# Patient Record
Sex: Male | Born: 1960 | Hispanic: Yes | Smoking: Never smoker
Health system: Southern US, Community
[De-identification: ages and names within clinical notes are randomized; demographics above are authoritative.]

## PROBLEM LIST (undated history)

## (undated) DIAGNOSIS — T8859XA Other complications of anesthesia, initial encounter: Secondary | ICD-10-CM

## (undated) DIAGNOSIS — R112 Nausea with vomiting, unspecified: Secondary | ICD-10-CM

## (undated) DIAGNOSIS — F32A Depression, unspecified: Secondary | ICD-10-CM

## (undated) DIAGNOSIS — E7800 Pure hypercholesterolemia, unspecified: Secondary | ICD-10-CM

## (undated) DIAGNOSIS — F419 Anxiety disorder, unspecified: Secondary | ICD-10-CM

## (undated) DIAGNOSIS — Z9889 Other specified postprocedural states: Secondary | ICD-10-CM

## (undated) DIAGNOSIS — F329 Major depressive disorder, single episode, unspecified: Secondary | ICD-10-CM

## (undated) DIAGNOSIS — I10 Essential (primary) hypertension: Secondary | ICD-10-CM

## (undated) DIAGNOSIS — M1990 Unspecified osteoarthritis, unspecified site: Secondary | ICD-10-CM

## (undated) DIAGNOSIS — K2970 Gastritis, unspecified, without bleeding: Secondary | ICD-10-CM

## (undated) DIAGNOSIS — R7303 Prediabetes: Secondary | ICD-10-CM

## (undated) DIAGNOSIS — K219 Gastro-esophageal reflux disease without esophagitis: Secondary | ICD-10-CM

## (undated) DIAGNOSIS — J40 Bronchitis, not specified as acute or chronic: Secondary | ICD-10-CM

## (undated) DIAGNOSIS — T4145XA Adverse effect of unspecified anesthetic, initial encounter: Secondary | ICD-10-CM

---

## 1999-03-01 ENCOUNTER — Encounter: Admission: RE | Admit: 1999-03-01 | Discharge: 1999-03-05 | Payer: Self-pay | Admitting: Family Medicine

## 2004-10-01 ENCOUNTER — Ambulatory Visit: Payer: Self-pay | Admitting: Family Medicine

## 2004-10-06 ENCOUNTER — Ambulatory Visit (HOSPITAL_COMMUNITY): Admission: RE | Admit: 2004-10-06 | Discharge: 2004-10-06 | Payer: Self-pay | Admitting: Family Medicine

## 2004-10-29 ENCOUNTER — Ambulatory Visit: Payer: Self-pay | Admitting: Family Medicine

## 2005-01-17 ENCOUNTER — Ambulatory Visit: Payer: Self-pay | Admitting: Family Medicine

## 2005-01-26 ENCOUNTER — Ambulatory Visit: Payer: Self-pay | Admitting: Family Medicine

## 2005-02-21 HISTORY — PX: ABDOMINAL SURGERY: SHX537

## 2005-03-11 ENCOUNTER — Ambulatory Visit: Payer: Self-pay | Admitting: Internal Medicine

## 2005-03-14 ENCOUNTER — Ambulatory Visit: Payer: Self-pay | Admitting: Internal Medicine

## 2005-03-14 ENCOUNTER — Ambulatory Visit (HOSPITAL_COMMUNITY): Admission: RE | Admit: 2005-03-14 | Discharge: 2005-03-14 | Payer: Self-pay | Admitting: Internal Medicine

## 2005-04-05 ENCOUNTER — Ambulatory Visit: Payer: Self-pay | Admitting: Family Medicine

## 2005-04-25 ENCOUNTER — Ambulatory Visit: Payer: Self-pay | Admitting: Internal Medicine

## 2005-05-05 ENCOUNTER — Ambulatory Visit: Payer: Self-pay | Admitting: Family Medicine

## 2005-09-02 ENCOUNTER — Ambulatory Visit: Payer: Self-pay | Admitting: Internal Medicine

## 2005-09-27 ENCOUNTER — Ambulatory Visit: Payer: Self-pay | Admitting: Internal Medicine

## 2005-09-27 ENCOUNTER — Ambulatory Visit (HOSPITAL_COMMUNITY): Admission: RE | Admit: 2005-09-27 | Discharge: 2005-09-27 | Payer: Self-pay | Admitting: Internal Medicine

## 2005-09-30 ENCOUNTER — Ambulatory Visit (HOSPITAL_COMMUNITY): Admission: RE | Admit: 2005-09-30 | Discharge: 2005-09-30 | Payer: Self-pay | Admitting: *Deleted

## 2005-10-06 ENCOUNTER — Ambulatory Visit (HOSPITAL_COMMUNITY): Admission: RE | Admit: 2005-10-06 | Discharge: 2005-10-06 | Payer: Self-pay | Admitting: Internal Medicine

## 2006-03-04 ENCOUNTER — Emergency Department (HOSPITAL_COMMUNITY): Admission: EM | Admit: 2006-03-04 | Discharge: 2006-03-04 | Payer: Self-pay | Admitting: Emergency Medicine

## 2006-03-05 ENCOUNTER — Ambulatory Visit (HOSPITAL_COMMUNITY): Admission: RE | Admit: 2006-03-05 | Discharge: 2006-03-05 | Payer: Self-pay | Admitting: Emergency Medicine

## 2006-04-20 ENCOUNTER — Ambulatory Visit: Payer: Self-pay | Admitting: Internal Medicine

## 2006-05-10 ENCOUNTER — Encounter (INDEPENDENT_AMBULATORY_CARE_PROVIDER_SITE_OTHER): Payer: Self-pay | Admitting: Specialist

## 2006-05-10 ENCOUNTER — Ambulatory Visit: Payer: Self-pay | Admitting: Internal Medicine

## 2006-05-10 ENCOUNTER — Ambulatory Visit (HOSPITAL_COMMUNITY): Admission: RE | Admit: 2006-05-10 | Discharge: 2006-05-10 | Payer: Self-pay | Admitting: Internal Medicine

## 2006-05-24 ENCOUNTER — Ambulatory Visit: Payer: Self-pay | Admitting: Internal Medicine

## 2006-06-16 ENCOUNTER — Ambulatory Visit: Payer: Self-pay | Admitting: Internal Medicine

## 2006-07-13 ENCOUNTER — Ambulatory Visit: Payer: Self-pay | Admitting: Internal Medicine

## 2006-07-18 ENCOUNTER — Ambulatory Visit (HOSPITAL_COMMUNITY): Admission: RE | Admit: 2006-07-18 | Discharge: 2006-07-18 | Payer: Self-pay | Admitting: Internal Medicine

## 2007-07-11 ENCOUNTER — Emergency Department (HOSPITAL_COMMUNITY): Admission: EM | Admit: 2007-07-11 | Discharge: 2007-07-12 | Payer: Self-pay | Admitting: Emergency Medicine

## 2007-07-11 ENCOUNTER — Ambulatory Visit: Payer: Self-pay | Admitting: Psychiatry

## 2007-07-12 ENCOUNTER — Inpatient Hospital Stay (HOSPITAL_COMMUNITY): Admission: RE | Admit: 2007-07-12 | Discharge: 2007-07-18 | Payer: Self-pay | Admitting: Psychiatry

## 2007-12-28 IMAGING — CT CT ABDOMEN W/ CM
3 of 7 series · 15 of 46 positions shown, 17 images · IV contrast (Omnipaque 300)
Comparison: None.

CLINICAL DATA: 45-year-old with right upper abdominal wall mass Pain for two weeks.
 CHEST CT WITH CONTRAST:
TECHNIQUE: Multidetector CT imaging of the chest was performed following the standard protocol during bolus administration of intravenous contrast.
 Contrast:  100 cc Omnipaque 300.
TECHNIQUE: Multidetector CT imaging of the abdomen was performed following the standard protocol during bolus administration of intravenous contrast.

[Series 2: chestroutine 5.0 b40f · axial · 0.74mm/px · z∈[-86,-52]mm · 2 of 48 slices shown]
[im 7/48  soft-tissue]
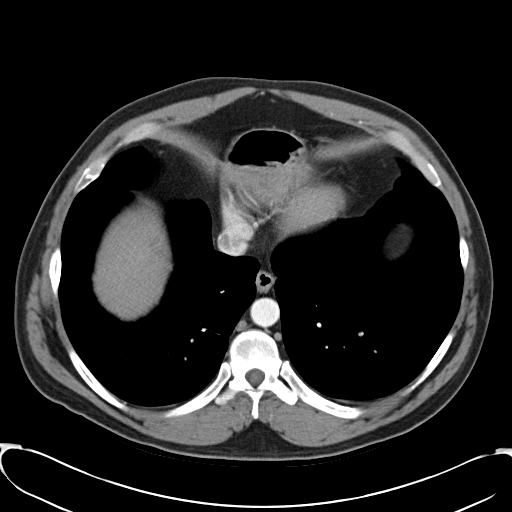
[im 14/48  soft-tissue]
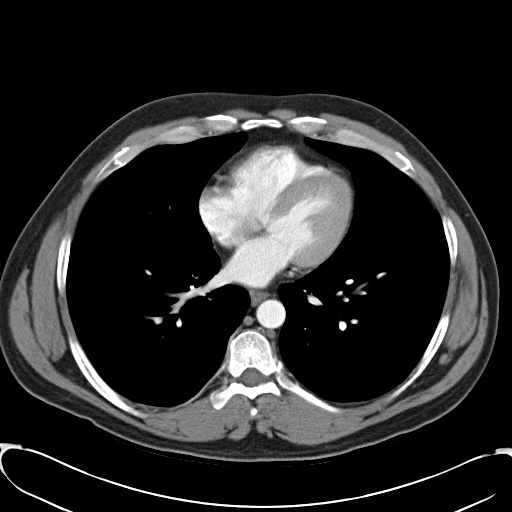

[Series 7: abd_pel 5.0 b40f · axial · 0.74mm/px · z∈[-282,-32]mm · 10 of 62 slices shown, 12 images]
[im 6/62  soft-tissue]
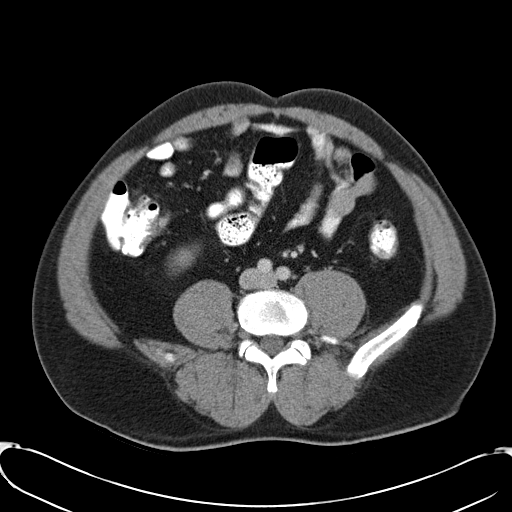
[im 6/62  bone]
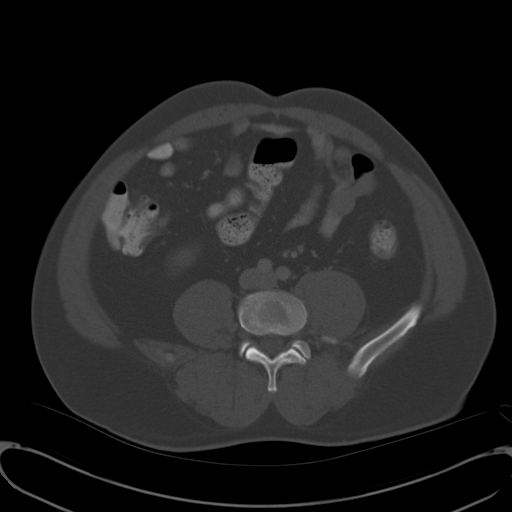
[im 12/62  soft-tissue]
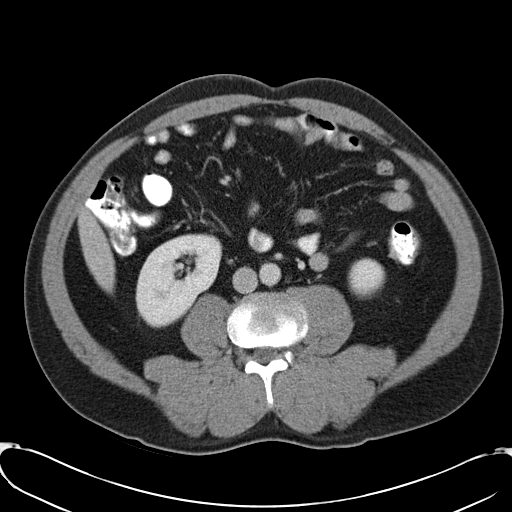
[im 17/62  soft-tissue]
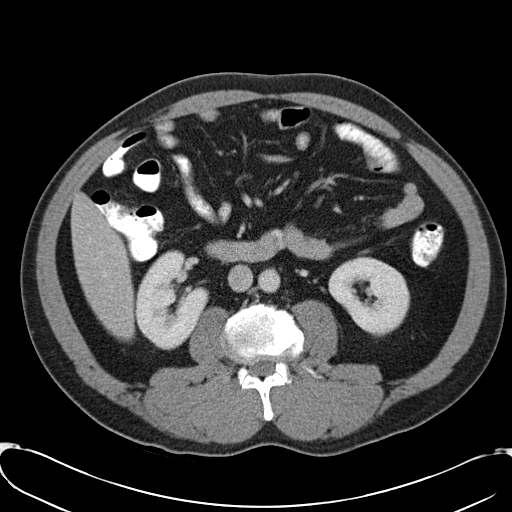
[im 23/62  soft-tissue]
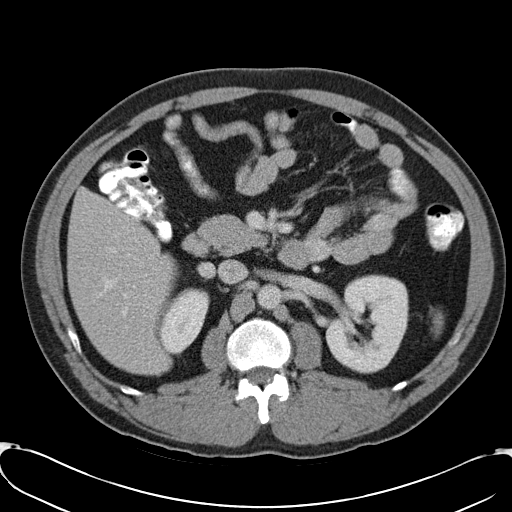
[im 28/62  soft-tissue]
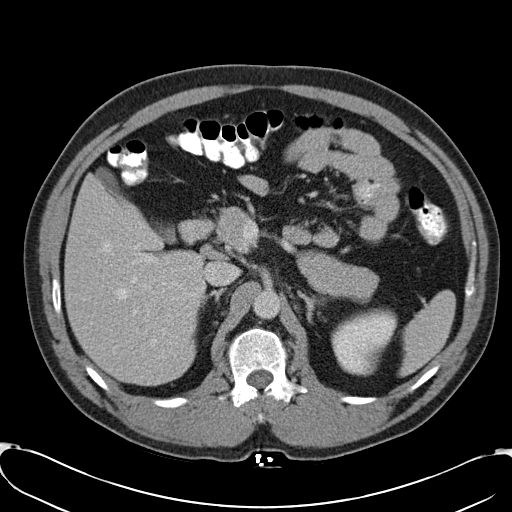
[im 34/62  soft-tissue]
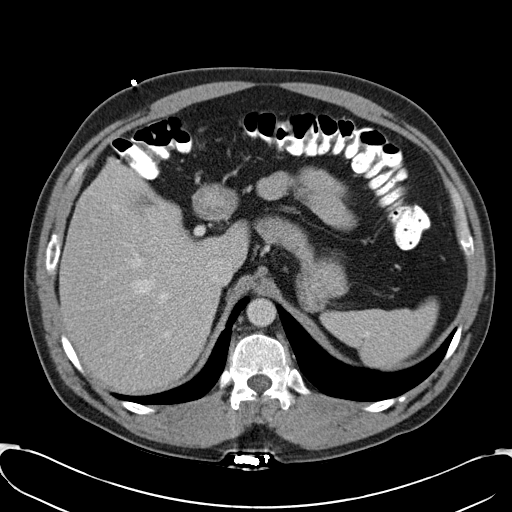
[im 39/62  soft-tissue]
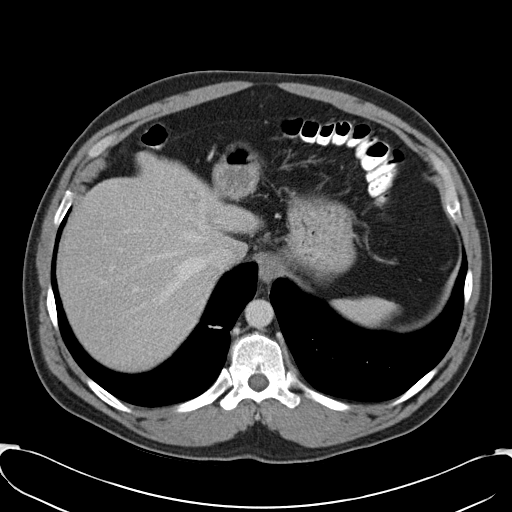
[im 45/62  soft-tissue]
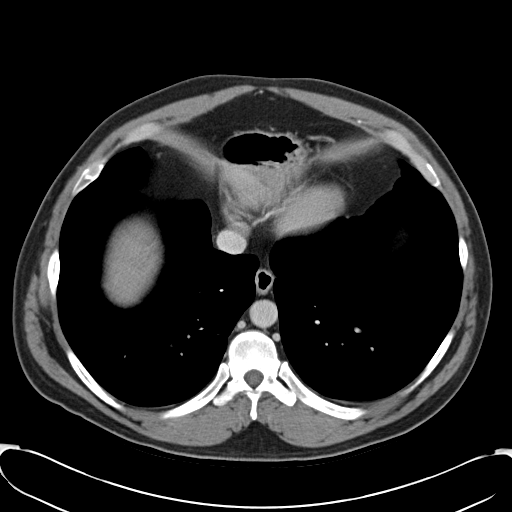
[im 50/62  soft-tissue]
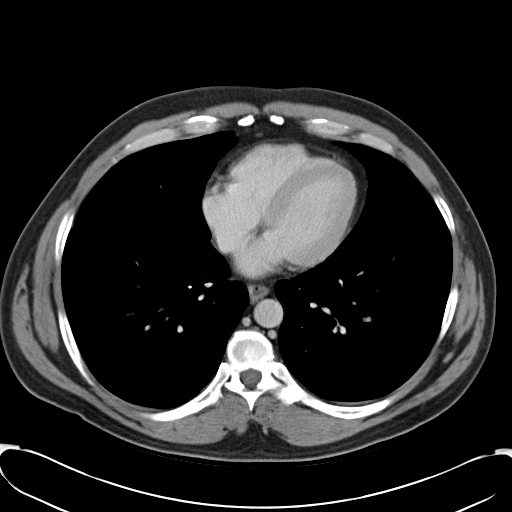
[im 50/62  bone]
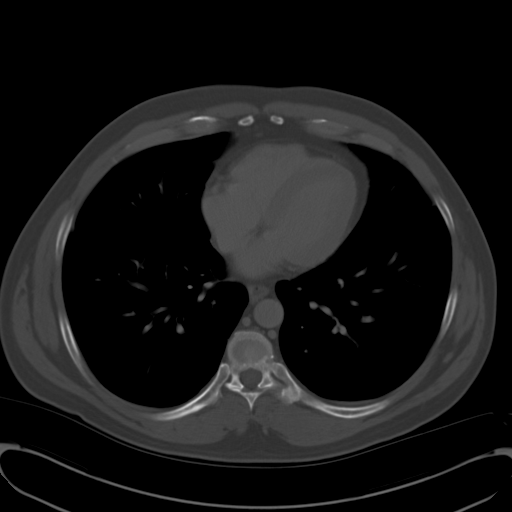
[im 56/62  soft-tissue]
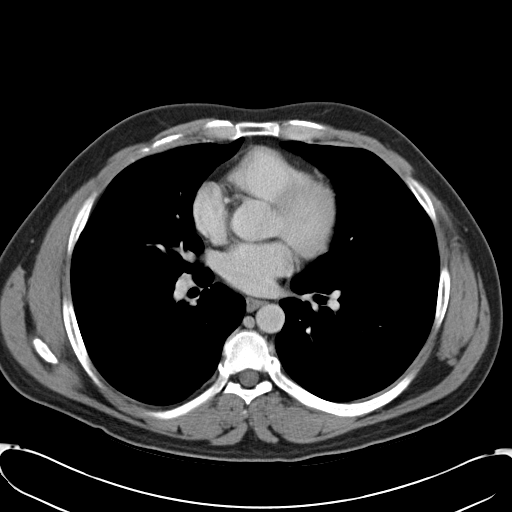

[Series 8: mpr coronal a/p · coronal · 0.60mm/px · 3 of 90 slices shown]
[im 23/90  soft-tissue]
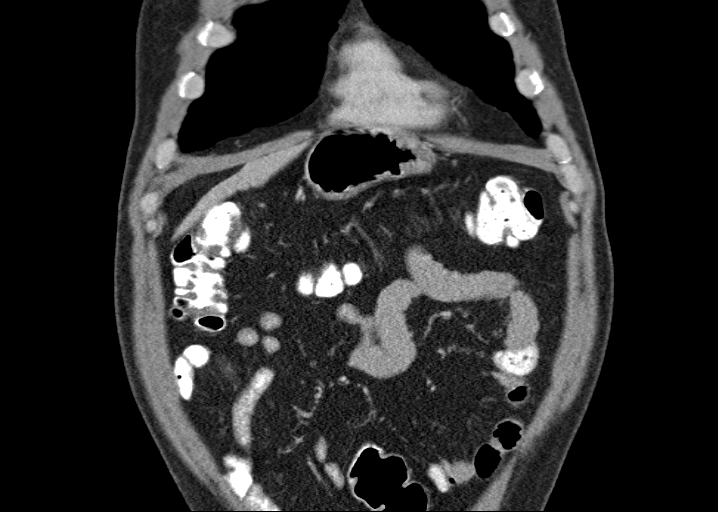
[im 45/90  soft-tissue]
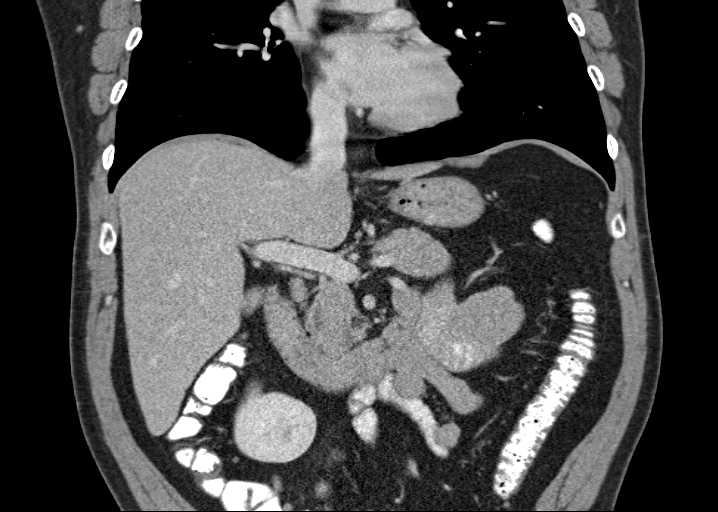
[im 67/90  soft-tissue]
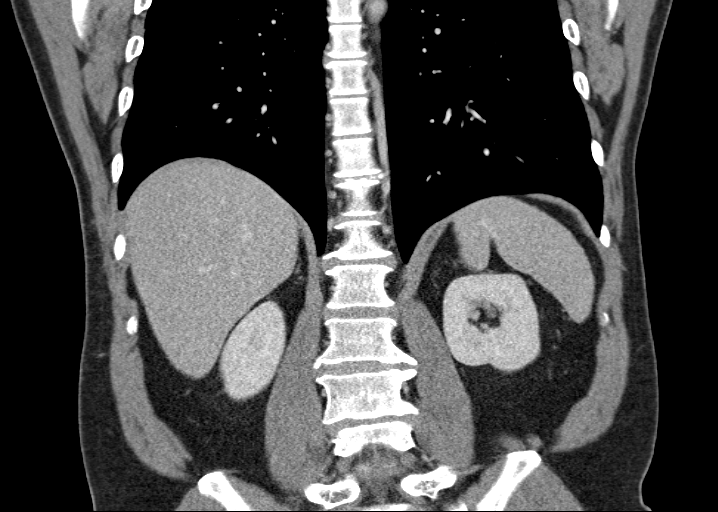

[15 of 46 positions shown; findings below may reference images not displayed]

FINDINGS: The chest wall, soft tissues, and bony structures are unremarkable. No supraclavicular or axillary adenopathy.
 Heart size is normal. No pericardial effusion. No mediastinal or hilar adenopathy. The esophagus is grossly normal. Examination of the lung parenchyma demonstrates no pulmonary masses or nodules. No acute pulmonary findings. No pleural effusions. Linear band of scarring noted at the right lung base. Tracheobronchial tree is unremarkable.
IMPRESSION: Unremarkable CT examination of the chest.
 ABDOMEN CT WITH CONTRAST:
FINDINGS: There is a BB marking the area of the patient?s palpable abnormality in the right upper quadrant. I do not see any underlying mass.  There is asymmetric subcutaneous fat without a discrete lipoma. The abdominal wall muscles appear normal. No hernia is seen. 
 The liver, spleen, pancreas, adrenal glands, and kidneys are unremarkable. The aorta is normal in caliber. No dissection. The major branch vessels are normal. 
 There are a few scattered mesenteric and retroperitoneal nodes, but no adenopathy. Small nodes in the periportal and celiac axis region are within normal limits. The gallbladder appears normal. The stomach is not well distended, but no gross abnormalities are seen. The duodenum, small bowel, and colon demonstrate no significant findings. The appendix is visualized and is normal. No significant bony findings.
IMPRESSION: 1.  No abdominal wall mass is seen. There is asymmetric subcutaneous fat in this area but no discrete lipoma. 
 2.  Unremarkable CT examination of the abdomen.

## 2010-07-06 NOTE — Discharge Summary (Signed)
NAMENICKOLAUS, BORDELON NO.:  [id number]   MEDICAL RECORD NO.:  [id number]          PATIENT TYPE:  IPS   LOCATION:  0406                          FACILITY:  [facility]   PHYSICIAN:  Antonietta Breach, M.D.  DATE OF BIRTH:  [birth year]   DATE OF ADMISSION:  07/12/2007  DATE OF DISCHARGE:  07/18/2007                               DISCHARGE SUMMARY   REFERRING PHYSICIAN:  Antonietta Breach, M.D.   REASON FOR ADMISSION:  Identifying data.   [name] Harper is a 50 year old male admitted to the [facility] on the 21st of May 2009 for psychosis.  The  patient had several days of being unable to eat or sleep.  He was having  catastrophic anxiety.  He was having voices telling him that they were  going to kill him.  He had been walking and wondering sometimes into the  woods.  He was also having somatic delusions.   The patient's wife had also reported that his hygiene had deteriorated  and that he was not showering regularly.   MEDICAL LABORATORY:  Unremarkable.   HOSPITAL COURSE:  [name] Harper was admitted to the inpatient psychiatric  unit and underwent milieu and group psychotherapy.  He was placed on  Celexa which was titrated to 20 mg every a.m.  He was placed on Seroquel  which was titrated 50 mg three times a day and 400 mg at bedtime.   The patient's mood progressively improved, his anxiety resolved, his  hallucinations and delusions resolved.  He became interactive and  socially appropriate within the milieu.  His sleep became normal and his  appetite returned to normal.  His grooming also returned to normal.   CONDITION ON DISCHARGE:  By the 27th of May, [name] Harper is socially  appropriate and cooperative.  He is not having any hallucinations or  delusions.  His sleep and appetite as well as mood are within normal  limits.  He has constructive future goals and interest.   MENTAL STATUS EXAMINATION AT DISCHARGE:  [name] Harper is alert, he is  oriented to all spheres.  His speech is within normal limits.  His  affect is slightly anxious at baseline but broad and appropriate  throughout the discussion.  His mood is within normal limits.  Thought  process, logical, coherent, goal directed.  No recent disassociations,  thought content.  No thoughts of harming himself.  No thoughts of  harming others, no delusions, no hallucinations.  He has intact hope.  Insight is good.  Judgment is intact.   AFTER CARE:  Please see the discharge instruction sheet.   DISCHARGE MEDICATIONS:  1. Seroquel 50 mg three times a day, 400 mg at bedtime.  2. Celexa 20 mg a day.   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  293.84 anxiety disorder not otherwise specified,  stable.  Axis II:  None.  Axis III:  Gastroesophageal reflux disease.  Axis IV:  Primary support group.  Axis V:  55.      Antonietta Breach, M.D.  Electronically  Signed     JW/MEDQ  D:  07/18/2007  T:  07/18/2007  Job:  [id number]   cc:   Antonietta Breach, M.D.

## 2010-07-06 NOTE — H&P (Signed)
NAME:  [name] Harper, [name] Harper NO.:  [id number]   MEDICAL RECORD NO.:  [id number]          PATIENT TYPE:  IPS   LOCATION:  0405                          FACILITY:  [facility]   PHYSICIAN:  Antonietta Breach, M.D.  DATE OF BIRTH:  Jul 25, 1960   DATE OF ADMISSION:  07/12/2007  DATE OF DISCHARGE:                       PSYCHIATRIC ADMISSION ASSESSMENT   DATE/TIME OF ASSESSMENT:  Jul 12, 2007, at 1330.   IDENTIFYING INFORMATION:  A 50 year old Hispanic male.  This is a  voluntary admission.   HISTORY OF PRESENT ILLNESS:  The first inpatient psychiatric admission  for this pleasant 50 year old who reports that he started hearing voices  in his head about 3 weeks ago.  For awhile they have been going on every  day, sometimes twice a day, and he is unable to eat or sleep, feels very  nervous and anxious and is asking for help.  He reports that the voices  tell him things like  they are going to kill him, that he needs to get  up and run, that he needs to walk in the streets and sometimes walk out  into the woods in the trees.  He feels that he is going crazy with these  voices.  Denies any past history of depression or hallucinations in the  past.  He reports that his problems began last October, when he was laid  off from his job of 15 years working at [employer] in [place],  [place].  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  This occurred 1 day prior  to his surgery for reflux disease at [facility] (possibly a Nissen  fundoplication).  Since that time, he has been more and more depressed,  not interested in activities, not interested in eating.  Sleeping  poorly.  Wife reports that his personal hygiene has deteriorated, that  he will not shower regularly when previously he would spend up to an  hour a day in the bathroom grooming and bathing.  He says he gets  anxious and feels that he cannot breathe at  times, sometimes gets  numbness and tingling in his fingers and toes.  He was recently  hospitalized 6 weeks ago with additional complaints of stomach ache and  nausea at [facility], who told him that the complaints they felt  were all in his head and could find no clinical basis for his  complaints.  He reports that he fears he is going to die although he  does not want to die.   PAST PSYCHIATRIC HISTORY:  No previous psychiatric care.  He has no  history of counseling.  He was hospitalized at [facility] for  recurrence of his GI complaints about 6 weeks ago and at that time was  started on Xanax 0.5 mg b.i.d., which he says has not helped him.  He  reports a history of alcohol abuse in the past, and his typical habit  was to drink three 40-ounce beers every single day.  He got into some  problems with the law 4 years ago.  Had some legal citations and decided  to quit completely.  He completed alcohol abuse classes at [facility] and has not had any alcohol since that time.  Denies any history of suicide attempts.   SOCIAL HISTORY:  Currently unemployed.  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  Currently lives in [place]  with his wife, Renea Ee.  He has  no children of his own but his wife does  have 2 adult children.  He has no current legal charges.   FAMILY HISTORY:  He reports family history negative for mental illness  or substance abuse.  The patient reports that his mother died in [place]  approximately 5 years ago and he does not return there anymore.  He has  been in the [place] for the past 27 years.  Also had a brother in  the [place] who was killed 12 years ago in a fall in a  construction accident when he fell 50 feet from a building.   MEDICAL HISTORY:  The patient's current care [name] Harper is the  gastroenterology clinic at [facility].  Current medical problems  are nausea and GERD.   Past medical history remarkable for GI surgery for  acid reflux in October 2008, possibly a Nissen fundoplication.   CURRENT MEDICATIONS:  1. Xanax 0.5 mg b.i.d.  2. Promethazine 25 mg q.6 h p.r.n. for nausea.   DRUG ALLERGIES:  None.   POSITIVE PHYSICAL FINDINGS:  Physical exam done in the [facility]  emergency room.   Urine drug screen was positive for benzodiazepines.  Alcohol level was  negative.  CT scan of his brain revealed no acute findings.  Chemistry  was unremarkable except for a slight decrease in potassium of 3.2.  CBC:  WBC is 8.0, hemoglobin 16.6, hematocrit 46.7, platelets 127,000, MCV  88.4.   MENTAL STATUS EXAM:  Fully alert male.  Pleasant, cooperative with  normal vital signs.  Speech is normal.  Affect is anxious.  He is  cooperative, pleasant, polite, composed, gives a coherent history,  oriented x3.  Concentration is decreased.  Thought processes logical and  coherent, goal directed.  Mood is anxious and depressed.  Thought  content reveals that he has been having some auditory hallucinations,  fears dying, does not really want to die, wants to feel better.  Is  asking for help.  Feels that the medication has not helped him.  Feels  that he is just not himself since he lost his job.  No active suicidal  thoughts today.  No homicidal thoughts.  Insight is decreased.  Judgment  impaired.  Impulse control within normal limits.   AXIS I:  Psychosis NOS.  Rule out major depression, anxiety disorder  NOS.  AXIS II:  Deferred.  AXIS III:  GERD with previous surgery.  AXIS IV:  Severe problems with unemployment.  Having a supportive wife  and a stable home situation is an asset to him.  AXIS V:  Current 20, past year not known.   The plan is to voluntarily admit the patient to alleviate his  hallucinations and lift his depression, alleviate his feelings of doom.  We are going to start him on Celexa 20 mg q.a.m. and Seroquel 100 mg  p.o. q.h.s. and are going to  try to get a family session with his wife.  He has been cooperative here with staff.  Estimated length  of stay is 5  days.      Margaret A. Lorin Picket, N.P.      Antonietta Breach, M.D.  Electronically Signed    MAS/MEDQ  D:  07/12/2007  T:  07/12/2007  Job:  [id number]

## 2010-07-06 NOTE — Assessment & Plan Note (Signed)
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  Last seen Jul 16, 2006.   Patient continues to have a chronic cough with globus sensation, throat-  clearing, and intermittent vomiting after incessant coughing spells.  Abnormal pH probe study corroborates reflux.  He is currently on a  regimen that includes Prevacid 30 mg orally b.i.d. as well as  hydrocodone cough syrup, which he is about out of.  He also takes  Clarinex 5 mg daily.  He threw up a small amount of blood nine days ago  but has not had any melena or similar symptoms since that time.  He is  slated to see the folks over at [facility] in the GI department  in early July of this year.  That is the earliest the appointment could  be obtained.  He has previously seen Dr. Annalee Harper of ENT because he has  reflux.   CURRENT MEDICATIONS:  See updated list.   ALLERGIES:  No known drug allergies.   PHYSICAL EXAMINATION:  GENERAL:  He appears at baseline, accompanied by  his wife.  VITAL SIGNS:  Weight 171.  Height 5 foot 4.  Temp 99, BP 120/78, pulse  88.  SKIN:  Warm and dry.  CHEST:  Lungs are clear to auscultation.  HEART:  Regular rate and rhythm.  No murmur, rub or gallop.  ABDOMEN:  Nondistended.  Positive bowel sounds.  He does have a bulge,  just under his right costal margin on the right side, he states it  popped up when he was heaving incessantly recently.  It is tender to  palpation.   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  Abdominal wall bulge with incessant coughing and  vomiting.  I doubt this is a hernia.  He may have a rectus hematoma or  other process related to extensive straining.   RECOMMENDATIONS:  1. Continue Prevacid 30 mg orally  b.i.d.  2. Keep appointment at [facility].  3. Add Tussionex 1 teaspoon b.i.d. to his regimen to help suppress      cough.  4. Will go ahead and get an abdominal chest CT to evaluate the above-      mentioned      abnormalities.  5. Telephone followup.       [name] Harper, M.D.  Electronically Signed     RMR/MEDQ  D:  07/13/2006  T:  07/13/2006  Job:  [id number]   cc:   [name] Harper

## 2010-07-09 NOTE — Procedures (Signed)
NAME:  [name], Harper NO.:  [id number]   MEDICAL RECORD NO.:  [id number]          PATIENT TYPE:  OUT   LOCATION:  RESPP                         FACILITY:  [facility]   PHYSICIAN:  Edward L. Juanetta Gosling, M.D.DATE OF BIRTH:  09/11/1960   DATE OF PROCEDURE:  09/30/2005  DATE OF DISCHARGE:  09/30/2005                              PULMONARY FUNCTION TEST   1. Spirometry is normal.  2. Lung volumes are normal.  3. DLCO is normal.  4. Arterial blood gases are in essence normal.  The pCO2 is slightly      elevated but pH is normal.  Oxygenation is normal.      Edward L. Juanetta Gosling, M.D.  Electronically Signed     ELH/MEDQ  D:  10/03/2005  T:  10/04/2005  Job:  [id number]   cc:   R. Roetta Sessions, M.D.  [address]  [place]  [place] [address]

## 2010-07-09 NOTE — Op Note (Signed)
NAME:  [name] Harper, [name] Harper                ACCOUNT NO.:  [id number]   MEDICAL RECORD NO.:  [id number]          PATIENT TYPE:  AMB   LOCATION:  DAY                           [facility]ACILITY:  [facility]   PHYSICIAN:  R. Roetta Sessions, M.D. DATE OF BIRTH:  01/15/1961   DATE OF PROCEDURE:  05/10/2006  DATE OF DISCHARGE:                               OPERATIVE REPORT   PROCEDURE:  Esophagogastroduodenoscopy with Bravo pH probe.   ENDOSCOPIST:  Jonathon Bellows, M.D.   INDICATIONS FOR PROCEDURE:  This 50 year old English-speaking Hispanic  male with chronic cough and phlegm build up in the back of his throat  and intermittent heaving.  His symptoms have not improved on a b.i.d.  Nexium for several months and AcipHex and Nexium at other times.  He is  not having any dysphagia.  A prior EGD in August 2007, demonstrated some  distal esophageal erosions and some mucosal petechial erosions.  The H.  pylori serology was positive and was treated.  The question at point in  time is whether or not his cough is reflux and associated or not.  Consequently he is going to be undergoing an EGD with a Bravo pH probe  placement.  He has not been on any acid suppression therapy in 10 days.  This  approach has been reviewed with the patient and with his wife at  length.  The potential risks, benefits and alternatives have been  reviewed.   DESCRIPTION OF PROCEDURE:  O2 saturation, blood pressure, pulse and  respirations were monitored throughout the entire procedure.  Conscious  sedation with Versed 4 mg IV and Demerol 75 mg IV in divided doses.  The  instrument was the Pentax video chip system.  Cetacaine spray for  topical pharyngeal anesthesia.   FINDINGS:  Examination of the tubular esophagus revealed a 2 cm distal  esophageal erosion.  The esophageal  mucosa otherwise appeared normal.  The EG junction was easily traversed.   STOMACH:  The gastric cavity was emptied and insufflated well with air.  A thorough  examination of the gastric mucosa,  On retroflexion the  proximal stomach and esophagogastric junction demonstrated a 3 mm polyp  in the cardia mucosa, otherwise the stomach appeared normal.  The  pylorus was patent and easily traversed.  Examination of the bulb and  second portion revealed no abnormalities.  The distance between the  incisors and the EG junction, both with the stomach insufflated and  deflated, was 37 cm.  The scope was withdrawn.  The Bravo deployment  device was advanced into the esophagus without difficulty.  The Bravo  probe was deployed at 31 cm from the incisors.  A look back with the  scope confirmed attachments to the mucosa without apparent complication.   The patient tolerated procedure well and was reactive in endoscopy.   IMPRESSION:  1. A solitary 2 cm distal esophageal erosion consistent with erosive      reflux esophagitis, otherwise normal esophagus.  2. A diminutive polyp in the cardia, essentially removed with cold      biopsy forceps technique.  The remainder  of the gastric mucosa      appeared normal.  Normal D-I and D-II.  3. Status post placement of Bravo pH probe as described above.   RECOMMENDATIONS:  1,  The patient undoubtedly has an element of  gastroesophageal reflux.  The question is whether or not reflux episodes  would correlate with a cough.  If this is found to be the case, I would  recommend he be referred for antireflux surgery. Further recommendations  to follow.  1. He has been admonished not to take any acid suppression medications      or antacids during the next 48 hours, during the study.      Jonathon Bellows, M.D.  Electronically Signed     RMR/MEDQ  D:  05/10/2006  T:  05/10/2006  Job:  [id number]   cc:   Onalee Hua L. Annalee Genta, M.D.  Fax: [phone]

## 2010-07-09 NOTE — Consult Note (Signed)
NAME:  [name] Harper, [name] Harper NO.:  [id number]   MEDICAL RECORD NO.:  [id number]           PATIENT TYPE:   LOCATION:                                 FACILITY:   PHYSICIAN:  R. Roetta Sessions, M.D. DATE OF BIRTH:  [birth year]   DATE OF CONSULTATION:  03/11/2005  DATE OF DISCHARGE:                                   CONSULTATION   REQUESTING PHYSICIAN:  Delaney Meigs, M.D.   REASON FOR CONSULTATION:  Chronic cough, gastroesophageal reflux disease.   HISTORY OF PRESENT ILLNESS:  [name] Harper is a 50 year old English-speaking  Hispanic male who presents today for further evaluation of a chronic cough  which has been treated as gastroesophageal reflux disease. He says he has  had it for about nine months. He has been on hydrocodone/guaifenesin  preparation for nine months, and it is the only thing that has been able to  control his cough thus far. He has also been on Zegerid for a while at 40 mg  daily. He wakes up every morning and coughs up phlegm. Lately, he has been  vomiting as well. Complains of some epigastric pain. He has a good appetite,  however. He has been able to eat just fine. No weight loss. He has had a lot  of indigestion. No heartburn, however. Bowel movements are regular. No  melena or rectal bleeding. No dysphagia or odynophagia. He has seasonal  allergies and states he cannot live without Allegra. He takes it daily.  Denies any post nasal drip. He had a chest x-ray in August 2006 which  revealed low lung volumes but otherwise unremarkable.   CURRENT MEDICATIONS:  1.  Hydrocodone/guaifenesin 1 teaspoon nightly p.r.n.  2.  Zegerid 40 mg daily.  3.  Allegra 180 mg daily.  4.  Aloe vera juice daily.   ALLERGIES:  No known drug allergies.   PAST MEDICAL HISTORY:  1.  Seasonal allergies.  2.  Chronic cough felt to be due to gastroesophageal reflux disease.   PAST SURGICAL HISTORY:  None.   FAMILY HISTORY:  Mother died at age 42 with fluid in her  lungs.   SOCIAL HISTORY:  He is married with no children. He is employed in [employer]. He denies any occupational exposures. He has never been a smoker. He  used to drink heavily but quit about a year ago.   REVIEW OF SYSTEMS:  GASTROINTESTINAL:  See HPI for GI. CONSTITUTIONAL:  No  weight loss. CARDIOPULMONARY:  No chest pain or shortness of breath.   PHYSICAL EXAMINATION:  VITAL SIGNS:  Weight 170, height 5 foot 4-1/2 inches,  temperature 98.2, blood pressure 140/90, pulse 80.  GENERAL:  Pleasant, well-developed, well-nourished, Hispanic male in no  acute distress.  SKIN:  Warm and dry. No jaundice.  HEENT:  Conjunctivae are pink. Sclerae are nonicteric. Oropharyngeal mucosa  are moist and pink. No lesions, erythema or exudate. No lymphadenopathy or  thyromegaly.  CHEST:  Lungs are clear to auscultation.  CARDIAC:  Reveals regular rate and rhythm. Normal S1 and S2. No murmurs,  rubs, or gallops.  ABDOMEN:  Positive bowel sounds. Soft, nondistended. He has mild epigastric  tenderness to deep palpation. No organomegaly or masses. No rebound  tenderness or guarding. No abdominal bruits or hernias.  EXTREMITIES:  No edema.   IMPRESSION:  [name] Harper is a 50 year old Hispanic male with a ninth month history  of chronic cough. He has indigestion type symptoms but no heartburn. He has  been on Zegerid chronically. He has also been on hydrocodone with  guaifenesin syrup for about nine months and is the only thing he has noted  that stops his cough. Now, he is having early morning vomiting mostly all  phlegm. He is having mild epigastric pain. It is not clear at this time  whether he is having laryngopharyngeal reflux although this is a  possibility. He has never had an upper endoscopy and recommend one at this  time.   PLAN:  EGD in the near future. If this unremarkable, he may need to have a  Bravo pH probe testing off of PPI therapy at some point in the future. Once  GI workup is done, if  there is still no improvement, he may need to have ENT  evaluation.   I would like to thank Dr. Lysbeth Galas for allowing [place] to take part in the care of  this patient.      Tana Coast, P.AJonathon Bellows, M.D.  Electronically Signed    LL/MEDQ  D:  03/11/2005  T:  03/11/2005  Job:  [id number]   cc:   Delaney Meigs, M.D.  Fax: [phone]

## 2010-07-09 NOTE — H&P (Signed)
[name], Harper                ACCOUNT NO.:  [id number]   MEDICAL RECORD NO.:  [id number]          PATIENT TYPE:  AMB   LOCATION:                                FACILITY:  [facility]   PHYSICIAN:  R. Roetta Sessions, M.D. DATE OF BIRTH:  1960-03-25   DATE OF ADMISSION:  04/20/2006  DATE OF DISCHARGE:  LH                              HISTORY & PHYSICAL   CHIEF COMPLAINT:  Cough, nausea.   [name] Harper is a 50 year old English-speaking Hispanic male who  comes today back to see me, accompanied with his wife, complaining of  recurrent episodes of early morning nausea and phlegm buildup at the  back of his throat.  He heaves but never really vomits anything.  He has  coughing associated with this.  He is not definitive as to whether or  not cough occurs primarily before or after the nausea and heaving  episodes, but they are associated.  The only thing that has ever  relieved his cough is narcotic cough suppressants.   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  He cancelled  his October 25, 2005 follow-up appointment with me.  I last saw him on  September 27, 2005.  He had just undergone an EEG back in January, 2007  which demonstrated a couple of distal esophageal erosions and some  diffuse gastric submucosal petechial erosions, hemorrhage.  H. pylori  serologies were positive.  He was treated with a pred pack.  He has been  on Zegerid, AcipHex, and Nexium.  He was on Zegerid 40 mg twice daily  for a few months and came off the agent.  More recently is taking Nexium  40 mg orally daily.  None of the above acid-suppressing agents seem to  affect his cough in any way.  He has been seen by Dr. Juanetta Gosling, who did  spirometry.  Everything turned out normal on that study.   Chest x-ray was clear.  He had some nipple shadows, which were confirmed  on a follow-up study.  He has seen Dr. Annalee Genta and Dr. Gerilyn Pilgrim  with  [facility].  He was felt to have a chronic globus sensation,  gastroesophageal reflux, deviated septum, and allergic rhinitis.  He was  also prescribed Flonase nasal spray and Allegra p.r.n.   He has not had any melena or rectal bleeding.  No hematemesis.  No  odynophagia.  No dysphagia.  He has GAINED 4 POUNDS since his last visit  here on September 27, 2005.  He is not taking any ACE inhibitors.   PAST MEDICAL HISTORY:  Significant for seasonal allergies, chronic  cough, history of erosive reflux esophagitis.   CURRENT MEDICATIONS:  1. Nexium 40 mg daily.  2. Isoniazid 300 mg at bedtime.  3. Carafate 1 gm b.i.d.  4. Herbal cough remedy.  5. Homeopathic tea remedy daily.   ALLERGIES:  No known drug allergies.   PAST SURGICAL HISTORY:  None.   FAMILY HISTORY:  Mother died with fluid on  her lungs, otherwise no  history of chronic GI or liver disease.   SOCIAL HISTORY:  Patient is married with no children.  He is employed  with [employer].  He denies any occupational exposures.  He used to  be a former heavy consumer of alcohol but has not consumed any alcohol  in two years.   REVIEW OF SYSTEMS:  Weight gain.  No fevers or chills.  No chest pain or  dyspnea.   PHYSICAL EXAMINATION:  GENERAL:  A pleasant 50 year old gentleman  resting comfortably.  Accompanied by his wife.  VITAL SIGNS:  Weight 171.  Height 5 feet 4.  Temp 99.7.  BP 158/90.  Pulse 88.  SKIN:  Warm and dry.  There is no jaundice.  HEENT:  No scleral icterus.  CHEST:  Lungs are clear to auscultation.  CARDIAC:  Regular rate and rhythm without murmur, rub or gallop.  ABDOMEN:  Nondistended.  Positive bowel sounds.  Soft, nontender,  without appreciable mass or organomegaly.  EXTREMITIES:  No edema.   IMPRESSION:  [name] Harper is a pleasant 50 year old English-speaking  Hispanic gentleman with recurring episodes of cough, phlegm buildup in  the back of his throat, which often leads to heaving  episodes.  He has  been fairly extensively evaluated by ENT and pulmonary medicine.  He did  have some additional esophageal erosions on an EGD one year ago.  He has  been treated aggressively with acid-suppression therapy and has not  really enjoyed significant improvement in his symptoms.  The etiology of  his cough is not clear.  I feel it is only remotely likely that cough is  related to reflux; however, that being said, I think we should go ahead  and study him further to determine whether or not there is a  relationship between reflux episodes and cough.   RECOMMENDATIONS:  Stop Nexium and all acid-suppressive agents.  No  sooner than 10 days from now, we will schedule an EGD with Bravo pH  probe placement.  Potential risks, benefits and alternatives to this  approach have been reviewed with Mr. Ginsberg and his wife.  We will make  further recommendations once data is available for review.  If this  study fails to show gastroesophageal reflux, then I feel the next best  step would be to refer this nice gentleman to a medical center such as  [facility] for further evaluation.      Jonathon Bellows, M.D.  Electronically Signed     RMR/MEDQ  D:  04/20/2006  T:  04/20/2006  Job:  [id number]   cc:   [place] Parma  Fax: [phone]

## 2010-07-09 NOTE — Op Note (Signed)
NAME:  [name] Harper, [name] Harper                ACCOUNT NO.:  [id number]   MEDICAL RECORD NO.:  [id number]          PATIENT TYPE:  AMB   LOCATION:  DAY                           FACILITY:  [facility]   PHYSICIAN:  R. Roetta Sessions, M.D. DATE OF BIRTH:  [birth year]   DATE OF PROCEDURE:  05/10/2006  DATE OF DISCHARGE:  05/10/2006                               OPERATIVE REPORT   PROCEDURE:  Bravo pH study.   INDICATIONS:  A 50 year old gentleman with phlegm buildup and incessant  cough.  No response to Nexium and Aciphex over the past several months.  Had been taken at different times.  Bravo pH study was done off acid  suppression therapy (see 05/10/2006, EGD report).   FINDINGS:  Day #1, number of reflux episodes:  60.  Duration of longest reflux episode in minutes:  7.  Time pH less than 4.0 (min):  54.  Fraction of total time pH below 4.0:  4.3%.  DeMeester score (DeMeester normals <14.7 95th percentile):  16.5, with  DeMeester normals less than 14.72.   On day #2, number of reflux episodes:  73.  Duration of longest reflux episode in minutes:  94.  Time pH less than 4.0 (min):  121.  Fraction of total time pH below 4.0:  8.8%.  Total score:  37.5, with DeMeester less than 14.72.   In reviewing the patient's diary during the study, he had certainly more  episodes of reflux on day #2.  More were associated with the supine  position and the 3 episodes of coughing, for which he took Hycodan  during day #2, were fairly well correlated with the episodes of  gastroesophageal reflux.   ASSESSMENT:  Abnormal pH probe study, with excessive gastroesophageal  reflux, fairly good correlation with episodes of cough and reflux.   No doubt he has gastroesophageal reflux.  In fact, it is complicated  given the fact he was found to have erosive reflux esophagitis on  esophagogastroduodenoscopy.  I am somewhat surprise he has not derived  more improvement with the acid suppression therapy.   This is a  gentleman who may ultimately benefit more from the creation of  a mechanical barrier to reflux via laparoscopic anti-reflux surgery in  the near future.  I would like to see his symptoms controlled on acid  suppression prior to further contemplation of a surgical referral as a  long-term treatment maneuver.   RECOMMENDATIONS:  Will start him on an aggressive acid suppression  regimen in the way of Prevacid 30 mg orally t.i.d.  Will explain to the  patient it may take a good 3 to 6 months to appreciate significant  improvement on this regimen.  Will plan to see this nice gentleman back  in 3 months.      [name] Harper, M.D.  Electronically Signed     RMR/MEDQ  D:  05/24/2006  T:  05/24/2006  Job:  [id number]   cc:   Onalee Hua L. Annalee Genta, M.D.  Fax: [phone]   [place] Parma  Fax: [phone]

## 2010-07-09 NOTE — Op Note (Signed)
NAME:  [name] Harper, [name] Harper                ACCOUNT NO.:  [id number]   MEDICAL RECORD NO.:  [id number]          PATIENT TYPE:  AMB   LOCATION:  DAY                           FACILITY:  [facility]   PHYSICIAN:  R. Roetta Sessions, M.D. DATE OF BIRTH:  1960-05-11   DATE OF PROCEDURE:  03/14/2005  DATE OF DISCHARGE:                                 OPERATIVE REPORT   INDICATIONS FOR PROCEDURE:  The patient is a 50 year old, Hispanic gentleman  with 70-month history of chronic cough.  He had some reflux type symptoms and  some vague upper abdominal pain consistent with dyspepsia.  Zegerid has not  made much impact to the above-mentioned symptoms.  EEG is now being done.  Discussed with patient at length potential risks, benefits and alternatives  having been reviewed on consultation on March 11, 2005.   O2 saturations, blood pressures and pulses were monitored at the time of  procedure.   ANESTHESIA:  Conscious sedation with Versed 3 mg IV and Demerol 75 mg IV in  divided doses.   INSTRUMENT USED:  Olympus video system.   FINDINGS:  Esophagus revealed tiny distal esophageal erosions at the EG  junction.  There was no evidence of esophageal mucosal abnormality.  EG  junction was easily traversed into stomach and gastric cavity was empty.  Insufflated well with air.  Gastric mucosa was retroflexed through the  proximal stomach and esophagogastric junction was undertaken.  The patient  had a small hiatal hernia.  Diffuse submucosal petechiae and multiple antral  erosions.  There was no infiltrating process or frank ulcer.  The pylorus  was patent and easily traversed.  Examination of bulb on second portion  revealed again bulbar erosions, but no frank ulcer, otherwise D1 and D2  appeared normal.   THERAPEUTIC/DIAGNOSTIC MANEUVERS PERFORMED:  None.   The patient tolerated the procedure well.   IMPRESSION:  Distal esophageal erosions consistent with mild erosive reflux  esophagitis, otherwise normal  esophagus.  Small hiatal hernia.  Diffuse  gastric submucosa petechiae.  Antral and bulbar erosions, otherwise stomach  looked okay as did D1 and D2.   RECOMMENDATIONS:  1.  Increase Zegerid to 40 mg orally twice daily.  2.  H. pylori serologies to be drawn today.  3.  We are going to take the liberty of referring him to Dr. Alita Chyle,      [facility], to further evaluate his history of incessant coughing      and throat clearing.  Symptoms may or may not be related to      gastroesophageal      reflux disease, although a component of his symptoms certainly could be      at this point.  4.  Will see if he gets a clinical response to bumping his PPI to b.i.d.   I will make further recommendations in the near future.      Jonathon Bellows, M.D.  Electronically Signed     RMR/MEDQ  D:  03/14/2005  T:  03/15/2005  Job:  [id number]   cc:   Delaney Meigs, M.D.  Fax: [phone]

## 2010-11-17 LAB — BASIC METABOLIC PANEL
CO2: 26
Calcium: 10
Chloride: 103
Creatinine, Ser: 0.8
GFR calc Af Amer: >60
GFR calc non Af Amer: >60
Potassium: 3.2 — ABNORMAL LOW

## 2010-11-17 LAB — DIFFERENTIAL
Basophils Relative: 0
Lymphocytes Relative: 32
Lymphs Abs: 2.6
Monocytes Absolute: 0.7
Monocytes Relative: 9
Neutrophils Relative %: 58

## 2010-11-17 LAB — T4, FREE: Free T4: 1.26

## 2010-11-17 LAB — CBC
HCT: 46.7
RBC: 5.28
RDW: 13.2

## 2010-11-17 LAB — ETHANOL: Alcohol, Ethyl (B): <5

## 2010-11-17 LAB — TSH: TSH: 1.594

## 2010-11-17 LAB — RAPID URINE DRUG SCREEN, HOSP PERFORMED: Amphetamines: NOT DETECTED

## 2014-02-13 ENCOUNTER — Emergency Department (HOSPITAL_COMMUNITY)
Admission: EM | Admit: 2014-02-13 | Discharge: 2014-02-13 | Disposition: A | Discharge status: Home / Self Care | Payer: Medicare Other | Attending: Emergency Medicine | Admitting: Emergency Medicine

## 2014-02-13 ENCOUNTER — Emergency Department (HOSPITAL_COMMUNITY): Payer: Medicare Other

## 2014-02-13 ENCOUNTER — Encounter (HOSPITAL_COMMUNITY): Payer: Self-pay | Admitting: *Deleted

## 2014-02-13 DIAGNOSIS — R05 Cough: Secondary | ICD-10-CM

## 2014-02-13 DIAGNOSIS — I10 Essential (primary) hypertension: Secondary | ICD-10-CM | POA: Diagnosis not present

## 2014-02-13 DIAGNOSIS — J40 Bronchitis, not specified as acute or chronic: Secondary | ICD-10-CM

## 2014-02-13 DIAGNOSIS — E780 Pure hypercholesterolemia: Secondary | ICD-10-CM | POA: Insufficient documentation

## 2014-02-13 DIAGNOSIS — Z79899 Other long term (current) drug therapy: Secondary | ICD-10-CM | POA: Insufficient documentation

## 2014-02-13 DIAGNOSIS — R059 Cough, unspecified: Secondary | ICD-10-CM

## 2014-02-13 DIAGNOSIS — J209 Acute bronchitis, unspecified: Secondary | ICD-10-CM | POA: Diagnosis not present

## 2014-02-13 DIAGNOSIS — Z7952 Long term (current) use of systemic steroids: Secondary | ICD-10-CM | POA: Insufficient documentation

## 2014-02-13 DIAGNOSIS — Z792 Long term (current) use of antibiotics: Secondary | ICD-10-CM | POA: Insufficient documentation

## 2014-02-13 DIAGNOSIS — Z791 Long term (current) use of non-steroidal anti-inflammatories (NSAID): Secondary | ICD-10-CM | POA: Diagnosis not present

## 2014-02-13 HISTORY — DX: Essential (primary) hypertension: I10

## 2014-02-13 HISTORY — DX: Pure hypercholesterolemia, unspecified: E78.00

## 2014-02-13 HISTORY — DX: Bronchitis, not specified as acute or chronic: J40

## 2014-02-13 MED ORDER — PREDNISONE 50 MG PO TABS
60.0000 mg | ORAL_TABLET | Freq: Once | ORAL | Status: AC
  Administered 2014-02-13: 60 mg via ORAL
  Filled 2014-02-13 (×2): qty 1

## 2014-02-13 MED ORDER — IPRATROPIUM BROMIDE 0.02 % IN SOLN: 0.5000 mg | Freq: Once | RESPIRATORY_TRACT | Status: DC

## 2014-02-13 MED ORDER — ALBUTEROL SULFATE (2.5 MG/3ML) 0.083% IN NEBU: 5.0000 mg | INHALATION_SOLUTION | Freq: Once | RESPIRATORY_TRACT | Status: DC

## 2014-02-13 MED ORDER — IPRATROPIUM-ALBUTEROL 0.5-2.5 (3) MG/3ML IN SOLN
3.0000 mL | Freq: Once | RESPIRATORY_TRACT | Status: AC
  Administered 2014-02-13: 3 mL via RESPIRATORY_TRACT
  Filled 2014-02-13: qty 3

## 2014-02-13 MED ORDER — AZITHROMYCIN 250 MG PO TABS: 250.0000 mg | ORAL_TABLET | Freq: Every day | ORAL | Status: DC

## 2014-02-13 MED ORDER — PREDNISONE 10 MG PO TABS: 20.0000 mg | ORAL_TABLET | Freq: Every day | ORAL | Status: DC

## 2014-02-13 MED ORDER — ALBUTEROL SULFATE (2.5 MG/3ML) 0.083% IN NEBU
2.5000 mg | INHALATION_SOLUTION | Freq: Once | RESPIRATORY_TRACT | Status: AC
  Administered 2014-02-13: 2.5 mg via RESPIRATORY_TRACT
  Filled 2014-02-13: qty 3

## 2014-02-13 MED ORDER — ALBUTEROL SULFATE HFA 108 (90 BASE) MCG/ACT IN AERS
2.0000 | INHALATION_SPRAY | RESPIRATORY_TRACT | Status: DC | PRN
  Administered 2014-02-13: 2 via RESPIRATORY_TRACT
  Filled 2014-02-13: qty 6.7

## 2014-02-13 NOTE — Discharge Instructions (Signed)
Follow up next week if not improving. °

## 2014-02-13 NOTE — ED Provider Notes (Signed)
CSN: [id number]     Arrival date & time 02/13/14  1711 History   First MD Initiated Contact with Patient 02/13/14 1724     Chief Complaint  Patient presents with  . Cough     (Consider location/radiation/quality/duration/timing/severity/associated sxs/prior Treatment) Patient is a 53 y.o. male presenting with cough. The history is provided by the patient (the pt complains of a cough for  a week.  not improving. with augmentin).  Cough Cough characteristics:  Non-productive and hacking Severity:  Moderate Onset quality:  Gradual Timing:  Constant Progression:  Worsening Chronicity:  New Smoker: no   Context: animal exposure   Associated symptoms: no chest pain, no eye discharge, no headaches and no rash     Past Medical History  Diagnosis Date  . Bronchitis   . Hypertension   . Hypercholesterolemia    Past Surgical History  Procedure Laterality Date  . Abdominal surgery     History reviewed. No pertinent family history. History  Substance Use Topics  . Smoking status: Never Smoker   . Smokeless tobacco: Not on file  . Alcohol Use: No    Review of Systems  Constitutional: Negative for appetite change and fatigue.  HENT: Negative for congestion, ear discharge and sinus pressure.   Eyes: Negative for discharge.  Respiratory: Positive for cough.   Cardiovascular: Negative for chest pain.  Gastrointestinal: Negative for abdominal pain and diarrhea.  Genitourinary: Negative for frequency and hematuria.  Musculoskeletal: Negative for back pain.  Skin: Negative for rash.  Neurological: Negative for seizures and headaches.  Psychiatric/Behavioral: Negative for hallucinations.      Allergies  Review of patient's allergies indicates no known allergies.  Home Medications   Prior to Admission medications   Medication Sig Start Date End Date Taking? Authorizing Provider  amoxicillin-clavulanate (AUGMENTIN) 875-125 MG per tablet Take 1 tablet by mouth 2 (two) times  daily. 02/10/2014 02/10/14  Yes Historical Provider, MD  azelastine (ASTELIN) 0.1 % nasal spray Place 2 sprays into both nostrils 2 (two) times daily. 02/10/14  Yes Historical Provider, MD  citalopram (CELEXA) 20 MG tablet Take 20 mg by mouth 2 (two) times daily. 02/10/14  Yes Historical Provider, MD  clonazePAM (KLONOPIN) 0.5 MG tablet Take 0.5 mg by mouth every 8 (eight) hours as needed. anxiety 02/10/14  Yes Historical Provider, MD  hydrochlorothiazide (HYDRODIURIL) 25 MG tablet Take 25 mg by mouth daily. 11/25/13  Yes Historical Provider, MD  montelukast (SINGULAIR) 10 MG tablet Take 10 mg by mouth every evening. 02/10/14  Yes Historical Provider, MD  naproxen (NAPROSYN) 500 MG tablet Take 500 mg by mouth 2 (two) times daily. 01/29/14  Yes Historical Provider, MD  NASONEX 50 MCG/ACT nasal spray Place 2 sprays into both nostrils daily. 02/10/14  Yes Historical Provider, MD  simvastatin (ZOCOR) 20 MG tablet Take 20 mg by mouth at bedtime. 11/12/13  Yes Historical Provider, MD  azithromycin (ZITHROMAX) 250 MG tablet Take 1 tablet (250 mg total) by mouth daily. Take first 2 tablets together, then 1 every day until finished. 02/13/14   Maudry Diego, MD  predniSONE (DELTASONE) 10 MG tablet Take 2 tablets (20 mg total) by mouth daily. 02/13/14   Maudry Diego, MD   BP 144/94 mmHg  Pulse 118  Temp(Src) 99.1 F (37.3 C) (Oral)  Resp 18  Ht 5\' 2"  (1.575 m)  Wt 185 lb (83.915 kg)  BMI 33.83 kg/m2  SpO2 97% Physical Exam  Constitutional: He is oriented to person, place, and time. He appears  well-developed.  HENT:  Head: Normocephalic.  Eyes: Conjunctivae and EOM are normal. No scleral icterus.  Neck: Neck supple. No thyromegaly present.  Cardiovascular: Normal rate and regular rhythm.  Exam reveals no gallop and no friction rub.   No murmur heard. Pulmonary/Chest: No stridor. He has wheezes. He has no rales. He exhibits no tenderness.  Abdominal: He exhibits no distension. There is no  tenderness. There is no rebound.  Musculoskeletal: Normal range of motion. He exhibits no edema.  Lymphadenopathy:    He has no cervical adenopathy.  Neurological: He is oriented to person, place, and time. He exhibits normal muscle tone. Coordination normal.  Skin: No rash noted. No erythema.  Psychiatric: He has a normal mood and affect. His behavior is normal.    ED Course  Procedures (including critical care time) Labs Review Labs Reviewed - No data to display  Imaging Review Dg Chest 2 View  02/13/2014   CLINICAL DATA:  Cough.  EXAM: CHEST  2 VIEW  COMPARISON:  09/27/2005  FINDINGS: Lungs are hypoinflated with mild prominence of the vascular markings due to the degree of hypoinflation. There is no focal consolidation or effusion. Cardiomediastinal silhouette and remainder of the exam is unchanged.  IMPRESSION: Hypoinflation without acute cardiopulmonary disease.   Electronically Signed   By: Marin Olp M.D.   On: 02/13/2014 18:07     EKG Interpretation None      MDM   Final diagnoses:  Cough  Bronchitis    Continued bronchitis.   tx with zpack,  Prednisone and proventil    Maudry Diego, MD 02/13/14 [phone]

## 2014-02-13 NOTE — ED Notes (Addendum)
Cough, feels weak , fever, Seen by his md and dx with bronchitis. , white sputum. Sore throat.  Vomiting last night , x2  And x1 today, no diarrhea.    Taking augmentin,  Has a headache,

## 2014-02-13 NOTE — ED Notes (Signed)
Pt cont to cough frequently

## 2014-02-21 HISTORY — PX: KNEE ARTHROSCOPY: SUR90

## 2014-09-18 ENCOUNTER — Other Ambulatory Visit: Payer: Self-pay | Admitting: Surgical

## 2014-09-18 MED ORDER — BUPIVACAINE LIPOSOME 1.3 % IJ SUSP: 20.0000 mL | Freq: Once | INTRAMUSCULAR | Status: AC

## 2014-09-25 ENCOUNTER — Other Ambulatory Visit: Payer: Self-pay | Admitting: Surgical

## 2014-10-20 NOTE — Patient Instructions (Addendum)
YOUR PROCEDURE IS SCHEDULED ON :  10/29/14  REPORT TO [facility] MAIN ENTRANCE FOLLOW SIGNS TO EAST ELEVATOR - GO TO 3rd FLOOR  CHECK IN AT 3 EAST NURSES STATION (SHORT STAY) AT:  6:30 AM  CALL THIS NUMBER IF YOU HAVE PROBLEMS THE MORNING OF SURGERY [phone]  REMEMBER:ONLY 1 PER PERSON MAY GO TO SHORT STAY WITH YOU TO GET READY THE MORNING OF YOUR SURGERY  DO NOT EAT FOOD OR DRINK LIQUIDS AFTER MIDNIGHT  TAKE THESE MEDICINES THE MORNING OF SURGERY:  CITALOPRAM / CLONAZEPAM / SINGULAIR / MAY USE NASAL SPRAYS  YOU MAY NOT HAVE ANY METAL ON YOUR BODY INCLUDING HAIR PINS AND PIERCING'S. DO NOT WEAR JEWELRY, MAKEUP, LOTIONS, POWDERS OR PERFUMES. DO NOT WEAR NAIL POLISH. DO NOT SHAVE 48 HRS PRIOR TO SURGERY. MEN MAY SHAVE FACE AND NECK.  DO NOT [place]. [facility] IS NOT RESPONSIBLE FOR VALUABLES.  CONTACTS, DENTURES OR PARTIALS MAY NOT BE WORN TO SURGERY. LEAVE SUITCASE IN CAR. CAN BE BROUGHT TO ROOM AFTER SURGERY.  PATIENTS DISCHARGED THE DAY OF SURGERY WILL NOT BE ALLOWED TO DRIVE HOME.  PLEASE READ OVER THE FOLLOWING INSTRUCTION SHEETS _________________________________________________________________________________                                           - PREPARING FOR SURGERY  Before surgery, you can play an important role.  Because skin is not sterile, your skin needs to be as free of germs as possible.  You can reduce the number of germs on your skin by washing with CHG (chlorahexidine gluconate) soap before surgery.  CHG is an antiseptic cleaner which kills germs and bonds with the skin to continue killing germs even after washing. Please DO NOT use if you have an allergy to CHG or antibacterial soaps.  If your skin becomes reddened/irritated stop using the CHG and inform your nurse when you arrive at Short Stay. Do not shave (including legs and underarms) for at least 48 hours prior to the first CHG shower.  You may shave  your face. Please follow these instructions carefully:   1.  Shower with CHG Soap the night before surgery and the  morning of Surgery.   2.  If you choose to wash your hair, wash your hair first as usual with your  normal  Shampoo.   3.  After you shampoo, rinse your hair and body thoroughly to remove the  shampoo.                                         4.  Use CHG as you would any other liquid soap.  You can apply chg directly  to the skin and wash . Gently wash with scrungie or clean wascloth    5.  Apply the CHG Soap to your body ONLY FROM THE NECK DOWN.   Do not use on open                           Wound or open sores. Avoid contact with eyes, ears mouth and genitals (private parts).  Genitals (private parts) with your normal soap.              6.  Wash thoroughly, paying special attention to the area where your surgery  will be performed.   7.  Thoroughly rinse your body with warm water from the neck down.   8.  DO NOT shower/wash with your normal soap after using and rinsing off  the CHG Soap .                9.  Pat yourself dry with a clean towel.             10.  Wear clean night clothes to bed after shower             11.  Place clean sheets on your bed the night of your first shower and do not  sleep with pets.  Day of Surgery : Do not apply any lotions/deodorants the morning of surgery.  Please wear clean clothes to the hospital/surgery center.  FAILURE TO FOLLOW THESE INSTRUCTIONS MAY RESULT IN THE CANCELLATION OF YOUR SURGERY    PATIENT SIGNATURE_________________________________  ______________________________________________________________________     Adam Phenix  An incentive spirometer is a tool that can help keep your lungs clear and active. This tool measures how well you are filling your lungs with each breath. Taking long deep breaths may help reverse or decrease the chance of developing breathing (pulmonary) problems  (especially infection) following:  A long period of time when you are unable to move or be active. BEFORE THE PROCEDURE   If the spirometer includes an indicator to show your best effort, your nurse or respiratory therapist will set it to a desired goal.  If possible, sit up straight or lean slightly forward. Try not to slouch.  Hold the incentive spirometer in an upright position. INSTRUCTIONS FOR USE   Sit on the edge of your bed if possible, or sit up as far as you can in bed or on a chair.  Hold the incentive spirometer in an upright position.  Breathe out normally.  Place the mouthpiece in your mouth and seal your lips tightly around it.  Breathe in slowly and as deeply as possible, raising the piston or the ball toward the top of the column.  Hold your breath for 3-5 seconds or for as long as possible. Allow the piston or ball to fall to the bottom of the column.  Remove the mouthpiece from your mouth and breathe out normally.  Rest for a few seconds and repeat Steps 1 through 7 at least 10 times every 1-2 hours when you are awake. Take your time and take a few normal breaths between deep breaths.  The spirometer may include an indicator to show your best effort. Use the indicator as a goal to work toward during each repetition.  After each set of 10 deep breaths, practice coughing to be sure your lungs are clear. If you have an incision (the cut made at the time of surgery), support your incision when coughing by placing a pillow or rolled up towels firmly against it. Once you are able to get out of bed, walk around indoors and cough well. You may stop using the incentive spirometer when instructed by your caregiver.  RISKS AND COMPLICATIONS  Take your time so you do not get dizzy or light-headed.  If you are in pain, you may need to take or ask for pain medication before doing incentive spirometry. It is  harder to take a deep breath if you are having pain. AFTER  USE  Rest and breathe slowly and easily.  It can be helpful to keep track of a log of your progress. Your caregiver can provide you with a simple table to help with this. If you are using the spirometer at home, follow these instructions: [place] IF:   You are having difficultly using the spirometer.  You have trouble using the spirometer as often as instructed.  Your pain medication is not giving enough relief while using the spirometer.  You develop fever of 100.5 F (38.1 C) or higher. SEEK IMMEDIATE MEDICAL CARE IF:   You cough up bloody sputum that had not been present before.  You develop fever of 102 F (38.9 C) or greater.  You develop worsening pain at or near the incision site. MAKE SURE YOU:   Understand these instructions.  Will watch your condition.  Will get help right away if you are not doing well or get worse. Document Released: 06/20/2006 Document Revised: 05/02/2011 Document Reviewed: 08/21/2006 ExitCare Patient Information 2014 ExitCare, [place].   ________________________________________________________________________  WHAT IS A BLOOD TRANSFUSION? Blood Transfusion Information  A transfusion is the replacement of blood or some of its parts. Blood is made up of multiple cells which provide different functions.  Red blood cells carry oxygen and are used for blood loss replacement.  White blood cells fight against infection.  Platelets control bleeding.  Plasma helps clot blood.  Other blood products are available for specialized needs, such as hemophilia or other clotting disorders. BEFORE THE TRANSFUSION  Who gives blood for transfusions?   Healthy volunteers who are fully evaluated to make sure their blood is safe. This is blood bank blood. Transfusion therapy is the safest it has ever been in the practice of medicine. Before blood is taken from a donor, a complete history is taken to make sure that person has no history of diseases  nor engages in risky social behavior (examples are intravenous drug use or sexual activity with multiple partners). The donor's travel history is screened to minimize risk of transmitting infections, such as malaria. The donated blood is tested for signs of infectious diseases, such as HIV and hepatitis. The blood is then tested to be sure it is compatible with you in order to minimize the chance of a transfusion reaction. If you or a relative donates blood, this is often done in anticipation of surgery and is not appropriate for emergency situations. It takes many days to process the donated blood. RISKS AND COMPLICATIONS Although transfusion therapy is very safe and saves many lives, the main dangers of transfusion include:   Getting an infectious disease.  Developing a transfusion reaction. This is an allergic reaction to something in the blood you were given. Every precaution is taken to prevent this. The decision to have a blood transfusion has been considered carefully by your caregiver before blood is given. Blood is not given unless the benefits outweigh the risks. AFTER THE TRANSFUSION  Right after receiving a blood transfusion, you will usually feel much better and more energetic. This is especially true if your red blood cells have gotten low (anemic). The transfusion raises the level of the red blood cells which carry oxygen, and this usually causes an energy increase.  The nurse administering the transfusion will monitor you carefully for complications. HOME CARE INSTRUCTIONS  No special instructions are needed after a transfusion. You may find your energy is better. Speak  with your caregiver about any limitations on activity for underlying diseases you may have. SEEK MEDICAL CARE IF:   Your condition is not improving after your transfusion.  You develop redness or irritation at the intravenous (IV) site. SEEK IMMEDIATE MEDICAL CARE IF:  Any of the following symptoms occur over the  next 12 hours:  Shaking chills.  You have a temperature by mouth above 102 F (38.9 C), not controlled by medicine.  Chest, back, or muscle pain.  People around you feel you are not acting correctly or are confused.  Shortness of breath or difficulty breathing.  Dizziness and fainting.  You get a rash or develop hives.  You have a decrease in urine output.  Your urine turns a dark color or changes to pink, red, or brown. Any of the following symptoms occur over the next 10 days:  You have a temperature by mouth above 102 F (38.9 C), not controlled by medicine.  Shortness of breath.  Weakness after normal activity.  The white part of the eye turns yellow (jaundice).  You have a decrease in the amount of urine or are urinating less often.  Your urine turns a dark color or changes to pink, red, or brown. Document Released: 02/05/2000 Document Revised: 05/02/2011 Document Reviewed: 09/24/2007 [facility] Patient Information 2014 [place], [place].  _______________________________________________________________________

## 2014-10-21 ENCOUNTER — Encounter (HOSPITAL_COMMUNITY): Payer: Self-pay

## 2014-10-21 ENCOUNTER — Encounter (HOSPITAL_COMMUNITY)
Admission: RE | Admit: 2014-10-21 | Discharge: 2014-10-21 | Disposition: A | Discharge status: Home / Self Care | Payer: PPO | Source: Ambulatory Visit | Attending: Orthopedic Surgery | Admitting: Orthopedic Surgery

## 2014-10-21 DIAGNOSIS — M179 Osteoarthritis of knee, unspecified: Secondary | ICD-10-CM | POA: Diagnosis not present

## 2014-10-21 DIAGNOSIS — Z01818 Encounter for other preprocedural examination: Secondary | ICD-10-CM | POA: Insufficient documentation

## 2014-10-21 HISTORY — DX: Unspecified osteoarthritis, unspecified site: M19.90

## 2014-10-21 HISTORY — DX: Anxiety disorder, unspecified: F41.9

## 2014-10-21 HISTORY — DX: Other complications of anesthesia, initial encounter: T88.59XA

## 2014-10-21 HISTORY — DX: Depression, unspecified: F32.A

## 2014-10-21 HISTORY — DX: Nausea with vomiting, unspecified: R11.2

## 2014-10-21 HISTORY — DX: Adverse effect of unspecified anesthetic, initial encounter: T41.45XA

## 2014-10-21 HISTORY — DX: Major depressive disorder, single episode, unspecified: F32.9

## 2014-10-21 HISTORY — DX: Other specified postprocedural states: Z98.890

## 2014-10-21 LAB — CBC WITH DIFFERENTIAL/PLATELET
Basophils Absolute: 0 10*3/uL (ref 0.0–0.1)
Basophils Relative: 0 % (ref 0–1)
Eosinophils Absolute: 0.2 10*3/uL (ref 0.0–0.7)
Eosinophils Relative: 3 % (ref 0–5)
HCT: 46 % (ref 39.0–52.0)
Hemoglobin: 15.8 g/dL (ref 13.0–17.0)
Lymphocytes Relative: 41 % (ref 12–46)
Lymphs Abs: 3.1 10*3/uL (ref 0.7–4.0)
MCH: 30.1 pg (ref 26.0–34.0)
MCHC: 34.3 g/dL (ref 30.0–36.0)
MCV: 87.6 fL (ref 78.0–100.0)
Monocytes Absolute: 0.7 10*3/uL (ref 0.1–1.0)
Monocytes Relative: 10 % (ref 3–12)
Neutro Abs: 3.6 10*3/uL (ref 1.7–7.7)
Neutrophils Relative %: 46 % (ref 43–77)
Platelets: 209 10*3/uL (ref 150–400)
RBC: 5.25 MIL/uL (ref 4.22–5.81)
RDW: 13.5 % (ref 11.5–15.5)
WBC: 7.6 10*3/uL (ref 4.0–10.5)

## 2014-10-21 LAB — COMPREHENSIVE METABOLIC PANEL
ALT: 47 U/L (ref 17–63)
AST: 45 U/L — ABNORMAL HIGH (ref 15–41)
Albumin: 5.2 g/dL — ABNORMAL HIGH (ref 3.5–5.0)
Alkaline Phosphatase: 81 U/L (ref 38–126)
Anion gap: 12 (ref 5–15)
BUN: 11 mg/dL (ref 6–20)
CO2: 30 mmol/L (ref 22–32)
Calcium: 9.5 mg/dL (ref 8.9–10.3)
Chloride: 98 mmol/L — ABNORMAL LOW (ref 101–111)
Creatinine, Ser: 0.8 mg/dL (ref 0.61–1.24)
GFR calc Af Amer: >60 mL/min (ref >60)
GFR calc non Af Amer: >60 mL/min (ref >60)
Glucose, Bld: 81 mg/dL (ref 65–99)
Potassium: 3.8 mmol/L (ref 3.5–5.1)
Sodium: 140 mmol/L (ref 135–145)
Total Bilirubin: 0.8 mg/dL (ref 0.3–1.2)
Total Protein: 8.3 g/dL — ABNORMAL HIGH (ref 6.5–8.1)

## 2014-10-21 LAB — ABO/RH: ABO/RH(D): O POS

## 2014-10-21 LAB — PROTIME-INR
INR: 1.01 (ref 0.00–1.49)
Prothrombin Time: 13.5 seconds (ref 11.6–15.2)

## 2014-10-21 LAB — URINALYSIS, ROUTINE W REFLEX MICROSCOPIC
Bilirubin Urine: NEGATIVE
Glucose, UA: NEGATIVE mg/dL
Hgb urine dipstick: NEGATIVE
Ketones, ur: NEGATIVE mg/dL
Leukocytes, UA: NEGATIVE
Nitrite: NEGATIVE
Protein, ur: NEGATIVE mg/dL
Specific Gravity, Urine: 1.007 (ref 1.005–1.030)
Urobilinogen, UA: 0.2 mg/dL (ref 0.0–1.0)
pH: 7.5 (ref 5.0–8.0)

## 2014-10-21 LAB — SURGICAL PCR SCREEN
MRSA, PCR: NEGATIVE
Staphylococcus aureus: NEGATIVE

## 2014-10-21 LAB — APTT: aPTT: 29 seconds (ref 24–37)

## 2014-10-21 NOTE — Progress Notes (Signed)
   10/21/14 1340  OBSTRUCTIVE SLEEP APNEA  Have you ever been diagnosed with sleep apnea through a sleep study? No  Do you snore loudly (loud enough to be heard through closed doors)?  1  Do you often feel tired, fatigued, or sleepy during the daytime? 0  Has anyone observed you stop breathing during your sleep? 0  Do you have, or are you being treated for high blood pressure? 1  BMI more than 35 kg/m2? 0  Age over 54 years old? 1  Neck circumference greater than 40 cm/16 inches? 1  Gender: 1

## 2014-10-21 NOTE — H&P (Signed)
TOTAL KNEE ADMISSION H&P  Patient is being admitted for right total knee arthroplasty.  Subjective:  Chief Complaint:right knee pain.  HPI: [name] Harper, 54 y.o. male, has a history of pain and functional disability in the right knee due to arthritis and has failed non-surgical conservative treatments for greater than 12 weeks to includeNSAID's and/or analgesics, corticosteriod injections, flexibility and strengthening excercises and activity modification.  Onset of symptoms was gradual, starting 2 years ago with gradually worsening course since that time. The patient noted prior procedures on the knee to include  arthroscopy and menisectomy on the right knee(s).  Patient currently rates pain in the right knee(s) at 7 out of 10 with activity. Patient has night pain, worsening of pain with activity and weight bearing, pain that interferes with activities of daily living, pain with passive range of motion, crepitus and joint swelling.  Patient has evidence of periarticular osteophytes and joint space narrowing by imaging studies.There is no active infection.   Past Medical History  Diagnosis Date  . Bronchitis   . Hypertension   . Hypercholesterolemia   . Complication of anesthesia   . PONV (postoperative nausea and vomiting)   . Arthritis   . Depression   . Anxiety     Past Surgical History  Procedure Laterality Date  . Abdominal surgery  2007    hiatal hernia repair  . Knee arthroscopy  2016    rt knee      Current outpatient prescriptions:  .  azelastine (ASTELIN) 0.1 % nasal spray, Place 2 sprays into both nostrils 2 (two) times daily., Disp: , Rfl:  .  citalopram (CELEXA) 20 MG tablet, Take 20 mg by mouth 2 (two) times daily., Disp: , Rfl:  .  clonazePAM (KLONOPIN) 0.5 MG tablet, Take 0.5 mg by mouth every 8 (eight) hours as needed. anxiety, Disp: , Rfl:  .  hydrochlorothiazide (HYDRODIURIL) 25 MG tablet, Take 25 mg by mouth every morning. , Disp: , Rfl:  .  Misc Natural  Products (OSTEO BI-FLEX JOINT SHIELD PO), Take 1 tablet by mouth 2 (two) times daily., Disp: , Rfl:  .  montelukast (SINGULAIR) 10 MG tablet, Take 10 mg by mouth every evening., Disp: , Rfl:  .  naproxen (NAPROSYN) 500 MG tablet, Take 500 mg by mouth 2 (two) times daily., Disp: , Rfl:  .  NASONEX 50 MCG/ACT nasal spray, Place 2 sprays into both nostrils daily., Disp: , Rfl:  .  simvastatin (ZOCOR) 20 MG tablet, Take 20 mg by mouth at bedtime., Disp: , Rfl:  .  Testosterone (ANDROGEL) 20.25 MG/1.25GM (1.62%) GEL, Place 20.25 mg onto the skin daily., Disp: , Rfl:  .  testosterone (ANDROGEL) 50 MG/5GM (1%) GEL, Place 5 g onto the skin daily., Disp: , Rfl:    No Known Allergies  Social History  Substance Use Topics  . Smoking status: Never Smoker   . Smokeless tobacco: Not on file  . Alcohol Use: No      Review of Systems  Constitutional: Positive for malaise/fatigue. Negative for fever, chills, weight loss and diaphoresis.  HENT: Negative.   Eyes: Negative.   Cardiovascular: Negative.   Gastrointestinal: Negative.   Genitourinary: Negative.   Musculoskeletal: Positive for joint pain. Negative for myalgias, back pain, falls and neck pain.       Right knee pain  Skin: Negative.   Neurological: Negative.  Negative for weakness.  Endo/Heme/Allergies: Negative.   Psychiatric/Behavioral: Negative.     Objective:  Physical Exam  Constitutional: He  is oriented to person, place, and time. He appears well-developed and well-nourished. No distress.  HENT:  Head: Normocephalic and atraumatic.  Right Ear: External ear normal.  Left Ear: External ear normal.  Nose: Nose normal.  Mouth/Throat: Oropharynx is clear and moist.  Eyes: Conjunctivae and EOM are normal.  Neck: Normal range of motion. Neck supple.  Cardiovascular: Normal rate, regular rhythm, normal heart sounds and intact distal pulses.   No murmur heard. Respiratory: Effort normal and breath sounds normal. No respiratory  distress. He has no wheezes.  GI: Soft. Bowel sounds are normal. He exhibits no distension. There is no tenderness.  Musculoskeletal:       Right hip: Normal.       Left hip: Normal.       Right knee: He exhibits decreased range of motion and swelling. He exhibits no effusion and no erythema. Tenderness found. Medial joint line and lateral joint line tenderness noted.       Left knee: Normal.  Neurological: He is alert and oriented to person, place, and time. He has normal strength and normal reflexes. No sensory deficit.  Skin: No rash noted. He is not diaphoretic. No erythema.  Psychiatric: He has a normal mood and affect. His behavior is normal.    Vital signs in last 24 hours: Temp:  [98.5 F (36.9 C)] 98.5 F (36.9 C) (08/30 1322) Pulse Rate:  [92] 92 (08/30 1322) Resp:  [16] 16 (08/30 1322) BP: (125)/(96) 125/96 mmHg (08/30 1322) SpO2:  [97 %] 97 % (08/30 1322) Weight:  [84.086 kg (185 lb 6 oz)] 84.086 kg (185 lb 6 oz) (08/30 1322)  Labs:   Estimated body mass index is 33.83 kg/(m^2) as calculated from the following:   Height as of 02/13/14: 5\' 2"  (1.575 m).   Weight as of 02/13/14: 83.915 kg (185 lb).   Imaging Review Plain radiographs demonstrate severe degenerative joint disease of the right knee(s). The overall alignment ismild varus. The bone quality appears to be good for age and reported activity level.  Assessment/Plan:  End stage primary osteoarthritis, right knee   The patient history, physical examination, clinical judgment of the provider and imaging studies are consistent with end stage degenerative joint disease of the right knee(s) and total knee arthroplasty is deemed medically necessary. The treatment options including medical management, injection therapy arthroscopy and arthroplasty were discussed at length. The risks and benefits of total knee arthroplasty were presented and reviewed. The risks due to aseptic loosening, infection, stiffness, patella  tracking problems, thromboembolic complications and other imponderables were discussed. The patient acknowledged the explanation, agreed to proceed with the plan and consent was signed. Patient is being admitted for inpatient treatment for surgery, pain control, PT, OT, prophylactic antibiotics, VTE prophylaxis, progressive ambulation and ADL's and discharge planning. The patient is planning to be discharged to skilled nursing facility [facility])   TXA IV May need sliding scale inpatient PCP: Dr. Clotilde Dieter, PA-C

## 2014-10-28 LAB — TYPE AND SCREEN
ABO/RH(D): O POS
Antibody Screen: NEGATIVE

## 2014-10-29 ENCOUNTER — Inpatient Hospital Stay (HOSPITAL_COMMUNITY): Payer: PPO | Admitting: Certified Registered"

## 2014-10-29 ENCOUNTER — Encounter (HOSPITAL_COMMUNITY)
Admission: RE | Disposition: A | Discharge status: Skilled Nursing Facility | Payer: Self-pay | Source: Ambulatory Visit | Attending: Orthopedic Surgery

## 2014-10-29 ENCOUNTER — Inpatient Hospital Stay (HOSPITAL_COMMUNITY)
Admission: RE | Admit: 2014-10-29 | Discharge: 2014-10-31 | DRG: 470 | Disposition: A | Discharge status: Skilled Nursing Facility | Payer: PPO | Source: Ambulatory Visit | Attending: Orthopedic Surgery | Admitting: Orthopedic Surgery

## 2014-10-29 ENCOUNTER — Encounter (HOSPITAL_COMMUNITY): Payer: Self-pay | Admitting: *Deleted

## 2014-10-29 DIAGNOSIS — M1711 Unilateral primary osteoarthritis, right knee: Secondary | ICD-10-CM | POA: Diagnosis present

## 2014-10-29 DIAGNOSIS — Z96659 Presence of unspecified artificial knee joint: Secondary | ICD-10-CM

## 2014-10-29 DIAGNOSIS — F419 Anxiety disorder, unspecified: Secondary | ICD-10-CM | POA: Diagnosis present

## 2014-10-29 DIAGNOSIS — I10 Essential (primary) hypertension: Secondary | ICD-10-CM | POA: Diagnosis present

## 2014-10-29 DIAGNOSIS — E780 Pure hypercholesterolemia: Secondary | ICD-10-CM | POA: Diagnosis present

## 2014-10-29 DIAGNOSIS — F329 Major depressive disorder, single episode, unspecified: Secondary | ICD-10-CM | POA: Diagnosis present

## 2014-10-29 HISTORY — PX: TOTAL KNEE ARTHROPLASTY: SHX125

## 2014-10-29 SURGERY — ARTHROPLASTY, KNEE, TOTAL
Anesthesia: Monitor Anesthesia Care | Site: Knee | Laterality: Right

## 2014-10-29 MED ORDER — HYDROMORPHONE HCL 1 MG/ML IJ SOLN: 0.2500 mg | INTRAMUSCULAR | Status: DC | PRN

## 2014-10-29 MED ORDER — LACTATED RINGERS IV SOLN
INTRAVENOUS | Status: DC
  Administered 2014-10-29 – 2014-10-30 (×4): via INTRAVENOUS

## 2014-10-29 MED ORDER — BISACODYL 5 MG PO TBEC: 5.0000 mg | DELAYED_RELEASE_TABLET | Freq: Every day | ORAL | Status: DC | PRN

## 2014-10-29 MED ORDER — GELATIN ABSORBABLE MT POWD
OROMUCOSAL | Status: DC | PRN
  Administered 2014-10-29: 10:00:00 via TOPICAL

## 2014-10-29 MED ORDER — PROPOFOL 10 MG/ML IV BOLUS
INTRAVENOUS | Status: AC
  Filled 2014-10-29: qty 20

## 2014-10-29 MED ORDER — AZELASTINE HCL 0.1 % NA SOLN
2.0000 | Freq: Two times a day (BID) | NASAL | Status: DC
  Administered 2014-10-29 – 2014-10-31 (×4): 2 via NASAL
  Filled 2014-10-29: qty 30

## 2014-10-29 MED ORDER — LIDOCAINE HCL (CARDIAC) 20 MG/ML IV SOLN
INTRAVENOUS | Status: AC
  Filled 2014-10-29: qty 5

## 2014-10-29 MED ORDER — ONDANSETRON HCL 4 MG/2ML IJ SOLN: 4.0000 mg | Freq: Four times a day (QID) | INTRAMUSCULAR | Status: DC | PRN

## 2014-10-29 MED ORDER — ACETAMINOPHEN 325 MG PO TABS: 650.0000 mg | ORAL_TABLET | Freq: Four times a day (QID) | ORAL | Status: DC | PRN

## 2014-10-29 MED ORDER — TRANEXAMIC ACID 1000 MG/10ML IV SOLN
1000.0000 mg | INTRAVENOUS | Status: AC
  Administered 2014-10-29: 1000 mg via INTRAVENOUS
  Filled 2014-10-29: qty 10

## 2014-10-29 MED ORDER — TESTOSTERONE 50 MG/5GM (1%) TD GEL: 5.0000 g | Freq: Every day | TRANSDERMAL | Status: DC

## 2014-10-29 MED ORDER — PHENOL 1.4 % MT LIQD
1.0000 | OROMUCOSAL | Status: DC | PRN
Start: 2014-10-29 — End: 2014-10-31
  Filled 2014-10-29: qty 177

## 2014-10-29 MED ORDER — CLONAZEPAM 0.5 MG PO TABS
0.5000 mg | ORAL_TABLET | Freq: Every day | ORAL | Status: DC
  Administered 2014-10-30 – 2014-10-31 (×2): 0.5 mg via ORAL
  Filled 2014-10-29 (×2): qty 1

## 2014-10-29 MED ORDER — FENTANYL CITRATE (PF) 100 MCG/2ML IJ SOLN
INTRAMUSCULAR | Status: DC | PRN
  Administered 2014-10-29 (×2): 50 ug via INTRAVENOUS

## 2014-10-29 MED ORDER — OXYCODONE-ACETAMINOPHEN 5-325 MG PO TABS: 2.0000 | ORAL_TABLET | ORAL | Status: DC | PRN

## 2014-10-29 MED ORDER — DEXAMETHASONE SODIUM PHOSPHATE 10 MG/ML IJ SOLN
INTRAMUSCULAR | Status: DC | PRN
  Administered 2014-10-29: 10 mg via INTRAVENOUS

## 2014-10-29 MED ORDER — FENTANYL CITRATE (PF) 100 MCG/2ML IJ SOLN
INTRAMUSCULAR | Status: AC
  Filled 2014-10-29: qty 4

## 2014-10-29 MED ORDER — FLUTICASONE PROPIONATE 50 MCG/ACT NA SUSP
1.0000 | Freq: Every day | NASAL | Status: DC
  Administered 2014-10-30 – 2014-10-31 (×2): 1 via NASAL
  Filled 2014-10-29: qty 16

## 2014-10-29 MED ORDER — RIVAROXABAN 10 MG PO TABS
10.0000 mg | ORAL_TABLET | Freq: Every day | ORAL | Status: DC
  Administered 2014-10-30 – 2014-10-31 (×2): 10 mg via ORAL
  Filled 2014-10-29 (×3): qty 1

## 2014-10-29 MED ORDER — PROPOFOL 10 MG/ML IV BOLUS
INTRAVENOUS | Status: AC
Start: 2014-10-29 — End: 2014-10-29
  Filled 2014-10-29: qty 20

## 2014-10-29 MED ORDER — BUPIVACAINE HCL (PF) 0.25 % IJ SOLN
INTRAMUSCULAR | Status: DC | PRN
  Administered 2014-10-29: 20 mL

## 2014-10-29 MED ORDER — HYDROCODONE-ACETAMINOPHEN 5-325 MG PO TABS
1.0000 | ORAL_TABLET | ORAL | Status: DC | PRN
  Administered 2014-10-29: 1 via ORAL
  Administered 2014-10-30 – 2014-10-31 (×9): 2 via ORAL
  Filled 2014-10-29 (×2): qty 2
  Filled 2014-10-29: qty 1
  Filled 2014-10-29 (×7): qty 2

## 2014-10-29 MED ORDER — SODIUM CHLORIDE 0.9 % IJ SOLN
INTRAMUSCULAR | Status: AC
  Filled 2014-10-29: qty 50

## 2014-10-29 MED ORDER — POLYETHYLENE GLYCOL 3350 17 G PO PACK: 17.0000 g | PACK | Freq: Every day | ORAL | Status: DC | PRN

## 2014-10-29 MED ORDER — CEFAZOLIN SODIUM-DEXTROSE 2-3 GM-% IV SOLR
2.0000 g | INTRAVENOUS | Status: AC
  Administered 2014-10-29: 2 g via INTRAVENOUS

## 2014-10-29 MED ORDER — FERROUS SULFATE 325 (65 FE) MG PO TABS
325.0000 mg | ORAL_TABLET | Freq: Three times a day (TID) | ORAL | Status: DC
  Administered 2014-10-30 – 2014-10-31 (×4): 325 mg via ORAL
  Filled 2014-10-29 (×8): qty 1

## 2014-10-29 MED ORDER — DEXAMETHASONE SODIUM PHOSPHATE 10 MG/ML IJ SOLN
INTRAMUSCULAR | Status: AC
  Filled 2014-10-29: qty 1

## 2014-10-29 MED ORDER — FLEET ENEMA 7-19 GM/118ML RE ENEM: 1.0000 | ENEMA | Freq: Once | RECTAL | Status: DC | PRN

## 2014-10-29 MED ORDER — BUPIVACAINE HCL (PF) 0.25 % IJ SOLN
INTRAMUSCULAR | Status: AC
  Filled 2014-10-29: qty 30

## 2014-10-29 MED ORDER — LACTATED RINGERS IV SOLN
INTRAVENOUS | Status: DC
Start: 2014-10-29 — End: 2014-10-31

## 2014-10-29 MED ORDER — CEFAZOLIN SODIUM 1-5 GM-% IV SOLN
1.0000 g | Freq: Four times a day (QID) | INTRAVENOUS | Status: AC
  Administered 2014-10-29 (×2): 1 g via INTRAVENOUS
  Filled 2014-10-29 (×2): qty 50

## 2014-10-29 MED ORDER — LIDOCAINE HCL (CARDIAC) 20 MG/ML IV SOLN
INTRAVENOUS | Status: DC | PRN
  Administered 2014-10-29: 30 mg via INTRAVENOUS

## 2014-10-29 MED ORDER — CITALOPRAM HYDROBROMIDE 20 MG PO TABS
20.0000 mg | ORAL_TABLET | Freq: Two times a day (BID) | ORAL | Status: DC
  Administered 2014-10-29 – 2014-10-31 (×4): 20 mg via ORAL
  Filled 2014-10-29 (×5): qty 1

## 2014-10-29 MED ORDER — PROMETHAZINE HCL 25 MG/ML IJ SOLN: 6.2500 mg | INTRAMUSCULAR | Status: DC | PRN

## 2014-10-29 MED ORDER — ONDANSETRON HCL 4 MG/2ML IJ SOLN
INTRAMUSCULAR | Status: AC
  Filled 2014-10-29: qty 2

## 2014-10-29 MED ORDER — SODIUM CHLORIDE 0.9 % IR SOLN
Status: DC | PRN
  Administered 2014-10-29: 500 mL

## 2014-10-29 MED ORDER — PROPOFOL INFUSION 10 MG/ML OPTIME
INTRAVENOUS | Status: DC | PRN
  Administered 2014-10-29: 100 ug/kg/min via INTRAVENOUS

## 2014-10-29 MED ORDER — MIDAZOLAM HCL 2 MG/2ML IJ SOLN
INTRAMUSCULAR | Status: AC
  Filled 2014-10-29: qty 4

## 2014-10-29 MED ORDER — METHOCARBAMOL 500 MG PO TABS
500.0000 mg | ORAL_TABLET | Freq: Four times a day (QID) | ORAL | Status: DC | PRN
  Administered 2014-10-30 – 2014-10-31 (×3): 500 mg via ORAL
  Filled 2014-10-29 (×3): qty 1

## 2014-10-29 MED ORDER — HYDROMORPHONE HCL 1 MG/ML IJ SOLN
1.0000 mg | INTRAMUSCULAR | Status: DC | PRN
  Administered 2014-10-29 (×3): 1 mg via INTRAVENOUS
  Filled 2014-10-29 (×3): qty 1

## 2014-10-29 MED ORDER — CHLORHEXIDINE GLUCONATE 4 % EX LIQD: 60.0000 mL | Freq: Once | CUTANEOUS | Status: DC

## 2014-10-29 MED ORDER — METHOCARBAMOL 1000 MG/10ML IJ SOLN
500.0000 mg | Freq: Four times a day (QID) | INTRAMUSCULAR | Status: DC | PRN
Start: 2014-10-29 — End: 2014-10-31
  Administered 2014-10-29: 500 mg via INTRAVENOUS
  Filled 2014-10-29 (×2): qty 5

## 2014-10-29 MED ORDER — SODIUM CHLORIDE 0.9 % IR SOLN
Status: AC
  Filled 2014-10-29: qty 1

## 2014-10-29 MED ORDER — OXYCODONE HCL 5 MG PO TABS: 5.0000 mg | ORAL_TABLET | Freq: Once | ORAL | Status: DC | PRN

## 2014-10-29 MED ORDER — KETOROLAC TROMETHAMINE 30 MG/ML IJ SOLN
30.0000 mg | Freq: Once | INTRAMUSCULAR | Status: AC
  Administered 2014-10-29: 30 mg via INTRAVENOUS

## 2014-10-29 MED ORDER — ACETAMINOPHEN 10 MG/ML IV SOLN
INTRAVENOUS | Status: AC
  Filled 2014-10-29: qty 100

## 2014-10-29 MED ORDER — BUPIVACAINE LIPOSOME 1.3 % IJ SUSP
20.0000 mL | Freq: Once | INTRAMUSCULAR | Status: AC
  Administered 2014-10-29: 20 mL
  Filled 2014-10-29: qty 20

## 2014-10-29 MED ORDER — ACETAMINOPHEN 650 MG RE SUPP: 650.0000 mg | Freq: Four times a day (QID) | RECTAL | Status: DC | PRN

## 2014-10-29 MED ORDER — ACETAMINOPHEN 10 MG/ML IV SOLN
INTRAVENOUS | Status: DC | PRN
  Administered 2014-10-29: 1000 mg via INTRAVENOUS

## 2014-10-29 MED ORDER — OXYCODONE HCL 5 MG/5ML PO SOLN
5.0000 mg | Freq: Once | ORAL | Status: DC | PRN
  Filled 2014-10-29: qty 5

## 2014-10-29 MED ORDER — ONDANSETRON HCL 4 MG/2ML IJ SOLN
INTRAMUSCULAR | Status: DC | PRN
  Administered 2014-10-29: 4 mg via INTRAVENOUS

## 2014-10-29 MED ORDER — MENTHOL 3 MG MT LOZG
1.0000 | LOZENGE | OROMUCOSAL | Status: DC | PRN
  Filled 2014-10-29: qty 9

## 2014-10-29 MED ORDER — SODIUM CHLORIDE 0.9 % IJ SOLN
INTRAMUSCULAR | Status: DC | PRN
  Administered 2014-10-29: 20 mL

## 2014-10-29 MED ORDER — CELECOXIB 200 MG PO CAPS
200.0000 mg | ORAL_CAPSULE | Freq: Two times a day (BID) | ORAL | Status: DC
  Administered 2014-10-29 – 2014-10-31 (×4): 200 mg via ORAL
  Filled 2014-10-29 (×6): qty 1

## 2014-10-29 MED ORDER — HYDROCHLOROTHIAZIDE 25 MG PO TABS
25.0000 mg | ORAL_TABLET | Freq: Every morning | ORAL | Status: DC
  Administered 2014-10-29 – 2014-10-31 (×3): 25 mg via ORAL
  Filled 2014-10-29 (×3): qty 1

## 2014-10-29 MED ORDER — ONDANSETRON HCL 4 MG PO TABS: 4.0000 mg | ORAL_TABLET | Freq: Four times a day (QID) | ORAL | Status: DC | PRN

## 2014-10-29 MED ORDER — KETOROLAC TROMETHAMINE 30 MG/ML IJ SOLN
INTRAMUSCULAR | Status: AC
  Administered 2014-10-29: 30 mg via INTRAVENOUS
  Filled 2014-10-29: qty 1

## 2014-10-29 MED ORDER — MIDAZOLAM HCL 5 MG/5ML IJ SOLN
INTRAMUSCULAR | Status: DC | PRN
  Administered 2014-10-29: 2 mg via INTRAVENOUS

## 2014-10-29 MED ORDER — BUPIVACAINE IN DEXTROSE 0.75-8.25 % IT SOLN
INTRATHECAL | Status: DC | PRN
  Administered 2014-10-29: 1.8 mL via INTRATHECAL

## 2014-10-29 MED ORDER — MONTELUKAST SODIUM 10 MG PO TABS
10.0000 mg | ORAL_TABLET | Freq: Every evening | ORAL | Status: DC
  Administered 2014-10-30: 10 mg via ORAL
  Filled 2014-10-29 (×2): qty 1

## 2014-10-29 MED ORDER — SIMVASTATIN 20 MG PO TABS
20.0000 mg | ORAL_TABLET | Freq: Every day | ORAL | Status: DC
  Administered 2014-10-29 – 2014-10-30 (×2): 20 mg via ORAL
  Filled 2014-10-29 (×3): qty 1

## 2014-10-29 MED ORDER — ALUM & MAG HYDROXIDE-SIMETH 200-200-20 MG/5ML PO SUSP: 30.0000 mL | ORAL | Status: DC | PRN

## 2014-10-29 MED ORDER — THROMBIN 5000 UNITS EX SOLR
CUTANEOUS | Status: AC
  Filled 2014-10-29: qty 5000

## 2014-10-29 MED ORDER — CEFAZOLIN SODIUM-DEXTROSE 2-3 GM-% IV SOLR
INTRAVENOUS | Status: AC
  Filled 2014-10-29: qty 50

## 2014-10-29 MED ORDER — PROPOFOL 10 MG/ML IV BOLUS
INTRAVENOUS | Status: DC | PRN
  Administered 2014-10-29 (×2): 20 mg via INTRAVENOUS

## 2014-10-29 SURGICAL SUPPLY — 76 items
BAG DECANTER FOR FLEXI CONT (MISCELLANEOUS) ×3 IMPLANT
BAG SPEC THK2 15X12 ZIP CLS (MISCELLANEOUS) ×1
BAG ZIPLOCK 12X15 (MISCELLANEOUS) ×3 IMPLANT
BANDAGE ELASTIC 4 VELCRO ST LF (GAUZE/BANDAGES/DRESSINGS) ×3 IMPLANT
BANDAGE ELASTIC 6 VELCRO ST LF (GAUZE/BANDAGES/DRESSINGS) ×3 IMPLANT
BANDAGE ESMARK 6X9 LF (GAUZE/BANDAGES/DRESSINGS) ×1 IMPLANT
BLADE SAG 18X100X1.27 (BLADE) ×3 IMPLANT
BLADE SAW SGTL 11.0X1.19X90.0M (BLADE) ×3 IMPLANT
BNDG ESMARK 6X9 LF (GAUZE/BANDAGES/DRESSINGS) ×3
BONE CEMENT GENTAMICIN (Cement) ×6 IMPLANT
CAP KNEE TOTAL 3 SIGMA ×3 IMPLANT
CEMENT BONE GENTAMICIN 40 (Cement) ×2 IMPLANT
COVER SURGICAL LIGHT HANDLE (MISCELLANEOUS) ×3 IMPLANT
CUFF TOURN SGL QUICK 34 (TOURNIQUET CUFF) ×2
CUFF TRNQT CYL 34X4X40X1 (TOURNIQUET CUFF) ×1 IMPLANT
DECANTER SPIKE VIAL GLASS SM (MISCELLANEOUS) ×3 IMPLANT
DRAPE EXTREMITY T 121X128X90 (DRAPE) ×3 IMPLANT
DRAPE INCISE IOBAN 66X45 STRL (DRAPES) IMPLANT
DRAPE POUCH INSTRU U-SHP 10X18 (DRAPES) ×3 IMPLANT
DRAPE SHEET LG 3/4 BI-LAMINATE (DRAPES) ×3 IMPLANT
DRAPE U-SHAPE 47X51 STRL (DRAPES) ×3 IMPLANT
DRSG AQUACEL AG ADV 3.5X10 (GAUZE/BANDAGES/DRESSINGS) ×3 IMPLANT
DRSG PAD ABDOMINAL 8X10 ST (GAUZE/BANDAGES/DRESSINGS) IMPLANT
DRSG TEGADERM 4X4.75 (GAUZE/BANDAGES/DRESSINGS) ×3 IMPLANT
DURAPREP 26ML APPLICATOR (WOUND CARE) ×3 IMPLANT
ELECT REM PT RETURN 9FT ADLT (ELECTROSURGICAL) ×3
ELECTRODE REM PT RTRN 9FT ADLT (ELECTROSURGICAL) ×1 IMPLANT
EVACUATOR 1/8 PVC DRAIN (DRAIN) ×3 IMPLANT
FACESHIELD WRAPAROUND (MASK) ×15 IMPLANT
GAUZE SPONGE 2X2 8PLY STRL LF (GAUZE/BANDAGES/DRESSINGS) ×1 IMPLANT
GLOVE BIOGEL PI IND STRL 6.5 (GLOVE) ×1 IMPLANT
GLOVE BIOGEL PI IND STRL 8 (GLOVE) ×1 IMPLANT
GLOVE BIOGEL PI INDICATOR 6.5 (GLOVE) ×2
GLOVE BIOGEL PI INDICATOR 8 (GLOVE) ×2
GLOVE ECLIPSE 8.0 STRL XLNG CF (GLOVE) ×6 IMPLANT
GLOVE SURG SS PI 6.5 STRL IVOR (GLOVE) ×3 IMPLANT
GOWN STRL REUS W/TWL LRG LVL3 (GOWN DISPOSABLE) ×3 IMPLANT
GOWN STRL REUS W/TWL XL LVL3 (GOWN DISPOSABLE) ×3 IMPLANT
HANDPIECE INTERPULSE COAX TIP (DISPOSABLE) ×2
IMMOBILIZER KNEE 20 (SOFTGOODS) ×6 IMPLANT
IMMOBILIZER KNEE 20 THIGH 36 (SOFTGOODS) ×1 IMPLANT
KIT BASIN OR (CUSTOM PROCEDURE TRAY) ×3 IMPLANT
LIQUID BAND (GAUZE/BANDAGES/DRESSINGS) ×3 IMPLANT
MANIFOLD NEPTUNE II (INSTRUMENTS) ×3 IMPLANT
NDL SAFETY ECLIPSE 18X1.5 (NEEDLE) ×2 IMPLANT
NEEDLE HYPO 18GX1.5 SHARP (NEEDLE) ×4
NEEDLE HYPO 22GX1.5 SAFETY (NEEDLE) ×3 IMPLANT
NS IRRIG 1000ML POUR BTL (IV SOLUTION) IMPLANT
PACK TOTAL JOINT (CUSTOM PROCEDURE TRAY) ×3 IMPLANT
PADDING CAST COTTON 6X4 STRL (CAST SUPPLIES) ×3 IMPLANT
PEN SKIN MARKING BROAD (MISCELLANEOUS) ×3 IMPLANT
POSITIONER SURGICAL ARM (MISCELLANEOUS) ×3 IMPLANT
SET HNDPC FAN SPRY TIP SCT (DISPOSABLE) ×1 IMPLANT
SET PAD KNEE POSITIONER (MISCELLANEOUS) ×3 IMPLANT
SPONGE GAUZE 2X2 STER 10/PKG (GAUZE/BANDAGES/DRESSINGS) ×2
SPONGE LAP 18X18 X RAY DECT (DISPOSABLE) ×3 IMPLANT
SPONGE SURGIFOAM ABS GEL 100 (HEMOSTASIS) ×3 IMPLANT
STAPLER VISISTAT 35W (STAPLE) ×3 IMPLANT
SUCTION FRAZIER 12FR DISP (SUCTIONS) ×3 IMPLANT
SUT BONE WAX W31G (SUTURE) ×3 IMPLANT
SUT MNCRL AB 4-0 PS2 18 (SUTURE) ×3 IMPLANT
SUT VIC AB 1 CT1 27 (SUTURE) ×4
SUT VIC AB 1 CT1 27XBRD ANTBC (SUTURE) ×2 IMPLANT
SUT VIC AB 2-0 CT1 27 (SUTURE) ×6
SUT VIC AB 2-0 CT1 TAPERPNT 27 (SUTURE) ×3 IMPLANT
SUT VLOC 180 0 24IN GS25 (SUTURE) ×3 IMPLANT
SYR 20CC LL (SYRINGE) ×6 IMPLANT
SYR 50ML LL SCALE MARK (SYRINGE) ×3 IMPLANT
TOWEL OR 17X26 10 PK STRL BLUE (TOWEL DISPOSABLE) ×3 IMPLANT
TOWEL OR NON WOVEN STRL DISP B (DISPOSABLE) ×3 IMPLANT
TOWER CARTRIDGE SMART MIX (DISPOSABLE) ×3 IMPLANT
TRAY FOLEY W/METER SILVER 14FR (SET/KITS/TRAYS/PACK) IMPLANT
TRAY FOLEY W/METER SILVER 16FR (SET/KITS/TRAYS/PACK) ×3 IMPLANT
WATER STERILE IRR 1500ML POUR (IV SOLUTION) ×3 IMPLANT
WRAP KNEE MAXI GEL POST OP (GAUZE/BANDAGES/DRESSINGS) ×3 IMPLANT
YANKAUER SUCT BULB TIP 10FT TU (MISCELLANEOUS) ×3 IMPLANT

## 2014-10-29 NOTE — Transfer of Care (Signed)
Immediate Anesthesia Transfer of Care Note  Patient: [name] Harper  Procedure(s) Performed: Procedure(s): RIGHT TOTAL KNEE ARTHROPLASTY (Right)  Patient Location: PACU  Anesthesia Type:Spinal  Level of Consciousness:  sedated, patient cooperative and responds to stimulation  Airway & Oxygen Therapy:Patient Spontanous Breathing and Patient connected to face mask oxgen  Post-op Assessment:  Report given to PACU RN and Post -op Vital signs reviewed and stable  Post vital signs:  Reviewed and stable  Last Vitals:  Filed Vitals:   10/29/14 0617  BP: 128/93  Pulse: 90  Temp: 36.7 C  Resp: 18    Complications: No apparent anesthesia complications

## 2014-10-29 NOTE — Clinical Social Work Note (Signed)
Clinical Social Work Assessment  Patient Details  Name: [name] Harper MRN: [id number] Date of Birth: Jul 11, 1960  Date of referral:  10/29/14               Reason for consult:  Facility Placement, Discharge Planning                Permission sought to share information with:  Chartered certified accountant granted to share information::  Yes, Verbal Permission Granted  Name::        Agency::     Relationship::     Contact Information:     Housing/Transportation Living arrangements for the past 2 months:  Single Family Home Source of Information:  Patient, Spouse Patient Interpreter Needed:  None Criminal Activity/Legal Involvement Pertinent to Current Situation/Hospitalization:  No - Comment as needed Significant Relationships:  Spouse Lives with:  Spouse Do you feel safe going back to the place where you live?   (ST Rehab most likely needed.) Need for family participation in patient care:  No (Coment)  Care giving concerns:  Pt's care cannot be managed at home following hospital d/c.   Social Worker assessment / plan:  Pt hospitalized on 10/29/14 for pre planned right total knee arthroplasty. CSW met with pt / spouse to assist with d/c planning. MD has recommended ST Rehab at d/c. PT eval is pending. Pt / spouse feel ST Rehab is needed. CSW has initiated SNF search and bed offers have been provided. Pt is requesting [facility]. SNF is able to offer ST Rehab pending [employer] medicare authorization. CSW will assist with authorization process once PT recommendations are available.  Employment status:  Disabled (Comment on whether or not currently receiving Disability) Insurance information:  Managed Medicare PT Recommendations:  Not assessed at this time Information / Referral to community resources:  [place]  Patient/Family's Response to care:  Pt / spouse feel ST Rehab is needed.  Patient/Family's Understanding of and Emotional Response to  Diagnosis, Current Treatment, and Prognosis:  Pt / spouse are aware of pt's medical status. Pt recently arrived from surgery and is sleepy from medication. Spouse is very supportive and is looking forward to pt having rehab at [facility] provided this is approved by insurance.  Emotional Assessment Appearance:  Appears stated age Attitude/Demeanor/Rapport:  Other (cooperative) Affect (typically observed):  Calm, Appropriate Orientation:  Oriented to Self, Oriented to Place, Oriented to  Time, Oriented to Situation Alcohol / Substance use:  Not Applicable Psych involvement (Current and /or in the community):  No (Comment)  Discharge Needs  Concerns to be addressed:  Discharge Planning Concerns Readmission within the last 30 days:  No Current discharge risk:  None Barriers to Discharge:  No Barriers Identified   Luretha Rued, [place] 10/29/2014, 2:18 PM

## 2014-10-29 NOTE — Anesthesia Preprocedure Evaluation (Addendum)
Anesthesia Evaluation  Patient identified by MRN, date of birth, ID band Patient awake    Reviewed: Allergy & Precautions, NPO status , Patient's Chart, lab work & pertinent test results  History of Anesthesia Complications (+) PONV  Airway Mallampati: III  TM Distance: >3 FB Neck ROM: Full    Dental   Pulmonary neg pulmonary ROS,    breath sounds clear to auscultation       Cardiovascular hypertension, Pt. on medications  Rhythm:Regular Rate:Normal     Neuro/Psych Anxiety Depression negative neurological ROS     GI/Hepatic negative GI ROS, Neg liver ROS,   Endo/Other  negative endocrine ROS  Renal/GU negative Renal ROS     Musculoskeletal  (+) Arthritis ,   Abdominal   Peds  Hematology negative hematology ROS (+)   Anesthesia Other Findings   Reproductive/Obstetrics                            Anesthesia Physical Anesthesia Plan  ASA: II  Anesthesia Plan: Spinal and MAC   Post-op Pain Management:    Induction: Intravenous  Airway Management Planned: Natural Airway and Simple Face Mask  Additional Equipment:   Intra-op Plan:   Post-operative Plan: Extubation in OR  Informed Consent: I have reviewed the patients History and Physical, chart, labs and discussed the procedure including the risks, benefits and alternatives for the proposed anesthesia with the patient or authorized representative who has indicated his/her understanding and acceptance.   Dental advisory given  Plan Discussed with: CRNA and Surgeon  Anesthesia Plan Comments:        Anesthesia Quick Evaluation

## 2014-10-29 NOTE — Progress Notes (Signed)
PT Cancellation Note  Patient Details Name: [name] Harper MRN: [id number] DOB: 03/15/1960   Cancelled Treatment:    Reason Eval/Treat Not Completed: Other (comment) (patient plans SNF, will await Eval tomorrow.)   Claretha Cooper 10/29/2014, 4:32 PM Tresa Endo PT [phone]

## 2014-10-29 NOTE — Clinical Social Work Placement (Signed)
   CLINICAL SOCIAL WORK PLACEMENT  NOTE  Date:  10/29/2014  Patient Details  Name: YARDLEY LEKAS MRN: [id number] Date of Birth: Sep 30, 1960  Clinical Social Work is seeking post-discharge placement for this patient at the [place] level of care (*CSW will initial, date and re-position this form in  chart as items are completed):  No   Patient/family provided with [facility] Work Department's list of facilities offering this level of care within the geographic area requested by the patient (or if unable, by the patient's family).  Yes   Patient/family informed of their freedom to choose among providers that offer the needed level of care, that participate in Medicare, Medicaid or managed care program needed by the patient, have an available bed and are willing to accept the patient.  Yes   Patient/family informed of [facility]'s ownership interest in [facility] and [facility], as well as of the fact that they are under no obligation to receive care at these facilities.  PASRR submitted to EDS on 10/29/14     PASRR number received on       Existing PASRR number confirmed on       FL2 transmitted to all facilities in geographic area requested by pt/family on 10/29/14     FL2 transmitted to all facilities within larger geographic area on       Patient informed that his/her managed care company has contracts with or will negotiate with certain facilities, including the following:        Yes   Patient/family informed of bed offers received.  Patient chooses bed at [facility]     Physician recommends and patient chooses bed at      Patient to be transferred to [facility] on  .  Patient to be transferred to facility by       Patient family notified on   of transfer.  Name of family member notified:        PHYSICIAN       Additional Comment:    _______________________________________________ Luretha Rued, [place]  10/29/2014, 2:29 PM

## 2014-10-29 NOTE — Anesthesia Procedure Notes (Signed)
Spinal Patient location during procedure: OR Start time: 10/29/2014 8:27 AM End time: 10/29/2014 8:32 AM Staffing Anesthesiologist: Suzette Battiest Resident/CRNA: Lajuana Carry E Performed by: resident/CRNA  Preanesthetic Checklist Completed: patient identified, site marked, surgical consent, pre-op evaluation, timeout performed, IV checked, risks and benefits discussed and monitors and equipment checked Spinal Block Patient position: sitting Prep: Betadine Patient monitoring: heart rate, continuous pulse ox and blood pressure Location: L3-4 Injection technique: single-shot Needle Needle type: Sprotte  Needle gauge: 24 G Needle length: 10 cm Assessment Sensory level: T6 Additional Notes Expiration date of kit checked and confirmed. Clear csf, neg heme, neg paresthesia. Patient tolerated procedure well, without complications.

## 2014-10-29 NOTE — Interval H&P Note (Signed)
History and Physical Interval Note:  10/29/2014 8:01 AM  [name] Harper  has presented today for surgery, with the diagnosis of OA RIGHT KNEE  The various methods of treatment have been discussed with the patient and family. After consideration of risks, benefits and other options for treatment, the patient has consented to  Procedure(s): RIGHT TOTAL KNEE ARTHROPLASTY (Right) as a surgical intervention .  The patient's history has been reviewed, patient examined, no change in status, stable for surgery.  I have reviewed the patient's chart and labs.  Questions were answered to the patient's satisfaction.     [name] A

## 2014-10-29 NOTE — Brief Op Note (Signed)
10/29/2014  10:14 AM  PATIENT:  [name]  54 y.o. male  PRE-OPERATIVE DIAGNOSIS:Primary  OA RIGHT KNEE  POST-OPERATIVE DIAGNOSIS:Primary  OA RIGHT KNEE  PROCEDURE:  Procedure(s): RIGHT TOTAL KNEE ARTHROPLASTY (Right)  SURGEON:  Surgeon(s) and Role:    * Latanya Maudlin, MD - Primary  PHYSICIAN ASSISTANT:Amber Hobart PA   ASSISTANTS: Ardeen Jourdain PA  ANESTHESIA:   spinal  EBL:  Total I/O In: 1000 [I.V.:1000] Out: -   BLOOD ADMINISTERED:none  DRAINS: (One) Hemovact drain(s) in the Right Knee with  Suction Open   LOCAL MEDICATIONS USED:  MARCAINE 20cc 42f 0.25% Plain and 2occ of Exparel mixed with 20cc of Normal Saline   SPECIMEN:  No Specimen  DISPOSITION OF SPECIMEN:  N/A  COUNTS:  YES  TOURNIQUET:  * Missing tourniquet times found for documented tourniquets in log:  [id number] *  DICTATION: .Other Dictation: Dictation Number [id number]  PLAN OF CARE: Admit to inpatient   PATIENT DISPOSITION:  Stable in OR   Delay start of Pharmacological VTE agent (>24hrs) due to surgical blood loss or risk of bleeding: yes

## 2014-10-29 NOTE — Anesthesia Postprocedure Evaluation (Signed)
  Anesthesia Post-op Note  Patient: [name] Harper  Procedure(s) Performed: Procedure(s): RIGHT TOTAL KNEE ARTHROPLASTY (Right)  Patient Location: PACU  Anesthesia Type:MAC and Spinal  Level of Consciousness: awake and alert   Airway and Oxygen Therapy: Patient Spontanous Breathing  Post-op Pain: mild  Post-op Assessment: Post-op Vital signs reviewed LLE Motor Response: Purposeful movement (lifts leg off bed/bends knee) LLE Sensation: Full sensation RLE Motor Response: Purposeful movement RLE Sensation: Full sensation L Sensory Level: S3-Medial thigh R Sensory Level: S3-Medial thigh  Post-op Vital Signs: Reviewed  Last Vitals:  Filed Vitals:   10/29/14 1200  BP: 116/83  Pulse: 83  Temp:   Resp: 15    Complications: No apparent anesthesia complications

## 2014-10-30 ENCOUNTER — Encounter (HOSPITAL_COMMUNITY): Payer: Self-pay | Admitting: Orthopedic Surgery

## 2014-10-30 LAB — BASIC METABOLIC PANEL
ANION GAP: 8 (ref 5–15)
BUN: 18 mg/dL (ref 6–20)
CALCIUM: 9 mg/dL (ref 8.9–10.3)
CHLORIDE: 99 mmol/L — AB (ref 101–111)
CO2: 28 mmol/L (ref 22–32)
Creatinine, Ser: 0.82 mg/dL (ref 0.61–1.24)
GFR calc non Af Amer: >60 mL/min (ref >60)
Glucose, Bld: 141 mg/dL — ABNORMAL HIGH (ref 65–99)
Potassium: 4.2 mmol/L (ref 3.5–5.1)
Sodium: 135 mmol/L (ref 135–145)

## 2014-10-30 LAB — CBC
HEMATOCRIT: 36.9 % — AB (ref 39.0–52.0)
HEMOGLOBIN: 12.4 g/dL — AB (ref 13.0–17.0)
MCH: 29.6 pg (ref 26.0–34.0)
MCHC: 33.6 g/dL (ref 30.0–36.0)
MCV: 88.1 fL (ref 78.0–100.0)
Platelets: 185 10*3/uL (ref 150–400)
RBC: 4.19 MIL/uL — AB (ref 4.22–5.81)
RDW: 13.7 % (ref 11.5–15.5)
WBC: 12.4 10*3/uL — AB (ref 4.0–10.5)

## 2014-10-30 NOTE — Progress Notes (Signed)
Subjective: 1 Day Post-Op Procedure(s) (LRB): RIGHT TOTAL KNEE ARTHROPLASTY (Right) Patient reports pain as 1 on 0-10 scale.Doing very well. Hemovac DCd. Should be ready for DC tomorrow.    Objective: Vital signs in last 24 hours: Temp:  [98 F (36.7 C)-99.2 F (37.3 C)] 98.5 F (36.9 C) (09/08 0553) Pulse Rate:  [73-105] 73 (09/08 0553) Resp:  [6-16] 14 (09/08 0553) BP: (101-125)/(66-89) 123/74 mmHg (09/08 0553) SpO2:  [93 %-100 %] 99 % (09/08 0553)  Intake/Output from previous day: 09/07 0701 - 09/08 0700 In: 4110.3 [P.O.:1080; I.V.:2920.3; IV Piggyback:110] Out: 2165 [Urine:1880; Drains:285] Intake/Output this shift: Total I/O In: -  Out: 325 [Urine:325]   Recent Labs  10/30/14 0503  HGB 12.4*    Recent Labs  10/30/14 0503  WBC 12.4*  RBC 4.19*  HCT 36.9*  PLT 185    Recent Labs  10/30/14 0503  NA 135  K 4.2  CL 99*  CO2 28  BUN 18  CREATININE 0.82  GLUCOSE 141*  CALCIUM 9.0   No results for input(s): LABPT, INR in the last 72 hours.  Dorsiflexion/Plantar flexion intact Compartment soft  Assessment/Plan: 1 Day Post-Op Procedure(s) (LRB): RIGHT TOTAL KNEE ARTHROPLASTY (Right) Up with therapy  Camyah Pultz A 10/30/2014, 7:11 AM

## 2014-10-30 NOTE — Care Management Note (Signed)
Case Management Note  Patient Details  Name: DELAWRENCE FRIDMAN MRN: [id number] Date of Birth: 07-Feb-1961  Subjective/Objective:          Right total knee arthroplasty          Action/Plan: Discharge planning per CSW  Expected Discharge Date:                  Expected Discharge Plan:  Skilled Nursing Facility  In-House Referral:  Clinical Social Work  Discharge planning Services  CM Consult  Post Acute Care Choice:  NA Choice offered to:  NA  DME Arranged:  N/A DME Agency:  NA  HH Arranged:  NA HH Agency:  NA  Status of Service:  Completed, signed off  Medicare Important Message Given:    Date Medicare IM Given:    Medicare IM give by:    Date Additional Medicare IM Given:    Additional Medicare Important Message give by:     If discussed at [place] of Stay Meetings, dates discussed:    Additional Comments:  Guadalupe Maple, RN 10/30/2014, 9:55 AM

## 2014-10-30 NOTE — Evaluation (Signed)
Occupational Therapy Evaluation Patient Details Name: [name] Harper MRN: [id number] DOB: May 16, 1960 Today's Date: 10/30/2014    History of Present Illness s/p R TKA   Clinical Impression   This 54 year old man was admitted for the above surgery.  He was independent with adls prior to admission and needs min A at this time.  His wife is unable to assist him at home.  Will follow in acute and recommend continued OT post-acute to reach mod I level.  Goals in acute are for supervision level overall    Follow Up Recommendations  SNF    Equipment Recommendations  None recommended by OT    Recommendations for Other Services       Precautions / Restrictions Precautions Precautions: Knee Restrictions Weight Bearing Restrictions: No      Mobility Bed Mobility               General bed mobility comments: oob  Transfers Overall transfer level: Needs assistance Equipment used: Rolling walker (2 wheeled) Transfers: Sit to/from Stand Sit to Stand: Min assist         General transfer comment: for safety; steadying    Balance                                            ADL Overall ADL's : Needs assistance/impaired     Grooming: Oral care;Set up;Standing       Lower Body Bathing: Minimal assistance;Sit to/from stand       Lower Body Dressing: Minimal assistance;Sit to/from stand   Toilet Transfer: Minimal assistance;Ambulation (chair)             General ADL Comments: Pt ambulated to bathroom and brushed teeth:  had LOB but self-corrected.  Min A for LB adls and set up assistance needed for UB.  Pt has tub/shower with seat.  Explained tub readiness.  Pt has a 3:1 commode at home     Vision     Perception     Praxis      Pertinent Vitals/Pain Pain Assessment: 0-10 Pain Score: 3  Pain Location: R knee Pain Descriptors / Indicators: Aching Pain Intervention(s): Limited activity within patient's tolerance;Monitored during  session;Premedicated before session;Repositioned     Hand Dominance     Extremity/Trunk Assessment Upper Extremity Assessment Upper Extremity Assessment: Overall WFL for tasks assessed           Communication Communication Communication: Prefers language other than [place];No difficulties   Cognition Arousal/Alertness: Awake/alert Behavior During Therapy: WFL for tasks assessed/performed Overall Cognitive Status: Within Functional Limits for tasks assessed                     General Comments       Exercises       Shoulder Instructions      Home Living Family/patient expects to be discharged to:: Skilled nursing facility                                 Additional Comments: wife has health issues and cannot assist pt      Prior Functioning/Environment Level of Independence: Independent             OT Diagnosis: Generalized weakness   OT Problem List: Impaired balance (sitting and/or standing);Pain;Decreased strength;Decreased knowledge of use of  DME or AE   OT Treatment/Interventions: Self-care/ADL training;DME and/or AE instruction;Patient/family education    OT Goals(Current goals can be found in the care plan section) Acute Rehab OT Goals Patient Stated Goal: rehab so that he will be independent prior to home; wife cannot assist OT Goal Formulation: With patient Time For Goal Achievement: 11/06/14 Potential to Achieve Goals: Good ADL Goals Pt Will Perform Grooming: with supervision;standing Pt Will Perform Lower Body Bathing: with supervision;with adaptive equipment;sit to/from stand Pt Will Perform Lower Body Dressing: with supervision;with adaptive equipment;sit to/from stand Pt Will Transfer to Toilet: with supervision;ambulating;bedside commode Pt Will Perform Toileting - Clothing Manipulation and hygiene: with supervision;sit to/from stand  OT Frequency: Min 2X/week   Barriers to D/C: Decreased caregiver support           Co-evaluation              End of Session    Activity Tolerance: Patient tolerated treatment well Patient left: in chair;with call bell/phone within reach   Time: 8466-5993 OT Time Calculation (min): 19 min Charges:  OT General Charges $OT Visit: 1 Procedure OT Evaluation $Initial OT Evaluation Tier I: 1 Procedure G-Codes:    Isabellarose Kope 2014/11/02, 11:22 AM  Lesle Chris, OTR/L [phone] 02-Nov-2014

## 2014-10-30 NOTE — Progress Notes (Signed)
Physical Therapy Treatment Patient Details Name: [name] Harper MRN: [id number] DOB: [birth year] Today's Date: 10/30/2014    History of Present Illness R TKR    PT Comments    Pt progressing well and eager for move to rehab and then home.  Follow Up Recommendations  SNF     Equipment Recommendations  None recommended by PT    Recommendations for Other Services OT consult     Precautions / Restrictions Precautions Precautions: Knee Required Braces or Orthoses: Knee Immobilizer - Right Knee Immobilizer - Right: Discontinue once straight leg raise with < 10 degree lag (Pt performed IND SLR this date) Restrictions Weight Bearing Restrictions: No Other Position/Activity Restrictions: WBAT    Mobility  Bed Mobility Overal bed mobility: Needs Assistance Bed Mobility: Sit to Supine       Sit to supine: Min assist   General bed mobility comments: cues for sequence and use of L LE to self assist  Transfers Overall transfer level: Needs assistance Equipment used: Rolling walker (2 wheeled) Transfers: Sit to/from Stand Sit to Stand: Min guard         General transfer comment: for safety; steadying  Ambulation/Gait Ambulation/Gait assistance: Min assist;Min guard Ambulation Distance (Feet): 78 Feet (twice) Assistive device: Rolling walker (2 wheeled) Gait Pattern/deviations: Step-to pattern;Step-through pattern;Decreased step length - right;Decreased step length - left;Shuffle;Trunk flexed     General Gait Details: cues for sequence, posture and position from [employer]            Wheelchair Mobility    Modified Rankin (Stroke Patients Only)       Balance                                    Cognition Arousal/Alertness: Awake/alert Behavior During Therapy: WFL for tasks assessed/performed Overall Cognitive Status: Within Functional Limits for tasks assessed                      Exercises Total Joint Exercises Ankle  Circles/Pumps: AROM;Both;15 reps;Supine Quad Sets: AROM;Both;10 reps;Supine Heel Slides: AAROM;Right;15 reps;Supine Straight Leg Raises: AAROM;AROM;Right;15 reps;Supine    General Comments        Pertinent Vitals/Pain Pain Assessment: 0-10 Pain Score: 3  Pain Location: R knee Pain Descriptors / Indicators: Aching;Sore Pain Intervention(s): Premedicated before session;Monitored during session;Limited activity within patient's tolerance;Ice applied    Home Living                      Prior Function            PT Goals (current goals can now be found in the care plan section) Acute Rehab PT Goals Patient Stated Goal: rehab so that he will be independent prior to home; wife cannot assist PT Goal Formulation: With patient Potential to Achieve Goals: Good Progress towards PT goals: Progressing toward goals    Frequency  7X/week    PT Plan Current plan remains appropriate    Co-evaluation             End of Session Equipment Utilized During Treatment: Gait belt Activity Tolerance: Patient tolerated treatment well Patient left: in bed;with call bell/phone within reach     Time: 1548-1611 PT Time Calculation (min) (ACUTE ONLY): 23 min  Charges:  $Gait Training: 8-22 mins $Therapeutic Exercise: 8-22 mins  G Codes:      [name] Harper 25-Nov-2014, 4:23 PM

## 2014-10-30 NOTE — Discharge Instructions (Addendum)
INSTRUCTIONS AFTER JOINT REPLACEMENT  ° °o Remove items at home which could result in a fall. This includes throw rugs or furniture in walking pathways °o ICE to the affected joint every three hours while awake for 30 minutes at a time, for at least the first 3-5 days, and then as needed for pain and swelling.  Continue to use ice for pain and swelling. You may notice swelling that will progress down to the foot and ankle.  This is normal after surgery.  Elevate your leg when you are not up walking on it.   °o Continue to use the breathing machine you got in the hospital (incentive spirometer) which will help keep your temperature down.  It is common for your temperature to cycle up and down following surgery, especially at night when you are not up moving around and exerting yourself.  The breathing machine keeps your lungs expanded and your temperature down. ° ° °DIET:  As you were doing prior to hospitalization, we recommend a well-balanced diet. ° °DRESSING / WOUND CARE / SHOWERING ° °Keep the surgical dressing until follow up.  The dressing is water proof, so you can shower without any extra covering.  IF THE DRESSING FALLS OFF or the wound gets wet inside, change the dressing with sterile gauze.  Please use good hand washing techniques before changing the dressing.  Do not use any lotions or creams on the incision until instructed by your surgeon.   ° °ACTIVITY ° °o Increase activity slowly as tolerated, but follow the weight bearing instructions below.   °o No driving for 6 weeks or until further direction given by your physician.  You cannot drive while taking narcotics.  °o No lifting or carrying greater than 10 lbs. until further directed by your surgeon. °o Avoid periods of inactivity such as sitting longer than an hour when not asleep. This helps prevent blood clots.  °o You may return to work once you are authorized by your doctor.  ° ° ° °WEIGHT BEARING  ° °Weight bearing as tolerated with assist  device (walker, cane, etc) as directed, use it as long as suggested by your surgeon or therapist, typically at least 4-6 weeks. ° ° °EXERCISES ° °Results after joint replacement surgery are often greatly improved when you follow the exercise, range of motion and muscle strengthening exercises prescribed by your doctor. Safety measures are also important to protect the joint from further injury. Any time any of these exercises cause you to have increased pain or swelling, decrease what you are doing until you are comfortable again and then slowly increase them. If you have problems or questions, call your caregiver or physical therapist for advice.  ° °Rehabilitation is important following a joint replacement. After just a few days of immobilization, the muscles of the leg can become weakened and shrink (atrophy).  These exercises are designed to build up the tone and strength of the thigh and leg muscles and to improve motion. Often times heat used for twenty to thirty minutes before working out will loosen up your tissues and help with improving the range of motion but do not use heat for the first two weeks following surgery (sometimes heat can increase post-operative swelling).  ° °These exercises can be done on a training (exercise) mat, on the floor, on a table or on a bed. Use whatever works the best and is most comfortable for you.    Use music or television while you are exercising so that   the exercises are a pleasant break in your day. This will make your life better with the exercises acting as a break in your routine that you can look forward to.   Perform all exercises about fifteen times, three times per day or as directed.  You should exercise both the operative leg and the other leg as well. ° °Exercises include: °  °• Quad Sets - Tighten up the muscle on the front of the thigh (Quad) and hold for 5-10 seconds.   °• Straight Leg Raises - With your knee straight (if you were given a brace, keep it on),  lift the leg to 60 degrees, hold for 3 seconds, and slowly lower the leg.  Perform this exercise against resistance later as your leg gets stronger.  °• Leg Slides: Lying on your back, slowly slide your foot toward your buttocks, bending your knee up off the floor (only go as far as is comfortable). Then slowly slide your foot back down until your leg is flat on the floor again.  °• Angel Wings: Lying on your back spread your legs to the side as far apart as you can without causing discomfort.  °• Hamstring Strength:  Lying on your back, push your heel against the floor with your leg straight by tightening up the muscles of your buttocks.  Repeat, but this time bend your knee to a comfortable angle, and push your heel against the floor.  You may put a pillow under the heel to make it more comfortable if necessary.  ° °A rehabilitation program following joint replacement surgery can speed recovery and prevent re-injury in the future due to weakened muscles. Contact your doctor or a physical therapist for more information on knee rehabilitation.  ° ° °CONSTIPATION ° °Constipation is defined medically as fewer than three stools per week and severe constipation as less than one stool per week.  Even if you have a regular bowel pattern at home, your normal regimen is likely to be disrupted due to multiple reasons following surgery.  Combination of anesthesia, postoperative narcotics, change in appetite and fluid intake all can affect your bowels.  ° °YOU MUST use at least one of the following options; they are listed in order of increasing strength to get the job done.  They are all available over the counter, and you may need to use some, POSSIBLY even all of these options:   ° °Drink plenty of fluids (prune juice may be helpful) and high fiber foods °Colace 100 mg by mouth twice a day  °Senokot for constipation as directed and as needed Dulcolax (bisacodyl), take with full glass of water  °Miralax (polyethylene glycol)  once or twice a day as needed. ° °If you have tried all these things and are unable to have a bowel movement in the first 3-4 days after surgery call either your surgeon or your primary doctor.   ° °If you experience loose stools or diarrhea, hold the medications until you stool forms back up.  If your symptoms do not get better within 1 week or if they get worse, check with your doctor.  If you experience "the worst abdominal pain ever" or develop nausea or vomiting, please contact the office immediately for further recommendations for treatment. ° ° °ITCHING:  If you experience itching with your medications, try taking only a single pain pill, or even half a pain pill at a time.  You can also use Benadryl over the counter for itching or also to   help with sleep.   TED HOSE STOCKINGS:  Use stockings on both legs until for at least 2 weeks or as directed by physician office. They may be removed at night for sleeping.  MEDICATIONS:  See your medication summary on the After Visit Summary that nursing will review with you.  You may have some home medications which will be placed on hold until you complete the course of blood thinner medication.  It is important for you to complete the blood thinner medication as prescribed.  PRECAUTIONS:  If you experience chest pain or shortness of breath - call 911 immediately for transfer to the hospital emergency department.   If you develop a fever greater that 101 F, purulent drainage from wound, increased redness or drainage from wound, foul odor from the wound/dressing, or calf pain - CONTACT YOUR SURGEON.                                                   FOLLOW-UP APPOINTMENTS:  If you do not already have a post-op appointment, please call the office for an appointment to be seen by your surgeon.  Guidelines for how soon to be seen are listed in your After Visit Summary, but are typically between 1-4 weeks after surgery.   MAKE SURE YOU:   Understand these  instructions.   Get help right away if you are not doing well or get worse.    Thank you for letting us be a part of your medical care team.  It is a privilege we respect greatly.  We hope these instructions will help you stay on track for a fast and full recovery!    Information on my medicine - XARELTO (Rivaroxaban)  This medication education was reviewed with me or my healthcare representative as part of my discharge preparation.  The pharmacist that spoke with me during my hospital stay was:  Biagio Borg, [facility]  Why was Xarelto prescribed for you? Xarelto was prescribed for you to reduce the risk of blood clots forming after orthopedic surgery. The medical term for these abnormal blood clots is venous thromboembolism (VTE).  What do you need to know about xarelto ? Take your Xarelto ONCE DAILY at the same time every day. You may take it either with or without food.  If you have difficulty swallowing the tablet whole, you may crush it and mix in applesauce just prior to taking your dose.  Take Xarelto exactly as prescribed by your doctor and DO NOT stop taking Xarelto without talking to the doctor who prescribed the medication.  Stopping without other VTE prevention medication to take the place of Xarelto may increase your risk of developing a clot.  After discharge, you should have regular check-up appointments with your healthcare provider that is prescribing your Xarelto.    What do you do if you miss a dose? If you miss a dose, take it as soon as you remember on the same day then continue your regularly scheduled once daily regimen the next day. Do not take two doses of Xarelto on the same day.   Important Safety Information A possible side effect of Xarelto is bleeding. You should call your healthcare provider right away if you experience any of the following: ? Bleeding from an injury or your nose that does not stop. ? Unusual colored urine (red  or dark  brown) or unusual colored stools (red or black). ? Unusual bruising for unknown reasons. ? A serious fall or if you hit your head (even if there is no bleeding).  Some medicines may interact with Xarelto and might increase your risk of bleeding while on Xarelto. To help avoid this, consult your healthcare provider or pharmacist prior to using any new prescription or non-prescription medications, including herbals, vitamins, non-steroidal anti-inflammatory drugs (NSAIDs) and supplements.  This website has more information on Xarelto: [email or web address]

## 2014-10-30 NOTE — Op Note (Signed)
NAMEOMA, ALPERT NO.:  [id number]  MEDICAL RECORD NO.:  [id number]  LOCATION:  24                         FACILITY:  [facility]  PHYSICIAN:  Kipp Brood. Bryen Hinderman, M.D.DATE OF BIRTH:  [birth year]  DATE OF PROCEDURE:  10/29/2014 DATE OF DISCHARGE:                              OPERATIVE REPORT   SURGEON:  Kipp Brood. Gladstone Lighter, M.D.  ASSISTANT:  Ardeen Jourdain, [place]  PREOPERATIVE DIAGNOSIS:  Severe primary osteoarthritis with bone-on- bone, right knee.  POSTOPERATIVE DIAGNOSIS:  Severe primary osteoarthritis with bone-on- bone, right knee.  OPERATION:  Right total knee arthroplasty utilizing the DePuy system.  DESCRIPTION OF PROCEDURE:  The appropriate time-out was carried out.  I also marked the appropriate right leg in the holding area.  After sterile prep and draping, the tourniquet was elevated at 325 mmHg.  The knee then was flexed and placed in the [facility] knee holder.  I then carried out the anterior approach of the knee, two flaps were created. I then reflected the patella laterally after doing a median parapatellar approach.  I then did medial and lateral meniscectomies and excised the anterior, posterior and cruciate ligaments.  At this time, a synovectomy then was carried out.  I then made my initial drill hole in the intercondylar notch.  The canal finder was inserted.  I then irrigated out the canal.  I then measured the femur to be a size 3 right.  The appropriate anterior, posterior and chamfering cuts were made for a size 3 right femur.  Then, attention was directed to the tibial plateau and drill hole was made in the plateau and at that point, the intramedullary guide was inserted and we removed 6-mm thickness off the affected side. After that, we then removed the device and then inserted the laminar spreaders and removed the posterior spurs off the femoral condyles.  At this point, we utilized our spacer blocks.  We elected to use a 12.5-mm spacer  block for stability.  We then went on and continued the preparation of the tibial plateau.  The medial plateau was very hard. After we did our keel cuts, some drill holes were made in the medial plateau for cement purchase.  Following that, after preparation of the keel cut, I then did a notch cut out of the distal femur in usual fashion.  The trial components were inserted and reduced with a 12.5-mm thickness insert.  At this time, I then did a resurfacing procedure on the patella for a size 35 patella.  Three drill holes were made in the articular surface of the patella.  All three trial components were removed.  I thoroughly water picked out the knee and then cemented all three components in simultaneously with gentamicin in the cement. Following that, we then removed all loose pieces of cement and water picked out the knee again.  I then injected 20 mL of 0.25% Marcaine plain into the soft tissue structures.  I then inserted my permanent rotating platform polyethylene prosthesis size 3, 12.5-mm thickness.  I reduced the knee, had good function.  I then inserted a Hemovac drain and closed the knee in layers in usual fashion after I irrigated again  with antibiotic solution.  At this point, we then used the mixture of 20 mL of Exparel with normal saline, 20 mL. The wound was closed in layers in usual fashion.  The patient had 2 g of IV Ancef preop and also tranexamic acid.          ______________________________ Kipp Brood. Gladstone Lighter, M.D.     RAG/MEDQ  D:  10/29/2014  T:  10/30/2014  Job:  [id number]

## 2014-10-30 NOTE — Evaluation (Signed)
Physical Therapy Evaluation Patient Details Name: [name] Harper MRN: [id number] DOB: Nov 03, 1960 Today's Date: 10/30/2014   History of Present Illness  R TKR  Clinical Impression  Pt s/p R TKR presents with decreased R LE strength/ROM and post op pain limiting functional mobility.  Pt would benefit from follow up rehab at SNF level to maximize IND prior to return home with very ltd assist.    Follow Up Recommendations SNF    Equipment Recommendations  None recommended by PT    Recommendations for Other Services OT consult     Precautions / Restrictions Precautions Precautions: Knee Required Braces or Orthoses: Knee Immobilizer - Right Knee Immobilizer - Right: Discontinue once straight leg raise with < 10 degree lag (pt performed IND SLR this date) Restrictions Weight Bearing Restrictions: No Other Position/Activity Restrictions: WBAT      Mobility  Bed Mobility Overal bed mobility: Needs Assistance Bed Mobility: Supine to Sit     Supine to sit: Min assist     General bed mobility comments: cues for sequence and use of L LE to self assist  Transfers Overall transfer level: Needs assistance Equipment used: Rolling walker (2 wheeled) Transfers: Sit to/from Stand Sit to Stand: Min assist         General transfer comment: for safety; steadying  Ambulation/Gait Ambulation/Gait assistance: Min assist Ambulation Distance (Feet): 47 Feet Assistive device: Rolling walker (2 wheeled) Gait Pattern/deviations: Step-to pattern;Decreased step length - right;Decreased step length - left;Shuffle;Trunk flexed     General Gait Details: cues for sequence, posture and position from [employer]            Wheelchair Mobility    Modified Rankin (Stroke Patients Only)       Balance                                             Pertinent Vitals/Pain Pain Assessment: 0-10 Pain Score: 3  Pain Location: R knee Pain Descriptors / Indicators:  Aching;Sore Pain Intervention(s): Limited activity within patient's tolerance;Monitored during session;Premedicated before session;Ice applied    Home Living Family/patient expects to be discharged to:: Skilled nursing facility Living Arrangements: Spouse/significant other               Additional Comments: wife has health issues and cannot assist pt    Prior Function Level of Independence: Independent               Hand Dominance        Extremity/Trunk Assessment   Upper Extremity Assessment: Overall WFL for tasks assessed           Lower Extremity Assessment: RLE deficits/detail RLE Deficits / Details: 3/5 quads with pt performing IND SLR; AAROM at knee -5 - 70    Cervical / Trunk Assessment: Normal  Communication   Communication: Prefers language other than [place];No difficulties  Cognition Arousal/Alertness: Awake/alert Behavior During Therapy: WFL for tasks assessed/performed Overall Cognitive Status: Within Functional Limits for tasks assessed                      General Comments      Exercises Total Joint Exercises Ankle Circles/Pumps: AROM;Both;15 reps;Supine Quad Sets: AROM;Both;10 reps;Supine Heel Slides: AAROM;Right;15 reps;Supine Straight Leg Raises: AAROM;AROM;Right;15 reps;Supine      Assessment/Plan    PT Assessment Patient needs continued PT services  PT Diagnosis Difficulty  walking   PT Problem List Decreased strength;Decreased range of motion;Decreased activity tolerance;Decreased knowledge of use of DME;Pain;Decreased mobility  PT Treatment Interventions DME instruction;Gait training;Stair training;Functional mobility training;Therapeutic activities;Therapeutic exercise;Patient/family education   PT Goals (Current goals can be found in the Care Plan section) Acute Rehab PT Goals Patient Stated Goal: rehab so that he will be independent prior to home; wife cannot assist PT Goal Formulation: With patient Potential to  Achieve Goals: Good    Frequency 7X/week   Barriers to discharge Decreased caregiver support Wife has health issues and can not assist    Co-evaluation               End of Session Equipment Utilized During Treatment: Gait belt Activity Tolerance: Patient tolerated treatment well Patient left: in chair;with call bell/phone within reach Nurse Communication: Mobility status         Time: 8916-9450 PT Time Calculation (min) (ACUTE ONLY): 30 min   Charges:   PT Evaluation $Initial PT Evaluation Tier I: 1 Procedure PT Treatments $Therapeutic Exercise: 8-22 mins   PT G Codes:        [name] Harper 2014-11-06, 11:28 AM

## 2014-10-31 LAB — CBC
HCT: 36.4 % — ABNORMAL LOW (ref 39.0–52.0)
Hemoglobin: 12.4 g/dL — ABNORMAL LOW (ref 13.0–17.0)
MCH: 30.4 pg (ref 26.0–34.0)
MCHC: 34.1 g/dL (ref 30.0–36.0)
MCV: 89.2 fL (ref 78.0–100.0)
Platelets: 169 10*3/uL (ref 150–400)
RBC: 4.08 MIL/uL — ABNORMAL LOW (ref 4.22–5.81)
RDW: 14 % (ref 11.5–15.5)
WBC: 9.4 10*3/uL (ref 4.0–10.5)

## 2014-10-31 LAB — BASIC METABOLIC PANEL
Anion gap: 9 (ref 5–15)
BUN: 17 mg/dL (ref 6–20)
CALCIUM: 8.9 mg/dL (ref 8.9–10.3)
CO2: 32 mmol/L (ref 22–32)
CREATININE: 0.84 mg/dL (ref 0.61–1.24)
Chloride: 99 mmol/L — ABNORMAL LOW (ref 101–111)
GFR calc non Af Amer: >60 mL/min (ref >60)
Glucose, Bld: 108 mg/dL — ABNORMAL HIGH (ref 65–99)
Potassium: 3.8 mmol/L (ref 3.5–5.1)
SODIUM: 140 mmol/L (ref 135–145)

## 2014-10-31 MED ORDER — RIVAROXABAN 10 MG PO TABS: 10.0000 mg | ORAL_TABLET | Freq: Every day | ORAL | Status: DC

## 2014-10-31 MED ORDER — METHOCARBAMOL 500 MG PO TABS: 500.0000 mg | ORAL_TABLET | Freq: Four times a day (QID) | ORAL | Status: DC | PRN

## 2014-10-31 MED ORDER — HYDROCODONE-ACETAMINOPHEN 5-325 MG PO TABS: 1.0000 | ORAL_TABLET | ORAL | Status: DC | PRN

## 2014-10-31 MED ORDER — OXYCODONE-ACETAMINOPHEN 5-325 MG PO TABS: 1.0000 | ORAL_TABLET | ORAL | Status: DC | PRN

## 2014-10-31 NOTE — Progress Notes (Signed)
Physical Therapy Treatment Patient Details Name: SANAD FEARNOW MRN: [id number] DOB: 10-Feb-1961 Today's Date: November 25, 2014    History of Present Illness R TKR    PT Comments    Pt will benefit from SNF to allow return to independence; pt is motivated and should progress well  Follow Up Recommendations  SNF     Equipment Recommendations  None recommended by PT    Recommendations for Other Services       Precautions / Restrictions Precautions Precautions: Knee Required Braces or Orthoses: Knee Immobilizer - Right Knee Immobilizer - Right: Discontinue once straight leg raise with < 10 degree lag (IND SLR today) Restrictions Weight Bearing Restrictions: No Other Position/Activity Restrictions: WBAT    Mobility  Bed Mobility               General bed mobility comments: in recliner  Transfers Overall transfer level: Needs assistance Equipment used: Rolling walker (2 wheeled) Transfers: Sit to/from Stand Sit to Stand: Min guard;Supervision         General transfer comment: verbal cues for hand placement and safety  Ambulation/Gait Ambulation/Gait assistance: Min guard Ambulation Distance (Feet): 100 Feet Assistive device: Rolling walker (2 wheeled) Gait Pattern/deviations: Step-to pattern;Step-through pattern;Antalgic     General Gait Details: cues for sequence, posture and position from RW, working on heel strike and equal wt shift   Stairs            Wheelchair Mobility    Modified Rankin (Stroke Patients Only)       Balance                                    Cognition Arousal/Alertness: Awake/alert Behavior During Therapy: WFL for tasks assessed/performed Overall Cognitive Status: Within Functional Limits for tasks assessed                      Exercises Total Joint Exercises Ankle Circles/Pumps: AROM;Both;15 reps Quad Sets: AROM;Both;10 reps Heel Slides: AAROM;Right;10 reps Hip ABduction/ADduction:  AROM;Strengthening;Right;10 reps Straight Leg Raises: AROM;Right;10 reps Goniometric ROM: grossly 10 to 75* AAROM    General Comments        Pertinent Vitals/Pain Pain Assessment: 0-10 Pain Score: 1  Pain Location: R knee Pain Descriptors / Indicators: Aching Pain Intervention(s): Limited activity within patient's tolerance;Monitored during session;Premedicated before session    Home Living                      Prior Function            PT Goals (current goals can now be found in the care plan section) Acute Rehab PT Goals Patient Stated Goal: rehab so that he will be independent prior to home; wife cannot assist PT Goal Formulation: With patient Potential to Achieve Goals: Good Progress towards PT goals: Progressing toward goals    Frequency  7X/week    PT Plan Current plan remains appropriate    Co-evaluation             End of Session   Activity Tolerance: Patient tolerated treatment well Patient left: in chair;with call bell/phone within reach     Time: 1136-1156 PT Time Calculation (min) (ACUTE ONLY): 20 min  Charges:  $Gait Training: 8-22 mins                    G CodesKenyon Ana 11-25-14,  12:04 PM

## 2014-10-31 NOTE — Clinical Social Work Placement (Signed)
   CLINICAL SOCIAL WORK PLACEMENT  NOTE  Date:  10/31/2014  Patient Details  Name: [name] Harper MRN: [id number] Date of Birth: 05-11-1960  Clinical Social Work is seeking post-discharge placement for this patient at the [place] level of care (*CSW will initial, date and re-position this form in  chart as items are completed):  No   Patient/family provided with [facility] Work Department's list of facilities offering this level of care within the geographic area requested by the patient (or if unable, by the patient's family).  Yes   Patient/family informed of their freedom to choose among providers that offer the needed level of care, that participate in Medicare, Medicaid or managed care program needed by the patient, have an available bed and are willing to accept the patient.  Yes   Patient/family informed of [facility]'s ownership interest in [facility] and [facility], as well as of the fact that they are under no obligation to receive care at these facilities.  PASRR submitted to EDS on 10/29/14     PASRR number received on       Existing PASRR number confirmed on       FL2 transmitted to all facilities in geographic area requested by pt/family on 10/29/14     FL2 transmitted to all facilities within larger geographic area on       Patient informed that his/her managed care company has contracts with or will negotiate with certain facilities, including the following:        Yes   Patient/family informed of bed offers received.  Patient chooses bed at [facility]     Physician recommends and patient chooses bed at      Patient to be transferred to [facility] on 10/31/14.  Patient to be transferred to facility by [facility]     Patient family notified on 10/31/14 of transfer.  Name of family member notified:  SPOUSE     PHYSICIAN       Additional Comment: Pt / spouse were in agreement with D/C to  [facility] today. PT approved transport by car. D.C Summary, scripts, avs,  reviewed by nsg. D/C Summary sent to SNF for review prior to d/c. D/C packet provided to pt prior to d/c. [employer] adv medicare provided authorization for SNF placement.   _______________________________________________ Luretha Rued, [place] 10/31/2014, 6:26 PM

## 2014-10-31 NOTE — Discharge Summary (Signed)
Physician Discharge Summary   Patient ID: [name] Harper MRN: [id number] DOB/AGE: Aug 26, 1960 54 y.o.  Admit date: 10/29/2014 Discharge date: 10/31/2014  Primary Diagnosis: Primary osteoarthritis right knee   Admission Diagnoses:  Past Medical History  Diagnosis Date  . Bronchitis   . Hypertension   . Hypercholesterolemia   . Complication of anesthesia   . PONV (postoperative nausea and vomiting)   . Arthritis   . Depression   . Anxiety    Discharge Diagnoses:   Active Problems:   History of total knee arthroplasty  Estimated body mass index is 32.78 kg/(m^2) as calculated from the following:   Height as of this encounter: _0  (1.6 m).   Weight as of this encounter: 83.915 kg (185 lb).  Procedure:  Procedure(s) (LRB): RIGHT TOTAL KNEE ARTHROPLASTY (Right)   Consults: None  HPI: [name] Harper, 54 y.o. male, has a history of pain and functional disability in the right knee due to arthritis and has failed non-surgical conservative treatments for greater than 12 weeks to includeNSAID's and/or analgesics, corticosteriod injections, flexibility and strengthening excercises and activity modification. Onset of symptoms was gradual, starting 2 years ago with gradually worsening course since that time. The patient noted prior procedures on the knee to include arthroscopy and menisectomy on the right knee(s). Patient currently rates pain in the right knee(s) at 7 out of 10 with activity. Patient has night pain, worsening of pain with activity and weight bearing, pain that interferes with activities of daily living, pain with passive range of motion, crepitus and joint swelling. Patient has evidence of periarticular osteophytes and joint space narrowing by imaging studies.There is no active infection.  Laboratory Data: Admission on 10/29/2014  Component Date Value Ref Range Status  . WBC 10/30/2014 12.4* 4.0 - 10.5 K/uL Final  . RBC 10/30/2014 4.19* 4.22 - 5.81 MIL/uL Final  .  Hemoglobin 10/30/2014 12.4* 13.0 - 17.0 g/dL Final  . HCT 10/30/2014 36.9* 39.0 - 52.0 % Final  . MCV 10/30/2014 88.1  78.0 - 100.0 fL Final  . MCH 10/30/2014 29.6  26.0 - 34.0 pg Final  . MCHC 10/30/2014 33.6  30.0 - 36.0 g/dL Final  . RDW 10/30/2014 13.7  11.5 - 15.5 % Final  . Platelets 10/30/2014 185  150 - 400 K/uL Final  . Sodium 10/30/2014 135  135 - 145 mmol/L Final  . Potassium 10/30/2014 4.2  3.5 - 5.1 mmol/L Final  . Chloride 10/30/2014 99* 101 - 111 mmol/L Final  . CO2 10/30/2014 28  22 - 32 mmol/L Final  . Glucose, Bld 10/30/2014 141* 65 - 99 mg/dL Final  . BUN 10/30/2014 18  6 - 20 mg/dL Final  . Creatinine, Ser 10/30/2014 0.82  0.61 - 1.24 mg/dL Final  . Calcium 10/30/2014 9.0  8.9 - 10.3 mg/dL Final  . GFR calc non Af Amer 10/30/2014 >60  >60 mL/min Final  . GFR calc Af Amer 10/30/2014 >60  >60 mL/min Final   Comment: (NOTE) The eGFR has been calculated using the CKD EPI equation. This calculation has not been validated in all clinical situations. eGFR's persistently <60 mL/min signify possible Chronic Kidney Disease.   . Anion gap 10/30/2014 8  5 - 15 Final  . WBC 10/31/2014 9.4  4.0 - 10.5 K/uL Final  . RBC 10/31/2014 4.08* 4.22 - 5.81 MIL/uL Final  . Hemoglobin 10/31/2014 12.4* 13.0 - 17.0 g/dL Final  . HCT 10/31/2014 36.4* 39.0 - 52.0 % Final  . MCV 10/31/2014 89.2  78.0 -  100.0 fL Final  . MCH 10/31/2014 30.4  26.0 - 34.0 pg Final  . MCHC 10/31/2014 34.1  30.0 - 36.0 g/dL Final  . RDW 10/31/2014 14.0  11.5 - 15.5 % Final  . Platelets 10/31/2014 169  150 - 400 K/uL Final  . Sodium 10/31/2014 140  135 - 145 mmol/L Final  . Potassium 10/31/2014 3.8  3.5 - 5.1 mmol/L Final  . Chloride 10/31/2014 99* 101 - 111 mmol/L Final  . CO2 10/31/2014 32  22 - 32 mmol/L Final  . Glucose, Bld 10/31/2014 108* 65 - 99 mg/dL Final  . BUN 10/31/2014 17  6 - 20 mg/dL Final  . Creatinine, Ser 10/31/2014 0.84  0.61 - 1.24 mg/dL Final  . Calcium 10/31/2014 8.9  8.9 - 10.3 mg/dL  Final  . GFR calc non Af Amer 10/31/2014 >60  >60 mL/min Final  . GFR calc Af Amer 10/31/2014 >60  >60 mL/min Final   Comment: (NOTE) The eGFR has been calculated using the CKD EPI equation. This calculation has not been validated in all clinical situations. eGFR's persistently <60 mL/min signify possible Chronic Kidney Disease.   Georgiann Hahn gap 10/31/2014 9  5 - 15 Final  Hospital Outpatient Visit on 10/21/2014  Component Date Value Ref Range Status  . MRSA, PCR 10/21/2014 NEGATIVE  NEGATIVE Final  . Staphylococcus aureus 10/21/2014 NEGATIVE  NEGATIVE Final   Comment:        The Xpert SA Assay (FDA approved for NASAL specimens in patients over 29 years of age), is one component of a comprehensive surveillance program.  Test performance has been validated by [facility] for patients greater than or equal to 49 year old. It is not intended to diagnose infection nor to guide or monitor treatment.   Marland Kitchen aPTT 10/21/2014 29  24 - 37 seconds Final  . WBC 10/21/2014 7.6  4.0 - 10.5 K/uL Final  . RBC 10/21/2014 5.25  4.22 - 5.81 MIL/uL Final  . Hemoglobin 10/21/2014 15.8  13.0 - 17.0 g/dL Final  . HCT 10/21/2014 46.0  39.0 - 52.0 % Final  . MCV 10/21/2014 87.6  78.0 - 100.0 fL Final  . MCH 10/21/2014 30.1  26.0 - 34.0 pg Final  . MCHC 10/21/2014 34.3  30.0 - 36.0 g/dL Final  . RDW 10/21/2014 13.5  11.5 - 15.5 % Final  . Platelets 10/21/2014 209  150 - 400 K/uL Final  . Neutrophils Relative % 10/21/2014 46  43 - 77 % Final  . Neutro Abs 10/21/2014 3.6  1.7 - 7.7 K/uL Final  . Lymphocytes Relative 10/21/2014 41  12 - 46 % Final  . Lymphs Abs 10/21/2014 3.1  0.7 - 4.0 K/uL Final  . Monocytes Relative 10/21/2014 10  3 - 12 % Final  . Monocytes Absolute 10/21/2014 0.7  0.1 - 1.0 K/uL Final  . Eosinophils Relative 10/21/2014 3  0 - 5 % Final  . Eosinophils Absolute 10/21/2014 0.2  0.0 - 0.7 K/uL Final  . Basophils Relative 10/21/2014 0  0 - 1 % Final  . Basophils Absolute 10/21/2014 0.0   0.0 - 0.1 K/uL Final  . Sodium 10/21/2014 140  135 - 145 mmol/L Final  . Potassium 10/21/2014 3.8  3.5 - 5.1 mmol/L Final  . Chloride 10/21/2014 98* 101 - 111 mmol/L Final  . CO2 10/21/2014 30  22 - 32 mmol/L Final  . Glucose, Bld 10/21/2014 81  65 - 99 mg/dL Final  . BUN 10/21/2014 11  6 - 20  mg/dL Final  . Creatinine, Ser 10/21/2014 0.80  0.61 - 1.24 mg/dL Final  . Calcium 10/21/2014 9.5  8.9 - 10.3 mg/dL Final  . Total Protein 10/21/2014 8.3* 6.5 - 8.1 g/dL Final  . Albumin 10/21/2014 5.2* 3.5 - 5.0 g/dL Final  . AST 10/21/2014 45* 15 - 41 U/L Final  . ALT 10/21/2014 47  17 - 63 U/L Final  . Alkaline Phosphatase 10/21/2014 81  38 - 126 U/L Final  . Total Bilirubin 10/21/2014 0.8  0.3 - 1.2 mg/dL Final  . GFR calc non Af Amer 10/21/2014 >60  >60 mL/min Final  . GFR calc Af Amer 10/21/2014 >60  >60 mL/min Final   Comment: (NOTE) The eGFR has been calculated using the CKD EPI equation. This calculation has not been validated in all clinical situations. eGFR's persistently <60 mL/min signify possible Chronic Kidney Disease.   . Anion gap 10/21/2014 12  5 - 15 Final  . Prothrombin Time 10/21/2014 13.5  11.6 - 15.2 seconds Final  . INR 10/21/2014 1.01  0.00 - 1.49 Final  . ABO/RH(D) 10/21/2014 O POS   Final  . Antibody Screen 10/21/2014 NEG   Final  . Sample Expiration 10/21/2014 11/01/2014   Final  . Color, Urine 10/21/2014 YELLOW  YELLOW Final  . APPearance 10/21/2014 CLEAR  CLEAR Final  . Specific Gravity, Urine 10/21/2014 1.007  1.005 - 1.030 Final  . pH 10/21/2014 7.5  5.0 - 8.0 Final  . Glucose, UA 10/21/2014 NEGATIVE  NEGATIVE mg/dL Final  . Hgb urine dipstick 10/21/2014 NEGATIVE  NEGATIVE Final  . Bilirubin Urine 10/21/2014 NEGATIVE  NEGATIVE Final  . Ketones, ur 10/21/2014 NEGATIVE  NEGATIVE mg/dL Final  . Protein, ur 10/21/2014 NEGATIVE  NEGATIVE mg/dL Final  . Urobilinogen, UA 10/21/2014 0.2  0.0 - 1.0 mg/dL Final  . Nitrite 10/21/2014 NEGATIVE  NEGATIVE Final  .  Leukocytes, UA 10/21/2014 NEGATIVE  NEGATIVE Final   MICROSCOPIC NOT DONE ON URINES WITH NEGATIVE PROTEIN, BLOOD, LEUKOCYTES, NITRITE, OR GLUCOSE <1000 mg/dL.  . ABO/RH(D) 10/21/2014 O POS   Final     EKG: Orders placed or performed during the hospital encounter of 10/21/14  . EKG  . EKG     Hospital Course: [name] Harper is a 54 y.o. who was admitted to [facility]. They were brought to the operating room on 10/29/2014 and underwent Procedure(s): RIGHT TOTAL KNEE ARTHROPLASTY.  Patient tolerated the procedure well and was later transferred to the recovery room and then to the orthopaedic floor for postoperative care.  They were given PO and IV analgesics for pain control following their surgery.  They were given 24 hours of postoperative antibiotics of  Anti-infectives    Start     Dose/Rate Route Frequency Ordered Stop   10/29/14 1400  ceFAZolin (ANCEF) IVPB 1 g/50 mL premix     1 g 100 mL/hr over 30 Minutes Intravenous Every 6 hours 10/29/14 1235 10/29/14 2144   10/29/14 1009  polymyxin B 500,000 Units, bacitracin 50,000 Units in sodium chloride irrigation 0.9 % 500 mL irrigation  Status:  Discontinued       As needed 10/29/14 1009 10/29/14 1047   10/29/14 0617  ceFAZolin (ANCEF) IVPB 2 g/50 mL premix     2 g 100 mL/hr over 30 Minutes Intravenous On call to O.R. 10/29/14 1157 10/29/14 0855     and started on DVT prophylaxis in the form of Xarelto.   PT and OT were ordered for total joint protocol.  Discharge  planning consulted to help with postop disposition and equipment needs.  Patient had a good night on the evening of surgery.  They started to get up OOB with therapy on day one. Hemovac drain was pulled without difficulty.  Continued to work with therapy into day two.  The patient had progressed with therapy and meeting their goals. Patient was seen in rounds and was ready to go to SNF   Diet: Cardiac diet Activity:WBAT Follow-up:in 2 weeks Disposition - Skilled nursing  facility Discharged Condition: stable   Discharge Instructions    Call MD / Call 911    Complete by:  As directed   If you experience chest pain or shortness of breath, CALL 911 and be transported to the hospital emergency room.  If you develope a fever above 101 F, pus (white drainage) or increased drainage or redness at the wound, or calf pain, call your surgeon's office.     Constipation Prevention    Complete by:  As directed   Drink plenty of fluids.  Prune juice may be helpful.  You may use a stool softener, such as Colace (over the counter) 100 mg twice a day.  Use MiraLax (over the counter) for constipation as needed.     Diet - low sodium heart healthy    Complete by:  As directed      Discharge instructions    Complete by:  As directed   INSTRUCTIONS AFTER JOINT REPLACEMENT   Remove items at home which could result in a fall. This includes throw rugs or furniture in walking pathways ICE to the affected joint every three hours while awake for 30 minutes at a time, for at least the first 3-5 days, and then as needed for pain and swelling.  Continue to use ice for pain and swelling. You may notice swelling that will progress down to the foot and ankle.  This is normal after surgery.  Elevate your leg when you are not up walking on it.   Continue to use the breathing machine you got in the hospital (incentive spirometer) which will help keep your temperature down.  It is common for your temperature to cycle up and down following surgery, especially at night when you are not up moving around and exerting yourself.  The breathing machine keeps your lungs expanded and your temperature down.   DIET:  As you were doing prior to hospitalization, we recommend a well-balanced diet.  DRESSING / WOUND CARE / SHOWERING  Keep the surgical dressing until follow up.  The dressing is water proof, so you can shower without any extra covering.  IF THE DRESSING FALLS OFF or the wound gets wet inside,  change the dressing with sterile gauze.  Please use good hand washing techniques before changing the dressing.  Do not use any lotions or creams on the incision until instructed by your surgeon.    ACTIVITY  Increase activity slowly as tolerated, but follow the weight bearing instructions below.   No driving for 6 weeks or until further direction given by your physician.  You cannot drive while taking narcotics.  No lifting or carrying greater than 10 lbs. until further directed by your surgeon. Avoid periods of inactivity such as sitting longer than an hour when not asleep. This helps prevent blood clots.  You may return to work once you are authorized by your doctor.     WEIGHT BEARING   Weight bearing as tolerated with assist device (walker, cane, etc) as directed,  use it as long as suggested by your surgeon or therapist, typically at least 4-6 weeks.   EXERCISES  Results after joint replacement surgery are often greatly improved when you follow the exercise, range of motion and muscle strengthening exercises prescribed by your doctor. Safety measures are also important to protect the joint from further injury. Any time any of these exercises cause you to have increased pain or swelling, decrease what you are doing until you are comfortable again and then slowly increase them. If you have problems or questions, call your caregiver or physical therapist for advice.   Rehabilitation is important following a joint replacement. After just a few days of immobilization, the muscles of the leg can become weakened and shrink (atrophy).  These exercises are designed to build up the tone and strength of the thigh and leg muscles and to improve motion. Often times heat used for twenty to thirty minutes before working out will loosen up your tissues and help with improving the range of motion but do not use heat for the first two weeks following surgery (sometimes heat can increase post-operative  swelling).   These exercises can be done on a training (exercise) mat, on the floor, on a table or on a bed. Use whatever works the best and is most comfortable for you.    Use music or television while you are exercising so that the exercises are a pleasant break in your day. This will make your life better with the exercises acting as a break in your routine that you can look forward to.   Perform all exercises about fifteen times, three times per day or as directed.  You should exercise both the operative leg and the other leg as well.  Exercises include:   Quad Sets - Tighten up the muscle on the front of the thigh (Quad) and hold for 5-10 seconds.   Straight Leg Raises - With your knee straight (if you were given a brace, keep it on), lift the leg to 60 degrees, hold for 3 seconds, and slowly lower the leg.  Perform this exercise against resistance later as your leg gets stronger.  Leg Slides: Lying on your back, slowly slide your foot toward your buttocks, bending your knee up off the floor (only go as far as is comfortable). Then slowly slide your foot back down until your leg is flat on the floor again.  Angel Wings: Lying on your back spread your legs to the side as far apart as you can without causing discomfort.  Hamstring Strength:  Lying on your back, push your heel against the floor with your leg straight by tightening up the muscles of your buttocks.  Repeat, but this time bend your knee to a comfortable angle, and push your heel against the floor.  You may put a pillow under the heel to make it more comfortable if necessary.   A rehabilitation program following joint replacement surgery can speed recovery and prevent re-injury in the future due to weakened muscles. Contact your doctor or a physical therapist for more information on knee rehabilitation.    CONSTIPATION  Constipation is defined medically as fewer than three stools per week and severe constipation as less than one stool  per week.  Even if you have a regular bowel pattern at home, your normal regimen is likely to be disrupted due to multiple reasons following surgery.  Combination of anesthesia, postoperative narcotics, change in appetite and fluid intake all can affect your bowels.  YOU MUST use at least one of the following options; they are listed in order of increasing strength to get the job done.  They are all available over the counter, and you may need to use some, POSSIBLY even all of these options:    Drink plenty of fluids (prune juice may be helpful) and high fiber foods Colace 100 mg by mouth twice a day  Senokot for constipation as directed and as needed Dulcolax (bisacodyl), take with full glass of water  Miralax (polyethylene glycol) once or twice a day as needed.  If you have tried all these things and are unable to have a bowel movement in the first 3-4 days after surgery call either your surgeon or your primary doctor.    If you experience loose stools or diarrhea, hold the medications until you stool forms back up.  If your symptoms do not get better within 1 week or if they get worse, check with your doctor.  If you experience "the worst abdominal pain ever" or develop nausea or vomiting, please contact the office immediately for further recommendations for treatment.   ITCHING:  If you experience itching with your medications, try taking only a single pain pill, or even half a pain pill at a time.  You can also use Benadryl over the counter for itching or also to help with sleep.   TED HOSE STOCKINGS:  Use stockings on both legs until for at least 2 weeks or as directed by physician office. They may be removed at night for sleeping.  MEDICATIONS:  See your medication summary on the "After Visit Summary" that nursing will review with you.  You may have some home medications which will be placed on hold until you complete the course of blood thinner medication.  It is important for you to  complete the blood thinner medication as prescribed.  PRECAUTIONS:  If you experience chest pain or shortness of breath - call 911 immediately for transfer to the hospital emergency department.   If you develop a fever greater that 101 F, purulent drainage from wound, increased redness or drainage from wound, foul odor from the wound/dressing, or calf pain - CONTACT YOUR SURGEON.                                                   FOLLOW-UP APPOINTMENTS:  If you do not already have a post-op appointment, please call the office for an appointment to be seen by your surgeon.  Guidelines for how soon to be seen are listed in your "After Visit Summary", but are typically between 1-4 weeks after surgery.   MAKE SURE YOU:  Understand these instructions.  Get help right away if you are not doing well or get worse.    Thank you for letting us be a part of your medical care team.  It is a privilege we respect greatly.  We hope these instructions will help you stay on track for a fast and full recovery!     Increase activity slowly as tolerated    Complete by:  As directed             Medication List    STOP taking these medications        azithromycin 250 MG tablet  Commonly known as:  ZITHROMAX     naproxen 500 MG  tablet  Commonly known as:  NAPROSYN     OSTEO BI-FLEX JOINT SHIELD PO     predniSONE 10 MG tablet  Commonly known as:  DELTASONE      TAKE these medications        azelastine 0.1 % nasal spray  Commonly known as:  ASTELIN  Place 2 sprays into both nostrils 2 (two) times daily.     citalopram 20 MG tablet  Commonly known as:  CELEXA  Take 20 mg by mouth 2 (two) times daily.     clonazePAM 0.5 MG tablet  Commonly known as:  KLONOPIN  Take 0.5 mg by mouth every 8 (eight) hours as needed. anxiety     hydrochlorothiazide 25 MG tablet  Commonly known as:  HYDRODIURIL  Take 25 mg by mouth every morning.     HYDROcodone-acetaminophen 5-325 MG per tablet  Commonly  known as:  NORCO/VICODIN  Take 1-2 tablets by mouth every 4 (four) hours as needed for moderate pain.     methocarbamol 500 MG tablet  Commonly known as:  ROBAXIN  Take 1 tablet (500 mg total) by mouth every 6 (six) hours as needed for muscle spasms.     montelukast 10 MG tablet  Commonly known as:  SINGULAIR  Take 10 mg by mouth every evening.     NASONEX 50 MCG/ACT nasal spray  Generic drug:  mometasone  Place 2 sprays into both nostrils daily.     oxyCODONE-acetaminophen 5-325 MG per tablet  Commonly known as:  PERCOCET/ROXICET  Take 1-2 tablets by mouth every 4 (four) hours as needed for severe pain.     rivaroxaban 10 MG Tabs tablet  Commonly known as:  XARELTO  Take 1 tablet (10 mg total) by mouth daily with breakfast.     simvastatin 20 MG tablet  Commonly known as:  ZOCOR  Take 20 mg by mouth at bedtime.     testosterone 50 MG/5GM (1%) Gel  Commonly known as:  ANDROGEL  Place 5 g onto the skin daily.     ANDROGEL 20.25 MG/1.25GM (1.62%) Gel  Generic drug:  Testosterone  Place 20.25 mg onto the skin daily.           Follow-up Information    Follow up with GIOFFRE,RONALD A, MD. Schedule an appointment as soon as possible for a visit in 2 weeks.   Specialty:  Orthopedic Surgery   Contact information:   [address] [place] [address] [phone]       Signed: Ardeen Jourdain, PA-C Orthopaedic Surgery 10/31/2014, 7:19 AM

## 2014-10-31 NOTE — Progress Notes (Signed)
Subjective: 2 Days Post-Op Procedure(s) (LRB): RIGHT TOTAL KNEE ARTHROPLASTY (Right) Patient reports pain as 1 on 0-10 scale.Doing well today. He states that he cannot go home becayse of lack of help. He wants to go to SNF to recover.   Objective: Vital signs in last 24 hours: Temp:  [97.4 F (36.3 C)-98.8 F (37.1 C)] 98.8 F (37.1 C) (09/09 0646) Pulse Rate:  [67-92] 69 (09/09 0646) Resp:  [16-18] 16 (09/09 0646) BP: (111-130)/(70-80) 127/80 mmHg (09/09 0646) SpO2:  [96 %-97 %] 97 % (09/09 0646)  Intake/Output from previous day: 09/08 0701 - 09/09 0700 In: 2015.7 [P.O.:1200; I.V.:815.7] Out: 2675 [Urine:2675] Intake/Output this shift:     Recent Labs  10/30/14 0503 10/31/14 0434  HGB 12.4* 12.4*    Recent Labs  10/30/14 0503 10/31/14 0434  WBC 12.4* 9.4  RBC 4.19* 4.08*  HCT 36.9* 36.4*  PLT 185 169    Recent Labs  10/30/14 0503 10/31/14 0434  NA 135 140  K 4.2 3.8  CL 99* 99*  CO2 28 32  BUN 18 17  CREATININE 0.82 0.84  GLUCOSE 141* 108*  CALCIUM 9.0 8.9   No results for input(s): LABPT, INR in the last 72 hours.  Neurologically intact No cellulitis present Compartment soft  Assessment/Plan: 2 Days Post-Op Procedure(s) (LRB): RIGHT TOTAL KNEE ARTHROPLASTY (Right) Up with therapy Discharge to SNF    in two weeks.  Adisa Vigeant A 10/31/2014, 7:09 AM

## 2014-10-31 NOTE — Progress Notes (Signed)
RN called report to Alvin Critchley, LPN at [facility]. All questions answered.   Patient transported via wife to SNF. Packet sent with patient.

## 2014-11-27 ENCOUNTER — Other Ambulatory Visit: Payer: Self-pay | Admitting: Allergy and Immunology

## 2015-01-27 ENCOUNTER — Encounter: Payer: Self-pay | Admitting: Allergy and Immunology

## 2015-01-27 ENCOUNTER — Ambulatory Visit (INDEPENDENT_AMBULATORY_CARE_PROVIDER_SITE_OTHER): Payer: PPO | Admitting: Allergy and Immunology

## 2015-01-27 VITALS — BP 154/96 | HR 90 | Temp 98.7°F | Resp 18

## 2015-01-27 DIAGNOSIS — R05 Cough: Secondary | ICD-10-CM | POA: Diagnosis not present

## 2015-01-27 DIAGNOSIS — R059 Cough, unspecified: Secondary | ICD-10-CM

## 2015-01-27 DIAGNOSIS — J309 Allergic rhinitis, unspecified: Secondary | ICD-10-CM | POA: Diagnosis not present

## 2015-01-27 DIAGNOSIS — H1010 Acute atopic conjunctivitis, unspecified eye: Secondary | ICD-10-CM | POA: Diagnosis not present

## 2015-01-27 MED ORDER — MOMETASONE FUROATE 50 MCG/ACT NA SUSP: 2.0000 | Freq: Every day | NASAL | Status: DC

## 2015-01-27 MED ORDER — MONTELUKAST SODIUM 10 MG PO TABS: 10.0000 mg | ORAL_TABLET | Freq: Every evening | ORAL | Status: DC

## 2015-01-27 MED ORDER — AZELASTINE HCL 0.1 % NA SOLN: NASAL | Status: DC

## 2015-01-27 NOTE — Progress Notes (Signed)
FOLLOW UP NOTE  RE: [name] Harper MRN: [id number] DOB: [birth year] [facility] [place] [address] [place], [place] [address] Date of Office Visit: 01/27/2015  Subjective:  [name] Harper is a 54 y.o. male who presents today for Follow-up  Assessment:   1. Cough   2. Allergic rhinoconjunctivitis   3.      Complex medical history including hypertension on daily medication. Plan:   1.  [name] Harper will Continue his current medication regime as it working well for him. 2.  With any increasing nasal symptoms increase Azelastine to 2 sprays twice daily. 3.  Nasal Saline wash followed by nasal spray two - three times daily as directed. 4.  Communicate with office if any recurring symptoms, questions or concerns. 5.  Follow up Visit:  6 months or sooner if needed and follow closely with primary regarding BP possible changes to medications.  Meds ordered this encounter  Medications  . azelastine (ASTELIN) 0.1 % nasal spray    Sig: Use 1-2 sprays each nostril once to twice daily.    Dispense:  30 mL    Refill:  5  . mometasone (NASONEX) 50 MCG/ACT nasal spray    Sig: Place 2 sprays into the nose daily.    Dispense:  17 g    Refill:  5  . montelukast (SINGULAIR) 10 MG tablet    Sig: Take 1 tablet (10 mg total) by mouth every evening.    Dispense:  30 tablet    Refill:  3    HPI:  Awesome returns to the office with his wife in follow-up of Allergic rhinoconjunctivitis and cough.  He reports since his last visit in June, feeling very good and is pleased with his medication regime and prefers to hold off any initiating immunotherapy at this time.  He denies any urgent care or ED visits, prednisone or antibiotic courses.  With any increasing nasal congestion or drainage he has increased the Astelin to twice daily which is beneficial.  Denies recurrent congestion, drainage, headache, sorethroat, cough or any chest symptoms.  He does snore but is  well rested.  No disrupted activity or sleep.  He has received influenza vaccine and in September had a knee replacement.  No other new concerns.    Current Medications: 1.  Azelastine 1-2 sprays once daily. 2.  Singulair 10mg  once daily. 3.  Nasonex 2 sprays once daily. 4.  Continues HCTZ, Simvastin, Celexa, Klonopin,Naproysn and Androgel.  Drug Allergies: No Known Allergies  Objective:   Filed Vitals:   01/27/15 1112  BP: 154/96                          Repeat 148/84  Pulse: 90  Temp: 98.7 F (37.1 C)  Resp: 18   Physical Exam  Constitutional: He is well-developed, well-nourished, and in no distress.  HENT:  Head: Atraumatic.  Right Ear: Tympanic membrane and ear canal normal.  Left Ear: Tympanic membrane and ear canal normal.  Nose: Mucosal edema present. No rhinorrhea. No epistaxis.  Mouth/Throat: Oropharynx is clear and moist and mucous membranes are normal. No oropharyngeal exudate, posterior oropharyngeal edema or posterior oropharyngeal erythema.  Eyes: Conjunctivae are normal.  Neck: Neck supple.  Cardiovascular: Normal rate, S1 normal and S2 normal.   No murmur heard. Pulmonary/Chest: Effort normal and breath sounds normal. He has no wheezes. He has no rhonchi. He has no rales.  Lymphadenopathy:  He has no cervical adenopathy.  Skin: Skin is warm and intact. No rash noted. No cyanosis. Nails show no clubbing.    Diagnostics: Spirometry:  FVC  3.33--94%,  FEV1  2.80--104%.    Trellis Guirguis M. Ishmael Holter, MD  cc: Consuello Masse, MD

## 2015-01-27 NOTE — Patient Instructions (Signed)
Take Home Sheet   Continue current medication regime.  Nasal Saline wash followed by nasal spray two - three times daily as directed.  Follow up Visit:  6 months or sooner if needed.   Websites that have reliable Patient information: 1. American Academy of Asthma, Allergy, & Immunology: www.aaaai.org 2. Food Allergy Network: www.foodallergy.org 3. Mothers of Asthmatics: www.aanma.org 4. [place]: DiningCalendar.de 5. American College of Allergy, Asthma, & Immunology: https://robertson.info/ or www.acaai.org

## 2015-04-14 ENCOUNTER — Encounter: Payer: Self-pay | Admitting: Allergy and Immunology

## 2015-04-14 ENCOUNTER — Ambulatory Visit (INDEPENDENT_AMBULATORY_CARE_PROVIDER_SITE_OTHER): Payer: PPO | Admitting: Allergy and Immunology

## 2015-04-14 VITALS — BP 144/70 | HR 95 | Temp 98.6°F | Resp 16

## 2015-04-14 DIAGNOSIS — H1010 Acute atopic conjunctivitis, unspecified eye: Secondary | ICD-10-CM | POA: Diagnosis not present

## 2015-04-14 DIAGNOSIS — R05 Cough: Secondary | ICD-10-CM | POA: Diagnosis not present

## 2015-04-14 DIAGNOSIS — R0982 Postnasal drip: Secondary | ICD-10-CM

## 2015-04-14 DIAGNOSIS — J309 Allergic rhinitis, unspecified: Secondary | ICD-10-CM

## 2015-04-14 DIAGNOSIS — R059 Cough, unspecified: Secondary | ICD-10-CM

## 2015-04-14 NOTE — Progress Notes (Addendum)
     FOLLOW UP NOTE  RE: [name] Harper MRN: [id number] DOB: 09-07-1960 [facility] [place]. [address] [place], [place] [address] Date of Office Visit: 04/14/2015  Subjective:  [name] Harper is a 55 y.o. male who presents today for Cough and Nasal Congestion  Assessment:   1. Cough--appears to be significant component of postnasal drip, afebrile, in no respiratory distress with clear lung exam and excellent in office of spirometry.  2. Allergic rhinoconjunctivitis, significant seasonal and perennial hypersensitivities.   3.      History of snoring. Plan:   Patient Instructions  1.  Begin Zyrtec 10mg  each evening. 2.  Begin Rhinocort 2 sprays in the morning each nostril.---(stop Nasonex) 3.  Continue Singulair 10 mg daily. 4.  Continue Azelastine 2 sprays twice daily. 5.  Use saline nasal lavage prior to medicated nasal sprays. 6.  Prednisone 10 mg daily for 4 days. 7.  Follow-up by phone in the next 2 weeks, assess need for further evaluation and in the office in 6 months. 8.  Reconsider initiating allergy injections. 9.  Review with primary MD --- to consider sleep study.  HPI: [name] Harper presents to the office with his wife reporting cough, last seen June 2016.  [name] Harper reports his symptoms began approximately 6 weeks ago particularly in the morning with slight nasal congestion/post nasal drip but no discolored drainage, fever, headache, sore throat, chest congestion/tightness, shortness of breath or wheezing.  Symptoms have improved with consistent medication use, but not 100%. No known sick contacts or recollection of upper respiratory infection himself  No recent travel or other exposures.  [name] Harper has not sought other medical care or used any other over the counter medications.  They have not reconsidered allergy injections.  Denies mealtime associated symptoms, heartburn or any frank GE reflux.  No other new medical issues, questions or concerns.  Denies ED or urgent care  visits, prednisone or antibiotic courses. Reports sleep and activity are normal.  [name] Harper has a current medication list which includes the following prescription(s): azelastine, citalopram, clonazepam, hydrochlorothiazide, mometasone, montelukast, simvastatin, testosterone and methocarbamol.  Drug Allergies: No Known Allergies  Objective:   Filed Vitals:   04/14/15 1059  BP: 144/70  Pulse: 95  Temp: 98.6 F (37 C)  Resp: 16   SpO2 Readings from Last 1 Encounters:  04/14/15 96%   Physical Exam  Constitutional: [name] Harper is well-developed, well-nourished, and in no distress.  Alert interactive communicating easily in full sentences with intermittent throat clearing.  HENT:  Head: Atraumatic.  Right Ear: Tympanic membrane and ear canal normal.  Left Ear: Tympanic membrane and ear canal normal.  Nose: Mucosal edema present. No rhinorrhea. No epistaxis.  Mouth/Throat: Oropharynx is clear and moist and mucous membranes are normal. No oropharyngeal exudate, posterior oropharyngeal edema or posterior oropharyngeal erythema.  Eyes: Conjunctivae are normal.  Neck: Neck supple.  Cardiovascular: Normal rate, S1 normal and S2 normal.   No murmur heard. Pulmonary/Chest: Effort normal and breath sounds normal. [name] Harper has no wheezes. [name] Harper has no rhonchi. [name] Harper has no rales.  Lymphadenopathy:    [name] Harper has no cervical adenopathy.  Skin: Skin is warm and intact. No rash noted. No cyanosis. Nails show no clubbing.   Diagnostics: Spirometry:  FVC  3.95-120%,  FEV1 3.23-120 %.     Austyn Perriello M. Ishmael Holter, MD  cc: Manon Hilding, MD

## 2015-04-14 NOTE — Patient Instructions (Addendum)
   Use Zyrtec 10mg  each evening.  Begin Rhinocort 2 sprays in the morning each nostril.   (stop Nasonex)  Continue Singulair 10 mg daily.  Continue Azelastine 2 sprays twice daily.  Use saline nasal lavage prior to medicated nasal sprays.  Prednisone 10 mg daily for 4 days.  Follow-up by phone in the next 2 weeks and in the office in 6 months.  Reconsider initiating allergy injections.  Review with primary MD --- to consider sleep study.

## 2015-04-20 DIAGNOSIS — Z96651 Presence of right artificial knee joint: Secondary | ICD-10-CM | POA: Diagnosis not present

## 2015-04-20 DIAGNOSIS — Z471 Aftercare following joint replacement surgery: Secondary | ICD-10-CM | POA: Diagnosis not present

## 2015-04-20 DIAGNOSIS — M1711 Unilateral primary osteoarthritis, right knee: Secondary | ICD-10-CM | POA: Diagnosis not present

## 2015-05-05 ENCOUNTER — Telehealth: Payer: Self-pay

## 2015-05-05 NOTE — Telephone Encounter (Signed)
[employer] called to advise the only way for patient to check their cost of allergy injections is for the physicians office to call provider relations at [phone] and give them the codes.  The patient can not contact provider relations.

## 2015-05-12 NOTE — Telephone Encounter (Signed)
Patient called to see if we had spoken with insurance. Once we have answers please call him on eith cell# [phone] or home # [phone].

## 2015-05-18 NOTE — Telephone Encounter (Signed)
Spoke to patient and his wife.  Advised I reached out to his insurance company last week and was unable to obtain any information.  The only thing I was told was they follow Medicare guidelines.  Advised patient I would reach out to our insurance department to see if I could get an estimation for immunotherapy and would call them back.

## 2015-05-20 NOTE — Telephone Encounter (Signed)
Email sent to [employer] (billing).

## 2015-05-22 NOTE — Telephone Encounter (Signed)
Noted  

## 2015-05-22 NOTE — Telephone Encounter (Signed)
Reviewed information received from [facility] with patient.  Advised MCR allows 60.44 per vial If deductible met MCR will pay 80%).  MCR allows 8.10 for one injection and 9.60 for 2 or more (again will pay 80% or possibility of considering injection cost as copayment).  Patient stated he would like to proceed to start injections.  Advised I would forward message to Dr. Ishmael Holter and someone would call him to schedule start date once prescription has been written.  Patient voices understanding.

## 2015-06-01 DIAGNOSIS — K210 Gastro-esophageal reflux disease with esophagitis: Secondary | ICD-10-CM | POA: Diagnosis not present

## 2015-06-01 DIAGNOSIS — E7800 Pure hypercholesterolemia, unspecified: Secondary | ICD-10-CM | POA: Diagnosis not present

## 2015-06-01 DIAGNOSIS — Z13220 Encounter for screening for lipoid disorders: Secondary | ICD-10-CM | POA: Diagnosis not present

## 2015-06-01 DIAGNOSIS — F324 Major depressive disorder, single episode, in partial remission: Secondary | ICD-10-CM | POA: Diagnosis not present

## 2015-06-01 DIAGNOSIS — F411 Generalized anxiety disorder: Secondary | ICD-10-CM | POA: Diagnosis not present

## 2015-06-01 DIAGNOSIS — E119 Type 2 diabetes mellitus without complications: Secondary | ICD-10-CM | POA: Diagnosis not present

## 2015-06-01 DIAGNOSIS — E291 Testicular hypofunction: Secondary | ICD-10-CM | POA: Diagnosis not present

## 2015-06-01 DIAGNOSIS — I10 Essential (primary) hypertension: Secondary | ICD-10-CM | POA: Diagnosis not present

## 2015-06-08 DIAGNOSIS — F411 Generalized anxiety disorder: Secondary | ICD-10-CM | POA: Diagnosis not present

## 2015-06-08 DIAGNOSIS — F3341 Major depressive disorder, recurrent, in partial remission: Secondary | ICD-10-CM | POA: Diagnosis not present

## 2015-06-08 DIAGNOSIS — Z1389 Encounter for screening for other disorder: Secondary | ICD-10-CM | POA: Diagnosis not present

## 2015-06-08 DIAGNOSIS — I10 Essential (primary) hypertension: Secondary | ICD-10-CM | POA: Diagnosis not present

## 2015-06-08 DIAGNOSIS — E119 Type 2 diabetes mellitus without complications: Secondary | ICD-10-CM | POA: Diagnosis not present

## 2015-06-08 DIAGNOSIS — F5221 Male erectile disorder: Secondary | ICD-10-CM | POA: Diagnosis not present

## 2015-06-08 DIAGNOSIS — M25561 Pain in right knee: Secondary | ICD-10-CM | POA: Diagnosis not present

## 2015-06-08 DIAGNOSIS — E291 Testicular hypofunction: Secondary | ICD-10-CM | POA: Diagnosis not present

## 2015-06-10 ENCOUNTER — Other Ambulatory Visit: Payer: Self-pay | Admitting: Allergy and Immunology

## 2015-06-10 DIAGNOSIS — J309 Allergic rhinitis, unspecified: Principal | ICD-10-CM

## 2015-06-10 DIAGNOSIS — H1010 Acute atopic conjunctivitis, unspecified eye: Secondary | ICD-10-CM

## 2015-06-16 ENCOUNTER — Other Ambulatory Visit: Payer: Self-pay | Admitting: Allergy and Immunology

## 2015-06-17 DIAGNOSIS — J3089 Other allergic rhinitis: Secondary | ICD-10-CM | POA: Diagnosis not present

## 2015-06-18 DIAGNOSIS — J301 Allergic rhinitis due to pollen: Secondary | ICD-10-CM | POA: Diagnosis not present

## 2015-06-23 ENCOUNTER — Other Ambulatory Visit: Payer: Self-pay | Admitting: Allergy and Immunology

## 2015-07-21 ENCOUNTER — Ambulatory Visit (INDEPENDENT_AMBULATORY_CARE_PROVIDER_SITE_OTHER): Payer: PPO

## 2015-07-21 ENCOUNTER — Other Ambulatory Visit: Payer: Self-pay

## 2015-07-21 DIAGNOSIS — J309 Allergic rhinitis, unspecified: Secondary | ICD-10-CM

## 2015-07-21 MED ORDER — EPINEPHRINE 0.3 MG/0.3ML IJ SOAJ: INTRAMUSCULAR | Status: DC

## 2015-07-22 NOTE — Progress Notes (Signed)
Immunotherapy   Patient Details  Name: [name] Harper MRN: [id number] Date of Birth: [birth year]  07/21/2015  [name]   Patient came in today to start ITX. Following schedule: A Frequency:  ONCE WEEKLY Epi-Pen:  EpiPen demonstrated and coupon given.  RX sent to pharmacy.   Consent signed and patient instructions given. Emergency Action Plan given and discussed.   Patient waited 30 minutes after injections without any problems.    Janan Ridge 07/21/2015

## 2015-07-28 ENCOUNTER — Encounter: Payer: Self-pay | Admitting: Allergy and Immunology

## 2015-07-28 ENCOUNTER — Ambulatory Visit (INDEPENDENT_AMBULATORY_CARE_PROVIDER_SITE_OTHER): Payer: PPO | Admitting: Allergy and Immunology

## 2015-07-28 VITALS — BP 128/70 | HR 96 | Temp 98.8°F | Resp 16

## 2015-07-28 DIAGNOSIS — J309 Allergic rhinitis, unspecified: Secondary | ICD-10-CM | POA: Diagnosis not present

## 2015-07-28 DIAGNOSIS — H1010 Acute atopic conjunctivitis, unspecified eye: Secondary | ICD-10-CM

## 2015-07-28 DIAGNOSIS — R05 Cough: Secondary | ICD-10-CM

## 2015-07-28 DIAGNOSIS — R059 Cough, unspecified: Secondary | ICD-10-CM

## 2015-07-28 MED ORDER — MOMETASONE FUROATE 50 MCG/ACT NA SUSP: 2.0000 | Freq: Every day | NASAL | Status: DC

## 2015-07-28 MED ORDER — AZELASTINE HCL 0.1 % NA SOLN: NASAL | Status: DC

## 2015-07-28 MED ORDER — MONTELUKAST SODIUM 10 MG PO TABS: 10.0000 mg | ORAL_TABLET | Freq: Every evening | ORAL | Status: DC

## 2015-07-28 NOTE — Progress Notes (Signed)
FOLLOW UP NOTE  RE: [name] Harper MRN: [id number] DOB: 25-Nov-1960 [facility]. [address] [place], [place] [address] Date of Office Visit: 07/28/2015  Subjective:  [name] Harper is a 55 y.o. male who presents today for Allergic Rhinitis   Assessment:   1. Allergic rhinoconjunctivitis, recent immunotherapy start.   2. Cough, secondary to postnasal drip.        Plan:   Meds ordered this encounter  Medications  . mometasone (NASONEX) 50 MCG/ACT nasal spray    Sig: Place 2 sprays into the nose daily.    Dispense:  17 g    Refill:  5  . azelastine (ASTELIN) 0.1 % nasal spray    Sig: Use 1-2 sprays each nostril once to twice daily.    Dispense:  30 mL    Refill:  5  . montelukast (SINGULAIR) 10 MG tablet    Sig: Take 1 tablet (10 mg total) by mouth every evening.    Dispense:  30 tablet    Refill:  3  1.  Continue with allergy injections, as scheduled. 2.  Restart Zyrtec 10 mg each evening, consistently. 3.  Continue Singulair 10 mg each evening. 4.  Continue azelastine and mometasone nasal spray each once daily. 5.  EpiPen/Benadryl prior immunotherapy protocol. 6.  Follow-up in 4 months or sooner if needed--for review/update of recent allergy injection start.  HPI: [name] Harper returns to the office in follow-up of allergic rhinoconjunctivitis, postnasal drip and request for medication refills. Since his last visit in February, he was able to clarify coverage for allergy injections and started last week.  Overall feels good, however, he had stopped all of the daily medications and recently noted postnasal drip triggering cough.  Mild nasal congestion, which has now improved with the restart of nasal sprays over the last 4 days.  He denies fever, sore throat, headache, sinus pressure, chest congestion, shortness of breath or difficulty breathing.  No discolored drainage, or other new medical issues.  He used the sample of Rhinocort given at  last visit  but it was more expensive than generic Nasonex and therefore did not purchase.  Denies ED or urgent care visits, prednisone or antibiotic courses. Reports sleep and activity are normal.  [name] Harper has a current medication list which includes the following prescription(s): azelastine, citalopram, clonazepam, epinephrine, hydrochlorothiazide, mometasone, montelukast, simvastatin and testosterone.  Drug Allergies: No Known Allergies  Objective:   Filed Vitals:   07/28/15 1347  BP: 128/70  Pulse: 96  Temp: 98.8 F (37.1 C)  Resp: 16   SpO2 Readings from Last 1 Encounters:  07/28/15 97%       Physical Exam  Constitutional: He is well-developed, well-nourished, and in no distress.  HENT:  Head: Atraumatic.  Right Ear: Tympanic membrane and ear canal normal.  Left Ear: Tympanic membrane and ear canal normal.  Nose: Mucosal edema present. No rhinorrhea. No epistaxis.  Mouth/Throat: Oropharynx is clear and moist and mucous membranes are normal. No oropharyngeal exudate, posterior oropharyngeal edema or posterior oropharyngeal erythema.  Eyes: Conjunctivae are normal.  Neck: Neck supple.  Cardiovascular: Normal rate, S1 normal and S2 normal.   No murmur heard. Pulmonary/Chest: Effort normal and breath sounds normal. He has no wheezes. He has no rhonchi. He has no rales.  Lymphadenopathy:    He has no cervical adenopathy.  Skin: Skin is warm and intact. No rash noted. No cyanosis. Nails show no clubbing.    Diagnostics: Spirometry:  FVC 3.97--120%, FEV1 2.93--109%.    Roselyn M. Ishmael Holter, MD  cc: Manon Hilding, MD

## 2015-07-28 NOTE — Patient Instructions (Addendum)
   Continue with allergy injections.  Restart Zyrtec 10 mg each evening.  Continue Singulair 10 mg each evening.  Continue azelastine and mometasone nasal spray daily.  EpiPen/Benadryl prior immunotherapy protocol.  Follow-up in 4 months or sooner if needed--for review/update of recent allergy injection start.

## 2015-08-11 ENCOUNTER — Ambulatory Visit (INDEPENDENT_AMBULATORY_CARE_PROVIDER_SITE_OTHER): Payer: PPO

## 2015-08-11 DIAGNOSIS — J309 Allergic rhinitis, unspecified: Secondary | ICD-10-CM | POA: Diagnosis not present

## 2015-08-18 ENCOUNTER — Ambulatory Visit (INDEPENDENT_AMBULATORY_CARE_PROVIDER_SITE_OTHER): Payer: PPO

## 2015-08-18 DIAGNOSIS — J309 Allergic rhinitis, unspecified: Secondary | ICD-10-CM | POA: Diagnosis not present

## 2015-09-01 ENCOUNTER — Ambulatory Visit (INDEPENDENT_AMBULATORY_CARE_PROVIDER_SITE_OTHER): Payer: PPO

## 2015-09-01 DIAGNOSIS — J309 Allergic rhinitis, unspecified: Secondary | ICD-10-CM

## 2015-09-15 ENCOUNTER — Ambulatory Visit (INDEPENDENT_AMBULATORY_CARE_PROVIDER_SITE_OTHER): Payer: PPO

## 2015-09-15 DIAGNOSIS — J309 Allergic rhinitis, unspecified: Secondary | ICD-10-CM

## 2015-09-22 ENCOUNTER — Ambulatory Visit (INDEPENDENT_AMBULATORY_CARE_PROVIDER_SITE_OTHER): Payer: PPO

## 2015-09-22 DIAGNOSIS — J309 Allergic rhinitis, unspecified: Secondary | ICD-10-CM | POA: Diagnosis not present

## 2015-09-29 ENCOUNTER — Ambulatory Visit (INDEPENDENT_AMBULATORY_CARE_PROVIDER_SITE_OTHER): Payer: PPO | Admitting: *Deleted

## 2015-09-29 DIAGNOSIS — J309 Allergic rhinitis, unspecified: Secondary | ICD-10-CM | POA: Diagnosis not present

## 2015-10-06 ENCOUNTER — Ambulatory Visit (INDEPENDENT_AMBULATORY_CARE_PROVIDER_SITE_OTHER): Payer: PPO

## 2015-10-06 DIAGNOSIS — J309 Allergic rhinitis, unspecified: Secondary | ICD-10-CM

## 2015-10-16 DIAGNOSIS — E119 Type 2 diabetes mellitus without complications: Secondary | ICD-10-CM | POA: Diagnosis not present

## 2015-10-16 DIAGNOSIS — I10 Essential (primary) hypertension: Secondary | ICD-10-CM | POA: Diagnosis not present

## 2015-10-16 DIAGNOSIS — E7800 Pure hypercholesterolemia, unspecified: Secondary | ICD-10-CM | POA: Diagnosis not present

## 2015-10-20 ENCOUNTER — Ambulatory Visit (INDEPENDENT_AMBULATORY_CARE_PROVIDER_SITE_OTHER): Payer: PPO

## 2015-10-20 DIAGNOSIS — F411 Generalized anxiety disorder: Secondary | ICD-10-CM | POA: Diagnosis not present

## 2015-10-20 DIAGNOSIS — E119 Type 2 diabetes mellitus without complications: Secondary | ICD-10-CM | POA: Diagnosis not present

## 2015-10-20 DIAGNOSIS — E291 Testicular hypofunction: Secondary | ICD-10-CM | POA: Diagnosis not present

## 2015-10-20 DIAGNOSIS — F3341 Major depressive disorder, recurrent, in partial remission: Secondary | ICD-10-CM | POA: Diagnosis not present

## 2015-10-20 DIAGNOSIS — J309 Allergic rhinitis, unspecified: Secondary | ICD-10-CM

## 2015-10-20 DIAGNOSIS — M25561 Pain in right knee: Secondary | ICD-10-CM | POA: Diagnosis not present

## 2015-10-20 DIAGNOSIS — Z6834 Body mass index (BMI) 34.0-34.9, adult: Secondary | ICD-10-CM | POA: Diagnosis not present

## 2015-10-20 DIAGNOSIS — I10 Essential (primary) hypertension: Secondary | ICD-10-CM | POA: Diagnosis not present

## 2015-10-20 DIAGNOSIS — F5221 Male erectile disorder: Secondary | ICD-10-CM | POA: Diagnosis not present

## 2015-10-23 DIAGNOSIS — Z471 Aftercare following joint replacement surgery: Secondary | ICD-10-CM | POA: Diagnosis not present

## 2015-10-23 DIAGNOSIS — Z96651 Presence of right artificial knee joint: Secondary | ICD-10-CM | POA: Diagnosis not present

## 2015-10-23 DIAGNOSIS — M1711 Unilateral primary osteoarthritis, right knee: Secondary | ICD-10-CM | POA: Diagnosis not present

## 2015-10-27 ENCOUNTER — Ambulatory Visit (INDEPENDENT_AMBULATORY_CARE_PROVIDER_SITE_OTHER): Payer: PPO | Admitting: *Deleted

## 2015-10-27 DIAGNOSIS — J309 Allergic rhinitis, unspecified: Secondary | ICD-10-CM | POA: Diagnosis not present

## 2015-11-02 ENCOUNTER — Other Ambulatory Visit: Payer: Self-pay | Admitting: *Deleted

## 2015-11-02 MED ORDER — MONTELUKAST SODIUM 10 MG PO TABS: 10.0000 mg | ORAL_TABLET | Freq: Every evening | ORAL | 0 refills | Status: DC

## 2015-11-03 ENCOUNTER — Ambulatory Visit (INDEPENDENT_AMBULATORY_CARE_PROVIDER_SITE_OTHER): Payer: PPO | Admitting: *Deleted

## 2015-11-03 DIAGNOSIS — J309 Allergic rhinitis, unspecified: Secondary | ICD-10-CM | POA: Diagnosis not present

## 2015-11-10 ENCOUNTER — Ambulatory Visit (INDEPENDENT_AMBULATORY_CARE_PROVIDER_SITE_OTHER): Payer: PPO | Admitting: *Deleted

## 2015-11-10 DIAGNOSIS — J309 Allergic rhinitis, unspecified: Secondary | ICD-10-CM | POA: Diagnosis not present

## 2015-11-17 ENCOUNTER — Ambulatory Visit (INDEPENDENT_AMBULATORY_CARE_PROVIDER_SITE_OTHER): Payer: PPO | Admitting: *Deleted

## 2015-11-17 DIAGNOSIS — J309 Allergic rhinitis, unspecified: Secondary | ICD-10-CM | POA: Diagnosis not present

## 2015-11-24 ENCOUNTER — Ambulatory Visit (INDEPENDENT_AMBULATORY_CARE_PROVIDER_SITE_OTHER): Payer: PPO | Admitting: *Deleted

## 2015-11-24 DIAGNOSIS — J309 Allergic rhinitis, unspecified: Secondary | ICD-10-CM

## 2015-12-01 ENCOUNTER — Ambulatory Visit: Payer: Self-pay | Admitting: *Deleted

## 2015-12-01 ENCOUNTER — Encounter: Payer: Self-pay | Admitting: Allergy & Immunology

## 2015-12-01 ENCOUNTER — Encounter (INDEPENDENT_AMBULATORY_CARE_PROVIDER_SITE_OTHER): Payer: Self-pay

## 2015-12-01 ENCOUNTER — Ambulatory Visit (INDEPENDENT_AMBULATORY_CARE_PROVIDER_SITE_OTHER): Payer: PPO | Admitting: Allergy & Immunology

## 2015-12-01 VITALS — BP 120/80 | HR 71 | Temp 98.0°F | Resp 16

## 2015-12-01 DIAGNOSIS — H1045 Other chronic allergic conjunctivitis: Secondary | ICD-10-CM | POA: Diagnosis not present

## 2015-12-01 DIAGNOSIS — R0982 Postnasal drip: Secondary | ICD-10-CM | POA: Diagnosis not present

## 2015-12-01 DIAGNOSIS — T781XXD Other adverse food reactions, not elsewhere classified, subsequent encounter: Secondary | ICD-10-CM | POA: Diagnosis not present

## 2015-12-01 DIAGNOSIS — J309 Allergic rhinitis, unspecified: Secondary | ICD-10-CM | POA: Diagnosis not present

## 2015-12-01 DIAGNOSIS — H1010 Acute atopic conjunctivitis, unspecified eye: Secondary | ICD-10-CM

## 2015-12-01 MED ORDER — CETIRIZINE HCL 10 MG PO TABS: 20.0000 mg | ORAL_TABLET | Freq: Every day | ORAL | 2 refills | Status: DC

## 2015-12-01 MED ORDER — TRIAMCINOLONE ACETONIDE 0.1 % EX OINT: TOPICAL_OINTMENT | Freq: Two times a day (BID) | CUTANEOUS | Status: DC

## 2015-12-01 NOTE — Patient Instructions (Addendum)
1. Allergic rhinoconjunctivitis - Continue mometasone nasal spray and azelastine one spray twice daily. - Stop the Singulair and see how you do.  - You can add cetirizine 10-20mg  (1-2 tablets) to help with the rash.  2. Adverse food reaction, subsequent encounter - We will send lab work to look for mushroom allergy. - Use the cetirizine 10-20mg  (1-2 tablets) daily to help with the rash.  - I sent in a prescription for triamcinolone 0.1% ointment.   3. Return in about 6 months (around 05/31/2016).  Please inform us of any Emergency Department visits, hospitalizations, or changes in symptoms. Call us before going to the ED for breathing or allergy symptoms since we might be able to fit you in for a sick visit. Feel free to contact us anytime with any questions, problems, or concerns.  It was a pleasure to meet you today!   Websites that have reliable patient information: 1. American Academy of Asthma, Allergy, and Immunology: www.aaaai.org 2. Food Allergy Research and Education (FARE): foodallergy.org 3. Mothers of Asthmatics: http://www.asthmacommunitynetwork.org 4. American College of Allergy, Asthma, and Immunology: www.acaai.org

## 2015-12-01 NOTE — Progress Notes (Signed)
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 Allergic rhinoconjunctivitis - Continue mometasone nasal spray and azelastine one spray twice daily. - Stop the Singulair and see how you do.  - You can add cetirizine 10-20mg  (1-2 tablets) to help with the rash.  2. Adverse food reaction, subsequent encounter - We will send lab work to look for mushroom allergy. - Patient has an EpiPen since he is on allergy shots, just in case the reaction becomes more severe. - Avoid mushrooms in the meantime. - Patient is tolerating all of the other major food allergens without a problem.  - Use the cetirizine 10-20mg  (1-2 tablets) daily to help with the rash.  - I sent in a prescription for triamcinolone 0.1% ointment.   3. Return in about 6 months (around 05/31/2016).   Subjective:   [name] Harper is a 55 y.o. male presenting today for follow up of  Chief Complaint  Patient presents with  . Allergic Rhinitis   .  [name] Harper has a history of the following: Patient Active Problem List   Diagnosis Date Noted  . 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  [name] Harper was referred by Manon Hilding, MD.     [name] Harper is a 55 y.o. male presenting for a follow up visit. He was last seen in June 2017 at which time he was doing well. He was continued on cetirizine 10mg  daily, Singulair 10mg  each day, and nasal sprays (azelastine and mometasone nasal sprays). He was continued on immunotherapy.   Since the last visit, he has done well. He reports today that he is having a problem with an itchy rash. These have been occurring for three weeks. The rash typically involves both shoulders. He feels that this is related to mushrooms from an omlet (eggs, tomatoes,  green peppers, onions, cheese, mushrooms), which he is now eating around three times per week. This is the only new food.  Prior to this, he was not having an issues with any foods. Interestingly his wife also has the same lesions that started around the same time, but he denies viral symptoms.  He continues on mometasone nasal spray once in the morning and then azelastine twice daily. He remains on Singulair 10mg  as well. This is controlling his symptoms fairly well. He is on allergy injections as well. [place] works with [employer] including [place] does stay active around his home.    Otherwise, there have been no changes to the past medical history, surgical history, family history, or social history.     Review of Systems: a 14-point review of systems is pertinent for what is mentioned in HPI.  Otherwise, all other systems were negative. Constitutional: negative other than that listed in the HPI Eyes: negative other than that listed in the HPI Ears, nose, mouth, throat, and face: negative other than that listed in the HPI Respiratory: negative other than that listed in the HPI Cardiovascular: negative other than that listed in the HPI Gastrointestinal: negative other than that listed in the HPI Genitourinary: negative other than that listed in the HPI Integument: negative other than that listed in the HPI Hematologic: negative other than that listed in the HPI Musculoskeletal: negative other than that  listed in the HPI Neurological: negative other than that listed in the HPI Allergy/Immunologic: negative other than that listed in the HPI    Objective:   Blood pressure 120/80, pulse 71, temperature 98 F (36.7 C), temperature source Oral, resp. rate 16, SpO2 95 %. There is no height or weight on file to calculate BMI.   Physical Exam:  General: Alert, interactive, in no acute distress. Cooperative with the exam.  HEENT: TMs pearly  gray, turbinates minimally edematous without discharge, post-pharynx mildly erythematous. Neck: Supple without thyromegaly. Lungs: Clear to auscultation without wheezing, rhonchi or rales. No increased work of breathing. CV: Normal S1, S2 without murmurs. Capillary refill <2 seconds.  Abdomen: Nondistended, nontender. No masses.  Skin: Warm and dry, without lesions or rashes. 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 Extremities:  No clubbing, cyanosis or edema. Neuro:   Grossly intact. No focal deficits noted.   Diagnostic studies: None    [name], MD [facility] [place]

## 2015-12-02 LAB — ALLERGEN, MUSHROOM, RF212

## 2015-12-08 ENCOUNTER — Ambulatory Visit (INDEPENDENT_AMBULATORY_CARE_PROVIDER_SITE_OTHER): Payer: PPO | Admitting: *Deleted

## 2015-12-08 DIAGNOSIS — J309 Allergic rhinitis, unspecified: Secondary | ICD-10-CM | POA: Diagnosis not present

## 2015-12-09 ENCOUNTER — Telehealth: Payer: Self-pay | Admitting: *Deleted

## 2015-12-09 NOTE — Telephone Encounter (Signed)
-----   Message from Valentina Shaggy, MD sent at 12/04/2015  6:26 AM EDT ----- Could someone call Mr. [name] Harper to let him know that his testing was negative for mushroom allergy? He can safely reintroduce them into his diet. He has an EpiPen at home since he is a shot patient.  [name], MD [facility] [place]

## 2015-12-09 NOTE — Telephone Encounter (Signed)
Mailed letter to patient's home to call office. 

## 2015-12-10 ENCOUNTER — Telehealth: Payer: Self-pay | Admitting: Allergy & Immunology

## 2015-12-10 NOTE — Telephone Encounter (Signed)
They called and said that he is broke out with a rash and took benadryl last night and it has not helped [phone].

## 2015-12-10 NOTE — Telephone Encounter (Signed)
Patient has been submitted for Xolair approval.   Please advise.

## 2015-12-11 MED ORDER — TRIAMCINOLONE ACETONIDE 0.1 % EX OINT: 1.0000 "application " | TOPICAL_OINTMENT | Freq: Two times a day (BID) | CUTANEOUS | 0 refills | Status: DC

## 2015-12-11 NOTE — Telephone Encounter (Signed)
Lm for pt to call us back  

## 2015-12-11 NOTE — Telephone Encounter (Signed)
I believe he is already on two Allegra in the morning and two cetirizine at night. He can take an extra dose or two of cetirizine to help with the itching. We can also send in a triamcinolone ointment to help as well. I will send it in. Could someone call the patient to let him know?  [name], MD [facility] [place]

## 2015-12-14 DIAGNOSIS — Z6835 Body mass index (BMI) 35.0-35.9, adult: Secondary | ICD-10-CM | POA: Diagnosis not present

## 2015-12-14 DIAGNOSIS — R21 Rash and other nonspecific skin eruption: Secondary | ICD-10-CM | POA: Diagnosis not present

## 2015-12-15 ENCOUNTER — Ambulatory Visit (INDEPENDENT_AMBULATORY_CARE_PROVIDER_SITE_OTHER): Payer: PPO

## 2015-12-15 DIAGNOSIS — J309 Allergic rhinitis, unspecified: Secondary | ICD-10-CM | POA: Diagnosis not present

## 2015-12-16 NOTE — Telephone Encounter (Signed)
Spoke to patient and he went to his PCP.

## 2015-12-29 ENCOUNTER — Ambulatory Visit (INDEPENDENT_AMBULATORY_CARE_PROVIDER_SITE_OTHER): Payer: PPO | Admitting: *Deleted

## 2015-12-29 DIAGNOSIS — J309 Allergic rhinitis, unspecified: Secondary | ICD-10-CM | POA: Diagnosis not present

## 2016-01-05 ENCOUNTER — Ambulatory Visit (INDEPENDENT_AMBULATORY_CARE_PROVIDER_SITE_OTHER): Payer: PPO | Admitting: *Deleted

## 2016-01-05 DIAGNOSIS — J309 Allergic rhinitis, unspecified: Secondary | ICD-10-CM

## 2016-01-19 ENCOUNTER — Ambulatory Visit (INDEPENDENT_AMBULATORY_CARE_PROVIDER_SITE_OTHER): Payer: PPO | Admitting: *Deleted

## 2016-01-19 DIAGNOSIS — J309 Allergic rhinitis, unspecified: Secondary | ICD-10-CM

## 2016-01-26 ENCOUNTER — Ambulatory Visit (INDEPENDENT_AMBULATORY_CARE_PROVIDER_SITE_OTHER): Payer: PPO | Admitting: *Deleted

## 2016-01-26 DIAGNOSIS — J309 Allergic rhinitis, unspecified: Secondary | ICD-10-CM | POA: Diagnosis not present

## 2016-01-28 ENCOUNTER — Other Ambulatory Visit: Payer: Self-pay

## 2016-01-28 MED ORDER — MOMETASONE FUROATE 50 MCG/ACT NA SUSP: 2.0000 | Freq: Every day | NASAL | 5 refills | Status: DC

## 2016-01-28 MED ORDER — AZELASTINE HCL 0.1 % NA SOLN: NASAL | 5 refills | Status: DC

## 2016-02-01 DIAGNOSIS — E119 Type 2 diabetes mellitus without complications: Secondary | ICD-10-CM | POA: Diagnosis not present

## 2016-02-01 DIAGNOSIS — E291 Testicular hypofunction: Secondary | ICD-10-CM | POA: Diagnosis not present

## 2016-02-01 DIAGNOSIS — E7800 Pure hypercholesterolemia, unspecified: Secondary | ICD-10-CM | POA: Diagnosis not present

## 2016-02-01 DIAGNOSIS — I10 Essential (primary) hypertension: Secondary | ICD-10-CM | POA: Diagnosis not present

## 2016-02-01 DIAGNOSIS — K210 Gastro-esophageal reflux disease with esophagitis: Secondary | ICD-10-CM | POA: Diagnosis not present

## 2016-02-03 DIAGNOSIS — F411 Generalized anxiety disorder: Secondary | ICD-10-CM | POA: Diagnosis not present

## 2016-02-03 DIAGNOSIS — R110 Nausea: Secondary | ICD-10-CM | POA: Diagnosis not present

## 2016-02-03 DIAGNOSIS — Z6835 Body mass index (BMI) 35.0-35.9, adult: Secondary | ICD-10-CM | POA: Diagnosis not present

## 2016-02-03 DIAGNOSIS — E119 Type 2 diabetes mellitus without complications: Secondary | ICD-10-CM | POA: Diagnosis not present

## 2016-02-03 DIAGNOSIS — I10 Essential (primary) hypertension: Secondary | ICD-10-CM | POA: Diagnosis not present

## 2016-02-03 DIAGNOSIS — E291 Testicular hypofunction: Secondary | ICD-10-CM | POA: Diagnosis not present

## 2016-02-03 DIAGNOSIS — M25561 Pain in right knee: Secondary | ICD-10-CM | POA: Diagnosis not present

## 2016-02-03 DIAGNOSIS — F5221 Male erectile disorder: Secondary | ICD-10-CM | POA: Diagnosis not present

## 2016-02-09 ENCOUNTER — Ambulatory Visit (INDEPENDENT_AMBULATORY_CARE_PROVIDER_SITE_OTHER): Payer: PPO | Admitting: *Deleted

## 2016-02-09 DIAGNOSIS — J309 Allergic rhinitis, unspecified: Secondary | ICD-10-CM | POA: Diagnosis not present

## 2016-02-23 ENCOUNTER — Ambulatory Visit (INDEPENDENT_AMBULATORY_CARE_PROVIDER_SITE_OTHER): Payer: PPO | Admitting: *Deleted

## 2016-02-23 DIAGNOSIS — J309 Allergic rhinitis, unspecified: Secondary | ICD-10-CM | POA: Diagnosis not present

## 2016-03-08 ENCOUNTER — Ambulatory Visit (INDEPENDENT_AMBULATORY_CARE_PROVIDER_SITE_OTHER): Payer: PPO | Admitting: *Deleted

## 2016-03-08 DIAGNOSIS — J309 Allergic rhinitis, unspecified: Secondary | ICD-10-CM

## 2016-03-15 ENCOUNTER — Ambulatory Visit (INDEPENDENT_AMBULATORY_CARE_PROVIDER_SITE_OTHER): Payer: PPO | Admitting: *Deleted

## 2016-03-15 DIAGNOSIS — J309 Allergic rhinitis, unspecified: Secondary | ICD-10-CM

## 2016-03-16 DIAGNOSIS — G44219 Episodic tension-type headache, not intractable: Secondary | ICD-10-CM | POA: Diagnosis not present

## 2016-03-16 DIAGNOSIS — H40033 Anatomical narrow angle, bilateral: Secondary | ICD-10-CM | POA: Diagnosis not present

## 2016-03-22 ENCOUNTER — Ambulatory Visit (INDEPENDENT_AMBULATORY_CARE_PROVIDER_SITE_OTHER): Payer: PPO | Admitting: *Deleted

## 2016-03-22 DIAGNOSIS — J309 Allergic rhinitis, unspecified: Secondary | ICD-10-CM | POA: Diagnosis not present

## 2016-03-25 DIAGNOSIS — E7800 Pure hypercholesterolemia, unspecified: Secondary | ICD-10-CM | POA: Diagnosis not present

## 2016-03-25 DIAGNOSIS — E291 Testicular hypofunction: Secondary | ICD-10-CM | POA: Diagnosis not present

## 2016-03-25 DIAGNOSIS — E221 Hyperprolactinemia: Secondary | ICD-10-CM | POA: Diagnosis not present

## 2016-03-25 DIAGNOSIS — R7301 Impaired fasting glucose: Secondary | ICD-10-CM | POA: Diagnosis not present

## 2016-03-29 ENCOUNTER — Ambulatory Visit (INDEPENDENT_AMBULATORY_CARE_PROVIDER_SITE_OTHER): Payer: PPO | Admitting: *Deleted

## 2016-03-29 DIAGNOSIS — J309 Allergic rhinitis, unspecified: Secondary | ICD-10-CM | POA: Diagnosis not present

## 2016-03-31 DIAGNOSIS — R7301 Impaired fasting glucose: Secondary | ICD-10-CM | POA: Diagnosis not present

## 2016-03-31 DIAGNOSIS — E291 Testicular hypofunction: Secondary | ICD-10-CM | POA: Diagnosis not present

## 2016-03-31 DIAGNOSIS — E221 Hyperprolactinemia: Secondary | ICD-10-CM | POA: Diagnosis not present

## 2016-03-31 DIAGNOSIS — E7800 Pure hypercholesterolemia, unspecified: Secondary | ICD-10-CM | POA: Diagnosis not present

## 2016-04-05 ENCOUNTER — Ambulatory Visit (INDEPENDENT_AMBULATORY_CARE_PROVIDER_SITE_OTHER): Payer: PPO | Admitting: *Deleted

## 2016-04-05 DIAGNOSIS — J309 Allergic rhinitis, unspecified: Secondary | ICD-10-CM

## 2016-04-26 ENCOUNTER — Ambulatory Visit (INDEPENDENT_AMBULATORY_CARE_PROVIDER_SITE_OTHER): Payer: PPO | Admitting: *Deleted

## 2016-04-26 DIAGNOSIS — J309 Allergic rhinitis, unspecified: Secondary | ICD-10-CM

## 2016-05-03 ENCOUNTER — Ambulatory Visit (INDEPENDENT_AMBULATORY_CARE_PROVIDER_SITE_OTHER): Payer: PPO | Admitting: *Deleted

## 2016-05-03 DIAGNOSIS — J309 Allergic rhinitis, unspecified: Secondary | ICD-10-CM

## 2016-05-17 ENCOUNTER — Ambulatory Visit (INDEPENDENT_AMBULATORY_CARE_PROVIDER_SITE_OTHER): Payer: PPO | Admitting: *Deleted

## 2016-05-17 DIAGNOSIS — J309 Allergic rhinitis, unspecified: Secondary | ICD-10-CM

## 2016-05-24 ENCOUNTER — Ambulatory Visit (INDEPENDENT_AMBULATORY_CARE_PROVIDER_SITE_OTHER): Payer: PPO | Admitting: *Deleted

## 2016-05-24 DIAGNOSIS — J309 Allergic rhinitis, unspecified: Secondary | ICD-10-CM

## 2016-05-25 DIAGNOSIS — J301 Allergic rhinitis due to pollen: Secondary | ICD-10-CM | POA: Diagnosis not present

## 2016-05-26 DIAGNOSIS — J3089 Other allergic rhinitis: Secondary | ICD-10-CM | POA: Diagnosis not present

## 2016-05-31 ENCOUNTER — Ambulatory Visit (INDEPENDENT_AMBULATORY_CARE_PROVIDER_SITE_OTHER): Payer: PPO | Admitting: Allergy & Immunology

## 2016-05-31 ENCOUNTER — Encounter: Payer: Self-pay | Admitting: Allergy & Immunology

## 2016-05-31 VITALS — BP 136/76 | HR 103 | Temp 98.3°F | Resp 18 | Ht 60.83 in | Wt 192.0 lb

## 2016-05-31 DIAGNOSIS — J3089 Other allergic rhinitis: Secondary | ICD-10-CM

## 2016-05-31 DIAGNOSIS — J309 Allergic rhinitis, unspecified: Secondary | ICD-10-CM

## 2016-05-31 NOTE — Patient Instructions (Addendum)
1. Allergic rhinoconjunctivitis - Continue mometasone nasal spray one spray per nostril daily. - Continue with azelastine one spray twice daily. - Add cetirizine 10-20mg  (1-2 tablets) to help with any breakthrough symptoms. - Continue with immunotherapy at same schedule.   2. Return in about 6 months (around 11/30/2016).  Please inform us of any Emergency Department visits, hospitalizations, or changes in symptoms. Call us before going to the ED for breathing or allergy symptoms since we might be able to fit you in for a sick visit. Feel free to contact us anytime with any questions, problems, or concerns.  It was a pleasure to see you again today! Happy spring!   Websites that have reliable patient information: 1. American Academy of Asthma, Allergy, and Immunology: www.aaaai.org 2. Food Allergy Research and Education (FARE): foodallergy.org 3. Mothers of Asthmatics: http://www.asthmacommunitynetwork.org 4. American College of Allergy, Asthma, and Immunology: www.acaai.org

## 2016-05-31 NOTE — Progress Notes (Signed)
FOLLOW UP  Date of Service/Encounter:  05/31/16   Assessment:   Perennial allergic rhinitis (grasses, weeds, trees, molds, DM, dog, cat, cockroach)   Plan/Recommendations:   1. Allergic rhinoconjunctivitis - Continue mometasone nasal spray one spray per nostril daily. - Continue with azelastine one spray twice daily. - Add cetirizine 10-20mg  (1-2 tablets) to help with any breakthrough symptoms. - Continue with immunotherapy at same schedule.  - Refills provided.   2. Return in about 6 months (around 11/30/2016).  Subjective:   [name] Harper is a 56 y.o. male presenting today for follow up of  Chief Complaint  Patient presents with  . Allergies    [name] Harper has a history of the following: Patient Active Problem List   Diagnosis Date Noted  . 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  [name] Harper was referred by Manon Hilding, MD.     [name] Harper is a 56 y.o. male presenting for a follow up visit. He was last seen in October 2017. At that time, we continued him on Nasonex and azelastine one spray per nostril twice daily. We stopped the Singulair and I recommended adding cetirizine 10-20mg  daily to help with any breakthrough symptoms. We kept him on allergen immunotherapy. He was concerned with a mushroom allergy at the last visit, therefore we sent a mushroom IgE which was negative.   Since the last visit, he has done well. He did stop the Singulair and did not feel any worse. He does endorse some nausea in the morning. This occurs every morning and has been occurring less in the last six months. Symptoms seem to get better when he is better about using his azelastine. Allergy shots are going well without reactions. His follow up is not great, therefore he has take quite some time to get up to maintenance on both vials. His rash from the last visit has improved. He does not like mushrooms at all so he does not eat  them.   He is s/p knee replacement surgery on the right knee. This seems to be healing well. He spends a lot of time taking care of his wife, who has multiple complications from diabetes. Otherwise, there have been no changes to his past medical history, surgical history, family history, or social history.      Review of Systems: a 14-point review of systems is pertinent for what is mentioned in HPI.  Otherwise, all other systems were negative. Constitutional: negative other than that listed in the HPI Eyes: negative other than that listed in the HPI Ears, nose, mouth, throat, and face: negative other than that listed in the HPI Respiratory: negative other than that listed in the HPI Cardiovascular: negative other than that listed in the HPI Gastrointestinal: negative other than that listed in the HPI Genitourinary: negative other than that listed in the HPI Integument: negative other than that listed in the HPI Hematologic: negative other than that listed in the HPI Musculoskeletal: negative other than that listed in the HPI Neurological: negative other than that listed in the HPI Allergy/Immunologic: negative other than that listed in the HPI    Objective:   Blood pressure 136/76, pulse (!) 103, temperature 98.3 F (36.8 C), temperature source Oral, resp. rate 18, height 5' 0.83" (1.545 m), weight 192 lb (87.1 kg), SpO2 95 %. Body mass index is 36.48 kg/m.   Physical Exam:  General: Alert, interactive, in no acute distress. Cooperative with the  exam. Pleasant.  Eyes: No conjunctival injection present on the right, No conjunctival injection present on the left, PERRL bilaterally, No discharge on the right, No discharge on the left and No Horner-Trantas dots present Ears: Right TM pearly gray with normal light reflex, Left TM pearly gray with normal light reflex, Right TM intact without perforation and Left TM intact without perforation.  Nose/Throat: External nose within normal  limits and septum midline, turbinates edematous and pale with clear discharge, post-pharynx erythematous with cobblestoning in the posterior oropharynx. Tonsils 2+ without exudates Neck: Supple without thyromegaly. Lungs: Clear to auscultation without wheezing, rhonchi or rales. No increased work of breathing. CV: Normal S1/S2, no murmurs. Capillary refill <2 seconds.  Skin: Warm and dry, without lesions or rashes. Multiple tattoos.  Neuro:   Grossly intact. No focal deficits appreciated. Responsive to questions.   Diagnostic studies: none    [name], MD [facility] [place] [facility] [place]

## 2016-06-06 DIAGNOSIS — I10 Essential (primary) hypertension: Secondary | ICD-10-CM | POA: Diagnosis not present

## 2016-06-06 DIAGNOSIS — E7800 Pure hypercholesterolemia, unspecified: Secondary | ICD-10-CM | POA: Diagnosis not present

## 2016-06-06 DIAGNOSIS — E119 Type 2 diabetes mellitus without complications: Secondary | ICD-10-CM | POA: Diagnosis not present

## 2016-06-07 ENCOUNTER — Ambulatory Visit (INDEPENDENT_AMBULATORY_CARE_PROVIDER_SITE_OTHER): Payer: PPO | Admitting: *Deleted

## 2016-06-07 DIAGNOSIS — J309 Allergic rhinitis, unspecified: Secondary | ICD-10-CM | POA: Diagnosis not present

## 2016-06-08 DIAGNOSIS — I10 Essential (primary) hypertension: Secondary | ICD-10-CM | POA: Diagnosis not present

## 2016-06-08 DIAGNOSIS — F5221 Male erectile disorder: Secondary | ICD-10-CM | POA: Diagnosis not present

## 2016-06-08 DIAGNOSIS — E291 Testicular hypofunction: Secondary | ICD-10-CM | POA: Diagnosis not present

## 2016-06-08 DIAGNOSIS — R110 Nausea: Secondary | ICD-10-CM | POA: Diagnosis not present

## 2016-06-08 DIAGNOSIS — E119 Type 2 diabetes mellitus without complications: Secondary | ICD-10-CM | POA: Diagnosis not present

## 2016-06-08 DIAGNOSIS — F3341 Major depressive disorder, recurrent, in partial remission: Secondary | ICD-10-CM | POA: Diagnosis not present

## 2016-06-08 DIAGNOSIS — M25561 Pain in right knee: Secondary | ICD-10-CM | POA: Diagnosis not present

## 2016-06-08 DIAGNOSIS — F411 Generalized anxiety disorder: Secondary | ICD-10-CM | POA: Diagnosis not present

## 2016-06-21 ENCOUNTER — Ambulatory Visit (INDEPENDENT_AMBULATORY_CARE_PROVIDER_SITE_OTHER): Payer: PPO | Admitting: *Deleted

## 2016-06-21 DIAGNOSIS — J309 Allergic rhinitis, unspecified: Secondary | ICD-10-CM

## 2016-06-28 ENCOUNTER — Ambulatory Visit (INDEPENDENT_AMBULATORY_CARE_PROVIDER_SITE_OTHER): Payer: PPO | Admitting: *Deleted

## 2016-06-28 DIAGNOSIS — J309 Allergic rhinitis, unspecified: Secondary | ICD-10-CM

## 2016-06-29 DIAGNOSIS — N400 Enlarged prostate without lower urinary tract symptoms: Secondary | ICD-10-CM | POA: Diagnosis not present

## 2016-06-29 DIAGNOSIS — N5201 Erectile dysfunction due to arterial insufficiency: Secondary | ICD-10-CM | POA: Diagnosis not present

## 2016-07-05 ENCOUNTER — Ambulatory Visit (INDEPENDENT_AMBULATORY_CARE_PROVIDER_SITE_OTHER): Payer: PPO | Admitting: *Deleted

## 2016-07-05 DIAGNOSIS — J309 Allergic rhinitis, unspecified: Secondary | ICD-10-CM

## 2016-07-12 ENCOUNTER — Ambulatory Visit (INDEPENDENT_AMBULATORY_CARE_PROVIDER_SITE_OTHER): Payer: PPO | Admitting: *Deleted

## 2016-07-12 DIAGNOSIS — J309 Allergic rhinitis, unspecified: Secondary | ICD-10-CM | POA: Diagnosis not present

## 2016-07-19 ENCOUNTER — Other Ambulatory Visit: Payer: Self-pay | Admitting: Allergy & Immunology

## 2016-07-26 ENCOUNTER — Ambulatory Visit (INDEPENDENT_AMBULATORY_CARE_PROVIDER_SITE_OTHER): Payer: PPO | Admitting: *Deleted

## 2016-07-26 DIAGNOSIS — J309 Allergic rhinitis, unspecified: Secondary | ICD-10-CM | POA: Diagnosis not present

## 2016-08-02 ENCOUNTER — Ambulatory Visit (INDEPENDENT_AMBULATORY_CARE_PROVIDER_SITE_OTHER): Payer: PPO | Admitting: *Deleted

## 2016-08-02 DIAGNOSIS — J309 Allergic rhinitis, unspecified: Secondary | ICD-10-CM | POA: Diagnosis not present

## 2016-08-16 ENCOUNTER — Ambulatory Visit (INDEPENDENT_AMBULATORY_CARE_PROVIDER_SITE_OTHER): Payer: PPO | Admitting: *Deleted

## 2016-08-16 DIAGNOSIS — J309 Allergic rhinitis, unspecified: Secondary | ICD-10-CM | POA: Diagnosis not present

## 2016-08-23 ENCOUNTER — Ambulatory Visit (INDEPENDENT_AMBULATORY_CARE_PROVIDER_SITE_OTHER): Payer: PPO | Admitting: *Deleted

## 2016-08-23 DIAGNOSIS — J309 Allergic rhinitis, unspecified: Secondary | ICD-10-CM | POA: Diagnosis not present

## 2016-08-30 ENCOUNTER — Ambulatory Visit (INDEPENDENT_AMBULATORY_CARE_PROVIDER_SITE_OTHER): Payer: PPO | Admitting: *Deleted

## 2016-08-30 DIAGNOSIS — J309 Allergic rhinitis, unspecified: Secondary | ICD-10-CM | POA: Diagnosis not present

## 2016-09-20 ENCOUNTER — Ambulatory Visit (INDEPENDENT_AMBULATORY_CARE_PROVIDER_SITE_OTHER): Payer: PPO

## 2016-09-20 DIAGNOSIS — J309 Allergic rhinitis, unspecified: Secondary | ICD-10-CM

## 2016-09-27 DIAGNOSIS — M79672 Pain in left foot: Secondary | ICD-10-CM | POA: Diagnosis not present

## 2016-09-27 DIAGNOSIS — M216X2 Other acquired deformities of left foot: Secondary | ICD-10-CM | POA: Diagnosis not present

## 2016-10-04 ENCOUNTER — Ambulatory Visit (INDEPENDENT_AMBULATORY_CARE_PROVIDER_SITE_OTHER): Payer: PPO | Admitting: *Deleted

## 2016-10-04 DIAGNOSIS — J309 Allergic rhinitis, unspecified: Secondary | ICD-10-CM

## 2016-10-10 DIAGNOSIS — F5221 Male erectile disorder: Secondary | ICD-10-CM | POA: Diagnosis not present

## 2016-10-10 DIAGNOSIS — E119 Type 2 diabetes mellitus without complications: Secondary | ICD-10-CM | POA: Diagnosis not present

## 2016-10-10 DIAGNOSIS — E7800 Pure hypercholesterolemia, unspecified: Secondary | ICD-10-CM | POA: Diagnosis not present

## 2016-10-10 DIAGNOSIS — I10 Essential (primary) hypertension: Secondary | ICD-10-CM | POA: Diagnosis not present

## 2016-10-10 DIAGNOSIS — K210 Gastro-esophageal reflux disease with esophagitis: Secondary | ICD-10-CM | POA: Diagnosis not present

## 2016-10-10 DIAGNOSIS — F3341 Major depressive disorder, recurrent, in partial remission: Secondary | ICD-10-CM | POA: Diagnosis not present

## 2016-10-11 ENCOUNTER — Ambulatory Visit (INDEPENDENT_AMBULATORY_CARE_PROVIDER_SITE_OTHER): Payer: PPO | Admitting: *Deleted

## 2016-10-11 DIAGNOSIS — J309 Allergic rhinitis, unspecified: Secondary | ICD-10-CM

## 2016-10-12 DIAGNOSIS — E291 Testicular hypofunction: Secondary | ICD-10-CM | POA: Diagnosis not present

## 2016-10-12 DIAGNOSIS — E119 Type 2 diabetes mellitus without complications: Secondary | ICD-10-CM | POA: Diagnosis not present

## 2016-10-12 DIAGNOSIS — M25561 Pain in right knee: Secondary | ICD-10-CM | POA: Diagnosis not present

## 2016-10-12 DIAGNOSIS — F5221 Male erectile disorder: Secondary | ICD-10-CM | POA: Diagnosis not present

## 2016-10-12 DIAGNOSIS — Z1389 Encounter for screening for other disorder: Secondary | ICD-10-CM | POA: Diagnosis not present

## 2016-10-12 DIAGNOSIS — F411 Generalized anxiety disorder: Secondary | ICD-10-CM | POA: Diagnosis not present

## 2016-10-12 DIAGNOSIS — R110 Nausea: Secondary | ICD-10-CM | POA: Diagnosis not present

## 2016-10-12 DIAGNOSIS — I10 Essential (primary) hypertension: Secondary | ICD-10-CM | POA: Diagnosis not present

## 2016-10-18 ENCOUNTER — Ambulatory Visit (INDEPENDENT_AMBULATORY_CARE_PROVIDER_SITE_OTHER): Payer: PPO | Admitting: *Deleted

## 2016-10-18 DIAGNOSIS — M216X2 Other acquired deformities of left foot: Secondary | ICD-10-CM | POA: Diagnosis not present

## 2016-10-18 DIAGNOSIS — J309 Allergic rhinitis, unspecified: Secondary | ICD-10-CM

## 2016-11-03 DIAGNOSIS — E663 Overweight: Secondary | ICD-10-CM | POA: Diagnosis not present

## 2016-11-03 DIAGNOSIS — M7752 Other enthesopathy of left foot: Secondary | ICD-10-CM | POA: Diagnosis not present

## 2016-11-03 DIAGNOSIS — Z79899 Other long term (current) drug therapy: Secondary | ICD-10-CM | POA: Diagnosis not present

## 2016-11-03 DIAGNOSIS — L84 Corns and callosities: Secondary | ICD-10-CM | POA: Diagnosis not present

## 2016-11-03 DIAGNOSIS — F329 Major depressive disorder, single episode, unspecified: Secondary | ICD-10-CM | POA: Diagnosis not present

## 2016-11-03 DIAGNOSIS — M21622 Bunionette of left foot: Secondary | ICD-10-CM | POA: Diagnosis not present

## 2016-11-03 DIAGNOSIS — I10 Essential (primary) hypertension: Secondary | ICD-10-CM | POA: Diagnosis not present

## 2016-11-03 DIAGNOSIS — M1990 Unspecified osteoarthritis, unspecified site: Secondary | ICD-10-CM | POA: Diagnosis not present

## 2016-11-03 DIAGNOSIS — E119 Type 2 diabetes mellitus without complications: Secondary | ICD-10-CM | POA: Diagnosis not present

## 2016-11-03 DIAGNOSIS — F419 Anxiety disorder, unspecified: Secondary | ICD-10-CM | POA: Diagnosis not present

## 2016-11-03 DIAGNOSIS — Z6833 Body mass index (BMI) 33.0-33.9, adult: Secondary | ICD-10-CM | POA: Diagnosis not present

## 2016-11-15 ENCOUNTER — Ambulatory Visit (INDEPENDENT_AMBULATORY_CARE_PROVIDER_SITE_OTHER): Payer: PPO | Admitting: *Deleted

## 2016-11-15 DIAGNOSIS — J309 Allergic rhinitis, unspecified: Secondary | ICD-10-CM

## 2016-11-15 DIAGNOSIS — M216X2 Other acquired deformities of left foot: Secondary | ICD-10-CM | POA: Diagnosis not present

## 2016-11-22 ENCOUNTER — Ambulatory Visit (INDEPENDENT_AMBULATORY_CARE_PROVIDER_SITE_OTHER): Payer: PPO

## 2016-11-22 DIAGNOSIS — J309 Allergic rhinitis, unspecified: Secondary | ICD-10-CM

## 2016-11-29 ENCOUNTER — Encounter: Payer: Self-pay | Admitting: Allergy & Immunology

## 2016-11-29 ENCOUNTER — Ambulatory Visit (INDEPENDENT_AMBULATORY_CARE_PROVIDER_SITE_OTHER): Payer: PPO | Admitting: Allergy & Immunology

## 2016-11-29 VITALS — BP 142/90 | HR 76 | Resp 20

## 2016-11-29 DIAGNOSIS — J309 Allergic rhinitis, unspecified: Secondary | ICD-10-CM

## 2016-11-29 DIAGNOSIS — R0982 Postnasal drip: Secondary | ICD-10-CM | POA: Insufficient documentation

## 2016-11-29 DIAGNOSIS — J3089 Other allergic rhinitis: Secondary | ICD-10-CM | POA: Diagnosis not present

## 2016-11-29 DIAGNOSIS — K219 Gastro-esophageal reflux disease without esophagitis: Secondary | ICD-10-CM | POA: Diagnosis not present

## 2016-11-29 MED ORDER — OMEPRAZOLE 20 MG PO CPDR: 20.0000 mg | DELAYED_RELEASE_CAPSULE | Freq: Every day | ORAL | 5 refills | Status: DC

## 2016-11-29 MED ORDER — MOMETASONE FUROATE 50 MCG/ACT NA SUSP: NASAL | 5 refills | Status: DC

## 2016-11-29 MED ORDER — AZELASTINE HCL 0.1 % NA SOLN: NASAL | 5 refills | Status: DC

## 2016-11-29 NOTE — Patient Instructions (Addendum)
1. Allergic rhinoconjunctivitis - Continue mometasone nasal spray one spray per nostril daily. - Continue with azelastine one spray twice daily. - Stop the cetirizine and try both the Allegra and then the Xyzal 5mg  to see which one works better.  - Call us to let us know which one helps more.  - Continue with immunotherapy at same schedule.   2. GERD   - We will get outside records to see what kind of operation you had in 2007/2008. - In the meantime, start omeprazole 20mg  nightly. - Hopefully this will help with your morning symptoms. - Give [place] an update after 2-4 weeks.   3. Return in about 3 months (around 03/01/2017).   Please inform us of any Emergency Department visits, hospitalizations, or changes in symptoms. Call us before going to the ED for breathing or allergy symptoms since we might be able to fit you in for a sick visit. Feel free to contact us anytime with any questions, problems, or concerns.  It was a pleasure to see you again today! Enjoy the upcoming fall season!  Websites that have reliable patient information: 1. American Academy of Asthma, Allergy, and Immunology: www.aaaai.org 2. Food Allergy Research and Education (FARE): foodallergy.org 3. Mothers of Asthmatics: http://www.asthmacommunitynetwork.org 4. American College of Allergy, Asthma, and Immunology: www.acaai.org   Election Day is coming up on Tuesday, November 6th! Make your voice heard! Register to vote at [email or web address]!     The last day to register is October 12th!   You can check the status of your registration by looking it up at [email or web address].  [place]: [email or web address]  [facility] of Elections:  [email or web address]

## 2016-11-29 NOTE — Progress Notes (Signed)
FOLLOW UP  Date of Service/Encounter:  11/29/16   Assessment:   Perennial allergic rhinitis  Post-nasal drip  Gastroesophageal reflux disease  Plan/Recommendations:   1. Allergic rhinoconjunctivitis - Continue mometasone nasal spray one spray per nostril daily. - Continue with azelastine one spray twice daily. - Stop the cetirizine and try both the Allegra and then the Xyzal 5mg  to see which one works better.  - Call us to let us know which one helps more.  - Continue with immunotherapy at same schedule.   2. GERD   - We will get outside records to see what kind of operation you had in 2007/2008. - In the meantime, start omeprazole 20mg  nightly. - Hopefully this will help with your morning symptoms. - Give [place] an update after 2-4 weeks.   3. Return in about 3 months (around 03/01/2017).   Subjective:   [name] Harper is a 56 y.o. male presenting today for follow up of  Chief Complaint  Patient presents with  . Allergic Rhinitis     f/u itx helping; every morning cough with phlegm     [name] Harper has a history of the following: Patient Active Problem List   Diagnosis Date Noted  . Post-nasal drip 11/29/2016  . Perennial allergic rhinitis 11/29/2016  . Gastroesophageal reflux disease 11/29/2016  . 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  [name] Harper Primary Care Provider is Sasser, Silvestre Moment, MD.     [name] Harper is a 56 y.o. male presenting for a follow up visit. He was last seen in April 2018. He has a history of allergic rhinitis with sensitizations to grasses, weeds, trees, molds, DM, dog, cat, and cockroach. He is on immunotherapy and was doing well at the last visit. He was endorsing some postnasal drip, therefore I recommended the addition of cetirizine 10-20mg  daily to help with this. We continued him on Nasonex one spray per nostril daily as well as azelastine 1-2 sprays per nostril daily.   Since  the last visit, he has continued to have problems with the postnasal drip and mucous production. He stopped taking the cetirizine because it was providing no benefit whatsoever. He reports that he has marked mucous production in the mornings when he first wakes up. He then will cough several times and after an hour or so, his symptoms resolve completely. He is otherwise fine during the rest of the day. He is not on a reflux medication at this time.   He does have a history of what sounds like a gastric bypass surgery. This took place in 2007 or 2008 by a Psychologist, sport and exercise in [place]. However, we do not have records of this at all. He may have been on a GERD medication at some point. He has not been on GERD medications in quite some time. He tells me today that he does not have reflux at all and denies chest pain with eating. However, he eventually comes around to the possibility that this might be contributing.   He continues to recover well from his knee replacement surgery earlier this year. His wife continues to suffer from complications of diabetes mellitus. Otherwise, there have been no changes to his past medical history, surgical history, family history, or social history.    Review of Systems: a 14-point review of systems is pertinent for what is mentioned in HPI.  Otherwise, all other systems were negative. Constitutional: negative other than that listed in the  HPI Eyes: negative other than that listed in the HPI Ears, nose, mouth, throat, and face: negative other than that listed in the HPI Respiratory: negative other than that listed in the HPI Cardiovascular: negative other than that listed in the HPI Gastrointestinal: negative other than that listed in the HPI Genitourinary: negative other than that listed in the HPI Integument: negative other than that listed in the HPI Hematologic: negative other than that listed in the HPI Musculoskeletal: negative other than that listed in the  HPI Neurological: negative other than that listed in the HPI Allergy/Immunologic: negative other than that listed in the HPI    Objective:   Blood pressure (!) 142/90, pulse 76, resp. rate 20. There is no height or weight on file to calculate BMI.   Physical Exam:  General: Alert, interactive, in no acute distress. Pleasant male.  Eyes: No conjunctival injection bilaterally, no discharge on the right, no discharge on the left and no Horner-Trantas dots present. PERRL bilaterally. EOMI without pain. No photophobia.  Ears: Right TM pearly gray with normal light reflex, Left TM pearly gray with normal light reflex, Right TM unable to be visualized due to cerumen impaction and Left TM unable to be visualized due to cerumen impaction.  Nose/Throat: External nose within normal limits and septum midline. Turbinates edematous and pale without discharge. Posterior oropharynx mildly erythematous without cobblestoning in the posterior oropharynx. Tonsils 2+ without exudates.  Tongue without thrush. Adenopathy: shoddy bilateral anterior cervical lymphadenopathy Lungs: Clear to auscultation without wheezing, rhonchi or rales. No increased work of breathing. CV: Normal S1/S2. No murmurs. Capillary refill <2 seconds.  Skin: Warm and dry, without lesions or rashes. Neuro:   Grossly intact. No focal deficits appreciated. Responsive to questions.  Diagnostic studies: none     [name], MD [facility]

## 2016-12-05 DIAGNOSIS — N5201 Erectile dysfunction due to arterial insufficiency: Secondary | ICD-10-CM | POA: Diagnosis not present

## 2016-12-05 DIAGNOSIS — N400 Enlarged prostate without lower urinary tract symptoms: Secondary | ICD-10-CM | POA: Diagnosis not present

## 2016-12-06 ENCOUNTER — Ambulatory Visit (INDEPENDENT_AMBULATORY_CARE_PROVIDER_SITE_OTHER): Payer: PPO | Admitting: *Deleted

## 2016-12-06 DIAGNOSIS — M79672 Pain in left foot: Secondary | ICD-10-CM | POA: Diagnosis not present

## 2016-12-06 DIAGNOSIS — M216X2 Other acquired deformities of left foot: Secondary | ICD-10-CM | POA: Diagnosis not present

## 2016-12-06 DIAGNOSIS — J309 Allergic rhinitis, unspecified: Secondary | ICD-10-CM

## 2016-12-13 ENCOUNTER — Ambulatory Visit (INDEPENDENT_AMBULATORY_CARE_PROVIDER_SITE_OTHER): Payer: PPO

## 2016-12-13 DIAGNOSIS — J309 Allergic rhinitis, unspecified: Secondary | ICD-10-CM

## 2016-12-20 ENCOUNTER — Ambulatory Visit (INDEPENDENT_AMBULATORY_CARE_PROVIDER_SITE_OTHER): Payer: PPO | Admitting: *Deleted

## 2016-12-20 DIAGNOSIS — J309 Allergic rhinitis, unspecified: Secondary | ICD-10-CM

## 2016-12-27 ENCOUNTER — Ambulatory Visit (INDEPENDENT_AMBULATORY_CARE_PROVIDER_SITE_OTHER): Payer: PPO | Admitting: *Deleted

## 2016-12-27 DIAGNOSIS — J309 Allergic rhinitis, unspecified: Secondary | ICD-10-CM | POA: Diagnosis not present

## 2017-01-03 ENCOUNTER — Telehealth: Payer: Self-pay | Admitting: Allergy & Immunology

## 2017-01-03 ENCOUNTER — Ambulatory Visit (INDEPENDENT_AMBULATORY_CARE_PROVIDER_SITE_OTHER): Payer: PPO

## 2017-01-03 DIAGNOSIS — J309 Allergic rhinitis, unspecified: Secondary | ICD-10-CM

## 2017-01-03 DIAGNOSIS — K219 Gastro-esophageal reflux disease without esophagitis: Secondary | ICD-10-CM

## 2017-01-03 MED ORDER — PANTOPRAZOLE SODIUM 40 MG PO TBEC: 40.0000 mg | DELAYED_RELEASE_TABLET | Freq: Every day | ORAL | 5 refills | Status: DC

## 2017-01-03 NOTE — Telephone Encounter (Signed)
[name] Harper pulled me aside to discuss continued issues he was having with marked phlegm production which is worse in the morning. We had provided samples of antihistamines for him to try and started him on omeprazole 20mg  daily. Neither of these have provided much help. He continues to have phlegm production that is worse in the morning and improves over the course of the day. He does endorse some abdominal pain as well. He does have a history of some kind of abdominal surgery in the mid 2000s, but he does not know about it.   We will send in a script for pantoprazole 40mg  once daily in lieu of omeprazole. We will also refer to GI for further workup.   [name], MD [facility] [place]

## 2017-01-03 NOTE — Telephone Encounter (Signed)
Patient came in today to get his injections and spoke with Dr. Ernst Bowler about medication management. Dr. Ernst Bowler ask to switch him from Prilosec to Protonix 40 mg daily.

## 2017-01-03 NOTE — Addendum Note (Signed)
Addended by: Valentina Shaggy on: 01/03/2017 12:04 PM   Modules accepted: Orders

## 2017-01-09 ENCOUNTER — Encounter: Payer: Self-pay | Admitting: Internal Medicine

## 2017-01-10 ENCOUNTER — Ambulatory Visit (INDEPENDENT_AMBULATORY_CARE_PROVIDER_SITE_OTHER): Payer: PPO

## 2017-01-10 DIAGNOSIS — J309 Allergic rhinitis, unspecified: Secondary | ICD-10-CM

## 2017-01-17 ENCOUNTER — Ambulatory Visit (INDEPENDENT_AMBULATORY_CARE_PROVIDER_SITE_OTHER): Payer: PPO | Admitting: *Deleted

## 2017-01-17 DIAGNOSIS — J309 Allergic rhinitis, unspecified: Secondary | ICD-10-CM | POA: Diagnosis not present

## 2017-02-06 DIAGNOSIS — Z0001 Encounter for general adult medical examination with abnormal findings: Secondary | ICD-10-CM | POA: Diagnosis not present

## 2017-02-08 DIAGNOSIS — I10 Essential (primary) hypertension: Secondary | ICD-10-CM | POA: Diagnosis not present

## 2017-02-08 DIAGNOSIS — Z6836 Body mass index (BMI) 36.0-36.9, adult: Secondary | ICD-10-CM | POA: Diagnosis not present

## 2017-02-08 DIAGNOSIS — E7800 Pure hypercholesterolemia, unspecified: Secondary | ICD-10-CM | POA: Diagnosis not present

## 2017-02-08 DIAGNOSIS — R110 Nausea: Secondary | ICD-10-CM | POA: Diagnosis not present

## 2017-02-08 DIAGNOSIS — E119 Type 2 diabetes mellitus without complications: Secondary | ICD-10-CM | POA: Diagnosis not present

## 2017-02-08 DIAGNOSIS — R945 Abnormal results of liver function studies: Secondary | ICD-10-CM | POA: Diagnosis not present

## 2017-02-08 DIAGNOSIS — Z0001 Encounter for general adult medical examination with abnormal findings: Secondary | ICD-10-CM | POA: Diagnosis not present

## 2017-02-08 DIAGNOSIS — D2230 Melanocytic nevi of unspecified part of face: Secondary | ICD-10-CM | POA: Diagnosis not present

## 2017-02-11 DIAGNOSIS — L820 Inflamed seborrheic keratosis: Secondary | ICD-10-CM | POA: Diagnosis not present

## 2017-02-11 DIAGNOSIS — D485 Neoplasm of uncertain behavior of skin: Secondary | ICD-10-CM | POA: Diagnosis not present

## 2017-02-24 DIAGNOSIS — R945 Abnormal results of liver function studies: Secondary | ICD-10-CM | POA: Diagnosis not present

## 2017-02-24 DIAGNOSIS — R110 Nausea: Secondary | ICD-10-CM | POA: Diagnosis not present

## 2017-02-24 DIAGNOSIS — K760 Fatty (change of) liver, not elsewhere classified: Secondary | ICD-10-CM | POA: Diagnosis not present

## 2017-03-02 ENCOUNTER — Other Ambulatory Visit: Payer: Self-pay | Admitting: *Deleted

## 2017-03-02 ENCOUNTER — Ambulatory Visit (INDEPENDENT_AMBULATORY_CARE_PROVIDER_SITE_OTHER): Payer: PPO | Admitting: Gastroenterology

## 2017-03-02 ENCOUNTER — Encounter: Payer: Self-pay | Admitting: Gastroenterology

## 2017-03-02 ENCOUNTER — Encounter: Payer: Self-pay | Admitting: *Deleted

## 2017-03-02 ENCOUNTER — Telehealth: Payer: Self-pay | Admitting: *Deleted

## 2017-03-02 DIAGNOSIS — Z1211 Encounter for screening for malignant neoplasm of colon: Secondary | ICD-10-CM

## 2017-03-02 DIAGNOSIS — K219 Gastro-esophageal reflux disease without esophagitis: Secondary | ICD-10-CM

## 2017-03-02 DIAGNOSIS — R945 Abnormal results of liver function studies: Secondary | ICD-10-CM | POA: Diagnosis not present

## 2017-03-02 DIAGNOSIS — R110 Nausea: Secondary | ICD-10-CM | POA: Diagnosis not present

## 2017-03-02 DIAGNOSIS — R7989 Other specified abnormal findings of blood chemistry: Secondary | ICD-10-CM | POA: Insufficient documentation

## 2017-03-02 MED ORDER — DEXLANSOPRAZOLE 60 MG PO CPDR: 60.0000 mg | DELAYED_RELEASE_CAPSULE | Freq: Every day | ORAL | 5 refills | Status: DC

## 2017-03-02 MED ORDER — PEG 3350-KCL-NA BICARB-NACL 420 G PO SOLR: 4000.0000 mL | Freq: Once | ORAL | 0 refills | Status: AC

## 2017-03-02 NOTE — Progress Notes (Addendum)
PT PREPPED ON THE WRONG DAY. REVIEWED-NO ADDITIONAL RECOMMENDATIONS.  Primary Care Physician:  Manon Hilding, MD  Primary Gastroenterologist:  Garfield Cornea, MD   Chief Complaint  Patient presents with  . Gastroesophageal Reflux    HPI:  [name] Harper is a 57 y.o. male here at the request of Dr. Ernst Bowler for further evaluation of reflux.  Patient seen back in 2007/2008 by our practice for reflux.  He underwent an EGD in 2007 for 59-monthhistory of chronic cough, some reflux symptoms.  Zegerid did not help.  He had distal esophageal erosions, small hiatal hernia, antral and bulbar erosions, diffuse gastric submucosa petechiae.  H. pylori serologies were positive and he was treated.  In 2008 had another endoscopy along with Bravo pH probe.  Persistent cough and phlegm buildup in the back of his throat unresponsive to Nexium twice daily, AcipHex.  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 pylori.  PH probe performed off PPI noting excessive gastroesophageal reflux with good correlation between cough and reflux.  [facility].  Did well until about 3 years ago.  Started having issues with allergies and sinuses real bad.  Has been on 2 different nasal sprays to help control the symptoms and was doing good until about 7 months ago when he developed coughing again.  Developed sensation of a lot of phlegm in the throat especially in the mornings or when he showers.  Has to cough until is all cleared out.  Feels like it is coming from deep in the stomach.  He complains of frequent nausea, does not vomit.  Denies heartburn or dysphagia.  Bowel movements are regular 3-4 times a day.  No melena or rectal bleeding.  Allergy injections started 3 years ago.  Nasal spray helps very little.   Patient states he recently found out that he had abnormal AST/ALT of 71/82  respectively.  And apparently they were abnormal last time he had a several months ago 82/80.  Alk phos and bilirubin were normal.  Glucose 127.  [facility]  He had an ultrasound to evaluate abnormal LFTs.  This was done February 24, 2017.  Evidence of fatty liver on that study.  Weight is up approximately 10 pounds since his knee replacement September 2016  Current Outpatient Medications  Medication Sig Dispense Refill  . azelastine (ASTELIN) 0.1 % nasal spray Use 1-2 sprays per nostril twice daily 30 mL 5  . citalopram (CELEXA) 20 MG tablet Take 20 mg by mouth daily.     . clonazePAM (KLONOPIN) 0.5 MG tablet Take 0.5 mg by mouth every 8 (eight) hours as needed. anxiety    . hydrochlorothiazide (HYDRODIURIL) 25 MG tablet Take 25 mg by mouth every morning.     . mometasone (NASONEX) 50 MCG/ACT nasal spray USE 2 SPRAYS IN EACH NOSTRIL DAILY 17 g 5  . simvastatin (ZOCOR) 20 MG tablet Take 20 mg by mouth at bedtime. Reported on 07/28/2015    . EPINEPHrine 0.3 mg/0.3 mL IJ SOAJ injection USE AS DIRECTED FOR A SEVERE ALLERGIC REACTION. (Patient not taking: Reported on 03/02/2017) 2 Device 2   No current facility-administered medications for this visit.    Facility-Administered Medications Ordered in Other Visits  Medication Dose Route Frequency Provider Last Rate Last Dose  . bupivacaine liposome (EXPAREL) 1.3 % injection 266 mg  20 mL Infiltration Once Constable, Amber, PA-C        Allergies as of 03/02/2017  . (No Known Allergies)    Past Medical History:  Diagnosis Date  . Anxiety   . Arthritis   . Bronchitis   . Complication of anesthesia   . Depression   . Hypercholesterolemia   . Hypertension   . PONV (postoperative nausea and vomiting)     Past Surgical History:  Procedure Laterality Date  . ABDOMINAL SURGERY  2007   hiatal hernia repair  . KNEE ARTHROSCOPY  2016   rt knee  . TOTAL KNEE ARTHROPLASTY Right 10/29/2014   Procedure: RIGHT TOTAL  KNEE ARTHROPLASTY;  Surgeon: Ronald Gioffre, MD;  Location: [facility];  Service: Orthopedics;  Laterality: Right;    Family History  Problem Relation Age of Onset  . Lung disease Mother   . Colon cancer Neg Hx     Social History   Socioeconomic History  . Marital status: Married    Spouse name: Not on file  . Number of children: Not on file  . Years of education: Not on file  . Highest education level: Not on file  Social Needs  . Financial resource strain: Not on file  . Food insecurity - worry: Not on file  . Food insecurity - inability: Not on file  . Transportation needs - medical: Not on file  . Transportation needs - non-medical: Not on file  Occupational History  . Not on file  Tobacco Use  . Smoking status: Never Smoker  . Smokeless tobacco: Never Used  Substance and Sexual Activity  . Alcohol use: No    Alcohol/week: 0.0 oz  . Drug use: No  . Sexual activity: Not on file  Other Topics Concern  . Not on file  Social History Narrative  . Not on file      ROS:  General: Negative for anorexia, weight loss, fever, chills, fatigue, weakness. Eyes: Negative for vision changes.  ENT: Negative for hoarseness, difficulty swallowing , nasal congestion. CV: Negative for chest pain, angina, palpitations, dyspnea on exertion, peripheral edema.  Respiratory: Negative for dyspnea at rest, dyspnea on exertion, cough, sputum, wheezing.  GI: See history of present illness. GU:  Negative for dysuria, hematuria, urinary incontinence, urinary frequency, nocturnal urination.  MS: Negative for joint pain, low back pain.  Derm: Negative for rash or itching.  Neuro: Negative for weakness, abnormal sensation, seizure, frequent headaches, memory loss, confusion.  Psych: Negative for suicidal ideation, hallucinations.  Depression and anxiety controlled. Endo: Negative for unusual weight change.  Heme: Negative for bruising or bleeding. Allergy: Negative for rash or hives.     Physical Examination:  BP (!) 145/90   Pulse (!) 108   Temp 97.8 F (36.6 C) (Oral)   Ht 5' 4" (1.626 m)   Wt 193 lb 9.6 oz (87.8 kg)   BMI 33.23 kg/m    General: Well-nourished, well-developed in no acute distress.  Head: Normocephalic, atraumatic.   Eyes: Conjunctiva pink, no icterus. Mouth: Oropharyngeal mucosa moist and pink , no lesions erythema or exudate. Neck: Supple without thyromegaly, masses, or lymphadenopathy.  Lungs: Clear to auscultation bilaterally.  Heart: Regular rate and rhythm, no murmurs rubs or gallops.  Abdomen: Bowel sounds are normal, nontender, nondistended, no hepatosplenomegaly or masses, no abdominal bruits or    hernia , no rebound or guarding.   Rectal: Not performed Extremities: No lower extremity edema. No clubbing or deformities.  Neuro: Alert and oriented x 4 ,   grossly normal neurologically.  Skin: Warm and dry, no rash or jaundice.   Psych: Alert and cooperative, normal mood and affect.  Labs: As outlined above  Imaging Studies: No results found.   

## 2017-03-02 NOTE — Telephone Encounter (Signed)
LMOVM. Pre-op scheduled for 03/28/17 at 10:00am. Letter mailed to pt.

## 2017-03-02 NOTE — Progress Notes (Signed)
CC'D TO PCP °

## 2017-03-02 NOTE — Assessment & Plan Note (Signed)
57 year old gentleman with history of GERD, well-documented in 2008 via pH probe study.  Also well-documented correlation with cough and reflux during the study.  Patient tells me that he did have antireflux surgery back in 2008 in [place] as recommended by Dr. Gala Romney.  I do not have access to that record.  Did well up until the last 7 months.  Denies typical heartburn but has developed chronic cough again.  Predominantly worse when he first wakes up but also worse with showers.  His allergist has been approaching his cough from a standpoint of postnasal drip and allergies without resolution of symptoms.  Patient denies shortness of breath.  Complains of "deep" cough white stuff clears it out he is fine the rest of the day.  He does endorse frequent nausea.  Short trial of omeprazole in the past couple months really has not helped.  Patient requesting upper endoscopy given frequent nausea and history of reflux esophagitis, this is not unreasonable.  Can evaluate wrap is still intact as well.  Plan for EGD with deep sedation in the near future.  I have discussed the risks, alternatives, benefits with regards to but not limited to the risk of reaction to medication, bleeding, infection, perforation and the patient is agreeable to proceed. Written consent to be obtained.  Start Dexilant 60 mg daily before breakfast.  Reinforced antireflux measures

## 2017-03-02 NOTE — Assessment & Plan Note (Signed)
Plan for first ever screening colonoscopy in the near future.  Given Celexa and Klonopin use, plan for deep sedation with propofol.  I have discussed the risks, alternatives, benefits with regards to but not limited to the risk of reaction to medication, bleeding, infection, perforation and the patient is agreeable to proceed. Written consent to be obtained.

## 2017-03-02 NOTE — Assessment & Plan Note (Signed)
AST/ALT 2 times upper limits of normal, fatty liver on ultrasound.  Borderline DM, starting diet training.  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  Last 2 years.  Suspect liver includes viral hepatitis and hemochromatosis.  Instructions for fatty liver: Recommend 1-2# weight loss per week until ideal body weight through exercise & diet. Low fat/cholesterol diet.   Avoid sweets, sodas, fruit juices, sweetened beverages like tea, etc. Gradually increase exercise from 15 min daily up to 1 hr per day 5 days/week. Limit alcohol use.

## 2017-03-02 NOTE — Patient Instructions (Addendum)
1. Colonoscopy and upper endoscopy as scheduled. See separate instructions.  2. Please have your labs done.  3. Start Dexilant once daily before breakfast.

## 2017-03-03 LAB — HEPATITIS B SURFACE ANTIGEN: Hepatitis B Surface Ag: NONREACTIVE

## 2017-03-03 LAB — IRON,TIBC AND FERRITIN PANEL
%SAT: 23 % (calc) (ref 15–60)
Ferritin: 158 ng/mL (ref 20–380)
IRON: 93 ug/dL (ref 50–180)
TIBC: 409 ug/dL (ref 250–425)

## 2017-03-03 LAB — HEPATITIS C ANTIBODY
Hepatitis C Ab: NONREACTIVE
SIGNAL TO CUT-OFF: 0.02 (ref <1.00)

## 2017-03-03 NOTE — Progress Notes (Signed)
Please let patient know hit hep b and c markers were negative. His iron studies normal. Likely has fatty liver.   Instructions for fatty liver: Recommend 1-2# weight loss per week until ideal body weight through exercise & diet. Low fat/cholesterol diet.   Avoid sweets, sodas, fruit juices, sweetened beverages like tea, etc. Gradually increase exercise from 15 min daily up to 1 hr per day 5 days/week. Limit alcohol use.

## 2017-03-03 NOTE — Progress Notes (Signed)
Supposed to read HIS hep B and C markers were negative.  Procedures as scheduled.

## 2017-03-07 ENCOUNTER — Ambulatory Visit: Payer: PPO | Admitting: Allergy & Immunology

## 2017-03-07 ENCOUNTER — Encounter: Payer: Self-pay | Admitting: Allergy & Immunology

## 2017-03-07 VITALS — BP 130/74 | HR 88 | Resp 16

## 2017-03-07 DIAGNOSIS — J302 Other seasonal allergic rhinitis: Secondary | ICD-10-CM | POA: Diagnosis not present

## 2017-03-07 DIAGNOSIS — K219 Gastro-esophageal reflux disease without esophagitis: Secondary | ICD-10-CM | POA: Diagnosis not present

## 2017-03-07 DIAGNOSIS — J3089 Other allergic rhinitis: Secondary | ICD-10-CM

## 2017-03-07 NOTE — Progress Notes (Signed)
FOLLOW UP  Date of Service/Encounter:  03/07/17   Assessment:   Gastroesophageal reflux disease  Seasonal and perennial allergic rhinitis - s/p four years of immunotherapy  Plan/Recommendations:   1. Allergic rhinoconjunctivitis - stopped allergy shots - Continue mometasone nasal spray one spray per nostril daily. - Continue with azelastine one spray twice daily. - Continue with Xyzal 5mg  daily.   2. GERD    - I agree with the change to dexlansoprazole.  - I look forward to seeing the results of the endoscopy.   3. Return in about 6 months (around 09/04/2017).   Subjective:   [name] Harper is a 57 y.o. male presenting today for follow up of  Chief Complaint  Patient presents with  . Allergic Rhinitis     stopped itx due to not feeling they helped    [name] Harper has a history of the following: Patient Active Problem List   Diagnosis Date Noted  . Encounter for screening colonoscopy 03/02/2017  . Abnormal LFTs 03/02/2017  . Nausea without vomiting 03/02/2017  . Post-nasal drip 11/29/2016  . Perennial allergic rhinitis 11/29/2016  . Gastroesophageal reflux disease 11/29/2016  . 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  [name] Harper Primary Care Provider is Sasser, Silvestre Moment, MD.     [name] Harper is a 57 y.o. male presenting for a follow up visit. He was last seen in October 2018. He has a history of allergic rhinitis with sensitizations to grasses, weeds, trees, molds, DM, dog, cat, and cockroach. He is on immunotherapy and was doing well at the last visit. At the last visit, we continued him on mometasone one spray per nostril daily as well as azelastine one spray twice daily. We stopped the cetirizine and recommended trying Allegra and Xyzal to see which one is working better.   Since the last visit, he has mostly done well. He actually decided to stop his allergy injections since he did not feel that they  were providing the help that they once did. He was on them for four years now. He does not feel worse being off of his allergy shots. He feels since on his pills. He remains on the nasal sprays. He has been on allergy shots for four years.   He did go to see a Copywriter, advertising and he was started on Dexilant. He is also being worked up for a "failed liver". Evidently the LFTs were elevated, although I cannot see that testing in our system. Hepatitis testing was negative. He does have an endoscopy scheduled for early February (04/03/17). He reports feeling much better on the new reflux medication and is glad that we referred him.   Otherwise, there have been no changes to his past medical history, surgical history, family history, or social history.    Review of Systems: a 14-point review of systems is pertinent for what is mentioned in HPI.  Otherwise, all other systems were negative. Constitutional: negative other than that listed in the HPI Eyes: negative other than that listed in the HPI Ears, nose, mouth, throat, and face: negative other than that listed in the HPI Respiratory: negative other than that listed in the HPI Cardiovascular: negative other than that listed in the HPI Gastrointestinal: negative other than that listed in the HPI Genitourinary: negative other than that listed in the HPI Integument: negative other than that listed in the HPI Hematologic: negative other than that listed in the HPI Musculoskeletal:  negative other than that listed in the HPI Neurological: negative other than that listed in the HPI Allergy/Immunologic: negative other than that listed in the HPI    Objective:   Blood pressure 130/74, pulse 88, resp. rate 16. There is no height or weight on file to calculate BMI.   Physical Exam:  General: Alert, interactive, in no acute distress. Eyes: No conjunctival injection bilaterally, no discharge on the right, no discharge on the left and no  Horner-Trantas dots present. PERRL bilaterally. EOMI without pain. No photophobia.  Ears: Right TM pearly gray with normal light reflex, Left TM pearly gray with normal light reflex, Right TM intact without perforation and Left TM intact without perforation.  Nose/Throat: External nose within normal limits and septum midline. Turbinates edematous and pale with clear discharge. Posterior oropharynx erythematous without cobblestoning in the posterior oropharynx. Tonsils 2+ without exudates.  Tongue without thrush. Adenopathy: no enlarged lymph nodes appreciated in the anterior cervical, occipital, axillary, epitrochlear, inguinal, or popliteal regions. Lungs: Clear to auscultation without wheezing, rhonchi or rales. No increased work of breathing. CV: Normal S1/S2. No murmurs. Capillary refill <2 seconds.  Skin: Warm and dry, without lesions or rashes. Neuro:   Grossly intact. No focal deficits appreciated. Responsive to questions.  Diagnostic studies: none      [name], MD [facility]

## 2017-03-07 NOTE — Patient Instructions (Addendum)
1. Allergic rhinoconjunctivitis - stopped allergy shots - Continue mometasone nasal spray one spray per nostril daily. - Continue with azelastine one spray twice daily. - Continue with Xyzal 5mg  daily.   2. GERD    - I agree with the change to dexlansoprazole.  - I look forward to seeing the results of the endoscopy.   3. Return in about 6 months (around 09/04/2017).    Please inform us of any Emergency Department visits, hospitalizations, or changes in symptoms. Call us before going to the ED for breathing or allergy symptoms since we might be able to fit you in for a sick visit. Feel free to contact us anytime with any questions, problems, or concerns.  It was a pleasure to see you again today! Happy New Year!   Websites that have reliable patient information: 1. American Academy of Asthma, Allergy, and Immunology: www.aaaai.org 2. Food Allergy Research and Education (FARE): foodallergy.org 3. Mothers of Asthmatics: http://www.asthmacommunitynetwork.org 4. American College of Allergy, Asthma, and Immunology: www.acaai.org

## 2017-03-08 NOTE — Progress Notes (Signed)
Forgot, but we need to repeat LFTs in six months.

## 2017-03-09 ENCOUNTER — Other Ambulatory Visit: Payer: Self-pay

## 2017-03-09 DIAGNOSIS — K219 Gastro-esophageal reflux disease without esophagitis: Secondary | ICD-10-CM

## 2017-03-24 NOTE — Patient Instructions (Signed)
[name] Harper  03/24/2017     @PREFPERIOPPHARMACY @   Your procedure is scheduled on  04/03/2017 .  Report to [facility] at  815   A.M.  Call this number if you have problems the morning of surgery:  [phone]   Remember:  Do not eat food or drink liquids after midnight.  Take these medicines the morning of surgery with A SIP OF WATER  Celexa, klonopin, dexilant.   Do not wear jewelry, make-up or nail polish.  Do not wear lotions, powders, or perfumes, or deodorant.  Do not shave 48 hours prior to surgery.  Men may shave face and neck.  Do not bring valuables to the hospital.  [facility] is not responsible for any belongings or valuables.  Contacts, dentures or bridgework may not be worn into surgery.  Leave your suitcase in the car.  After surgery it may be brought to your room.  For patients admitted to the hospital, discharge time will be determined by your treatment team.  Patients discharged the day of surgery will not be allowed to drive home.   Name and phone number of your driver:   family Special instructions:  Follow the diet and prep instructions given to you by Dr Roseanne Kaufman office.  Please read over the following fact sheets that you were given. Anesthesia Post-op Instructions and Care and Recovery After Surgery       Esophagogastroduodenoscopy Esophagogastroduodenoscopy (EGD) is a procedure to examine the lining of the esophagus, stomach, and first part of the small intestine (duodenum). This procedure is done to check for problems such as inflammation, bleeding, ulcers, or growths. During this procedure, a long, flexible, lighted tube with a camera attached (endoscope) is inserted down the throat. Tell a health care provider about:  Any allergies you have.  All medicines you are taking, including vitamins, herbs, eye drops, creams, and over-the-counter medicines.  Any problems you or family members have had with anesthetic  medicines.  Any blood disorders you have.  Any surgeries you have had.  Any medical conditions you have.  Whether you are pregnant or may be pregnant. What are the risks? Generally, this is a safe procedure. However, problems may occur, including:  Infection.  Bleeding.  A tear (perforation) in the esophagus, stomach, or duodenum.  Trouble breathing.  Excessive sweating.  Spasms of the larynx.  A slowed heartbeat.  Low blood pressure.  What happens before the procedure?  Follow instructions from your health care provider about eating or drinking restrictions.  Ask your health care provider about: ? Changing or stopping your regular medicines. This is especially important if you are taking diabetes medicines or blood thinners. ? Taking medicines such as aspirin and ibuprofen. These medicines can thin your blood. Do not take these medicines before your procedure if your health care provider instructs you not to.  Plan to have someone take you home after the procedure.  If you wear dentures, be ready to remove them before the procedure. What happens during the procedure?  To reduce your risk of infection, your health care team will wash or sanitize their hands.  An IV tube will be put in a vein in your hand or arm. You will get medicines and fluids through this tube.  You will be given one or more of the following: ? A medicine to help you relax (sedative). ? A medicine to numb the area (local  anesthetic). This medicine may be sprayed into your throat. It will make you feel more comfortable and keep you from gagging or coughing during the procedure. ? A medicine for pain.  A mouth guard may be placed in your mouth to protect your teeth and to keep you from biting on the endoscope.  You will be asked to lie on your left side.  The endoscope will be lowered down your throat into your esophagus, stomach, and duodenum.  Air will be put into the endoscope. This will  help your health care provider see better.  The lining of your esophagus, stomach, and duodenum will be examined.  Your health care provider may: ? Take a tissue sample so it can be looked at in a lab (biopsy). ? Remove growths. ? Remove objects (foreign bodies) that are stuck. ? Treat any bleeding with medicines or other devices that stop tissue from bleeding. ? Widen (dilate) or stretch narrowed areas of your esophagus and stomach.  The endoscope will be taken out. The procedure may vary among health care providers and hospitals. What happens after the procedure?  Your blood pressure, heart rate, breathing rate, and blood oxygen level will be monitored often until the medicines you were given have worn off.  Do not eat or drink anything until the numbing medicine has worn off and your gag reflex has returned. This information is not intended to replace advice given to you by your health care provider. Make sure you discuss any questions you have with your health care provider. Document Released: 06/10/2004 Document Revised: 07/16/2015 Document Reviewed: 01/01/2015 Elsevier Interactive Patient Education  2018 [employer]. Esophagogastroduodenoscopy, Care After Refer to this sheet in the next few weeks. These instructions provide you with information about caring for yourself after your procedure. Your health care provider may also give you more specific instructions. Your treatment has been planned according to current medical practices, but problems sometimes occur. Call your health care provider if you have any problems or questions after your procedure. What can I expect after the procedure? After the procedure, it is common to have:  A sore throat.  Nausea.  Bloating.  Dizziness.  Fatigue.  Follow these instructions at home:  Do not eat or drink anything until the numbing medicine (local anesthetic) has worn off and your gag reflex has returned. You will know that the  local anesthetic has worn off when you can swallow comfortably.  Do not drive for 24 hours if you received a medicine to help you relax (sedative).  If your health care provider took a tissue sample for testing during the procedure, make sure to get your test results. This is your responsibility. Ask your health care provider or the department performing the test when your results will be ready.  Keep all follow-up visits as told by your health care provider. This is important. Contact a health care provider if:  You cannot stop coughing.  You are not urinating.  You are urinating less than usual. Get help right away if:  You have trouble swallowing.  You cannot eat or drink.  You have throat or chest pain that gets worse.  You are dizzy or light-headed.  You faint.  You have nausea or vomiting.  You have chills.  You have a fever.  You have severe abdominal pain.  You have black, tarry, or bloody stools. This information is not intended to replace advice given to you by your health care provider. Make sure you discuss any  questions you have with your health care provider. Document Released: 01/25/2012 Document Revised: 07/16/2015 Document Reviewed: 01/01/2015 Elsevier Interactive Patient Education  2018 [employer].  Colonoscopy, Adult A colonoscopy is an exam to look at the large intestine. It is done to check for problems, such as:  Lumps (tumors).  Growths (polyps).  Swelling (inflammation).  Bleeding.  What happens before the procedure? Eating and drinking Follow instructions from your doctor about eating and drinking. These instructions may include:  A few days before the procedure - follow a low-fiber diet. ? Avoid nuts. ? Avoid seeds. ? Avoid dried fruit. ? Avoid raw fruits. ? Avoid vegetables.  1-3 days before the procedure - follow a clear liquid diet. Avoid liquids that have red or purple dye. Drink only clear liquids, such as: ? Clear broth  or bouillon. ? Black coffee or tea. ? Clear juice. ? Clear soft drinks or sports drinks. ? Gelatin dessert. ? Popsicles.  On the day of the procedure - do not eat or drink anything during the 2 hours before the procedure.  Bowel prep If you were prescribed an oral bowel prep:  Take it as told by your doctor. Starting the day before your procedure, you will need to drink a lot of liquid. The liquid will cause you to poop (have bowel movements) until your poop is almost clear or light green.  If your skin or butt gets irritated from diarrhea, you may: ? Wipe the area with wipes that have medicine in them, such as adult wet wipes with aloe and vitamin E. ? Put something on your skin that soothes the area, such as petroleum jelly.  If you throw up (vomit) while drinking the bowel prep, take a break for up to 60 minutes. Then begin the bowel prep again. If you keep throwing up and you cannot take the bowel prep without throwing up, call your doctor.  General instructions  Ask your doctor about changing or stopping your normal medicines. This is important if you take diabetes medicines or blood thinners.  Plan to have someone take you home from the hospital or clinic. What happens during the procedure?  An IV tube may be put into one of your veins.  You will be given medicine to help you relax (sedative).  To reduce your risk of infection: ? Your doctors will wash their hands. ? Your anal area will be washed with soap.  You will be asked to lie on your side with your knees bent.  Your doctor will get a long, thin, flexible tube ready. The tube will have a camera and a light on the end.  The tube will be put into your anus.  The tube will be gently put into your large intestine.  Air will be delivered into your large intestine to keep it open. You may feel some pressure or cramping.  The camera will be used to take photos.  A small tissue sample may be removed from your body  to be looked at under a microscope (biopsy). If any possible problems are found, the tissue will be sent to a lab for testing.  If small growths are found, your doctor may remove them and have them checked for cancer.  The tube that was put into your anus will be slowly removed. The procedure may vary among doctors and hospitals. What happens after the procedure?  Your doctor will check on you often until the medicines you were given have worn off.  Do not  drive for 24 hours after the procedure.  You may have a small amount of blood in your poop.  You may pass gas.  You may have mild cramps or bloating in your belly (abdomen).  It is up to you to get the results of your procedure. Ask your doctor, or the department performing the procedure, when your results will be ready. This information is not intended to replace advice given to you by your health care provider. Make sure you discuss any questions you have with your health care provider. Document Released: 03/12/2010 Document Revised: 12/09/2015 Document Reviewed: 04/21/2015 Elsevier Interactive Patient Education  2017 [employer]  Colonoscopy, Adult, Care After This sheet gives you information about how to care for yourself after your procedure. Your health care provider may also give you more specific instructions. If you have problems or questions, contact your health care provider. What can I expect after the procedure? After the procedure, it is common to have:  A small amount of blood in your stool for 24 hours after the procedure.  Some gas.  Mild abdominal cramping or bloating.  Follow these instructions at home: General instructions   For the first 24 hours after the procedure: ? Do not drive or use machinery. ? Do not sign important documents. ? Do not drink alcohol. ? Do your regular daily activities at a slower pace than normal. ? Eat soft, easy-to-digest foods. ? Rest often.  Take over-the-counter or  prescription medicines only as told by your health care provider.  It is up to you to get the results of your procedure. Ask your health care provider, or the department performing the procedure, when your results will be ready. Relieving cramping and bloating  Try walking around when you have cramps or feel bloated.  Apply heat to your abdomen as told by your health care provider. Use a heat source that your health care provider recommends, such as a moist heat pack or a heating pad. ? Place a towel between your skin and the heat source. ? Leave the heat on for 20-30 minutes. ? Remove the heat if your skin turns bright red. This is especially important if you are unable to feel pain, heat, or cold. You may have a greater risk of getting burned. Eating and drinking  Drink enough fluid to keep your urine clear or pale yellow.  Resume your normal diet as instructed by your health care provider. Avoid heavy or fried foods that are hard to digest.  Avoid drinking alcohol for as long as instructed by your health care provider. Contact a health care provider if:  You have blood in your stool 2-3 days after the procedure. Get help right away if:  You have more than a small spotting of blood in your stool.  You pass large blood clots in your stool.  Your abdomen is swollen.  You have nausea or vomiting.  You have a fever.  You have increasing abdominal pain that is not relieved with medicine. This information is not intended to replace advice given to you by your health care provider. Make sure you discuss any questions you have with your health care provider. Document Released: 09/22/2003 Document Revised: 11/02/2015 Document Reviewed: 04/21/2015 Elsevier Interactive Patient Education  2018 [place] Anesthesia is a term that refers to techniques, procedures, and medicines that help a person stay safe and comfortable during a medical procedure.  Monitored anesthesia care, or sedation, is one type of anesthesia. Your  anesthesia specialist may recommend sedation if you will be having a procedure that does not require you to be unconscious, such as:  Cataract surgery.  A dental procedure.  A biopsy.  A colonoscopy.  During the procedure, you may receive a medicine to help you relax (sedative). There are three levels of sedation:  Mild sedation. At this level, you may feel awake and relaxed. You will be able to follow directions.  Moderate sedation. At this level, you will be sleepy. You may not remember the procedure.  Deep sedation. At this level, you will be asleep. You will not remember the procedure.  The more medicine you are given, the deeper your level of sedation will be. Depending on how you respond to the procedure, the anesthesia specialist may change your level of sedation or the type of anesthesia to fit your needs. An anesthesia specialist will monitor you closely during the procedure. Let your health care provider know about:  Any allergies you have.  All medicines you are taking, including vitamins, herbs, eye drops, creams, and over-the-counter medicines.  Any use of steroids (by mouth or as a cream).  Any problems you or family members have had with sedatives and anesthetic medicines.  Any blood disorders you have.  Any surgeries you have had.  Any medical conditions you have, such as sleep apnea.  Whether you are pregnant or may be pregnant.  Any use of cigarettes, alcohol, or street drugs. What are the risks? Generally, this is a safe procedure. However, problems may occur, including:  Getting too much medicine (oversedation).  Nausea.  Allergic reaction to medicines.  Trouble breathing. If this happens, a breathing tube may be used to help with breathing. It will be removed when you are awake and breathing on your own.  Heart trouble.  Lung trouble.  Before the procedure Staying  hydrated Follow instructions from your health care provider about hydration, which may include:  Up to 2 hours before the procedure - you may continue to drink clear liquids, such as water, clear fruit juice, black coffee, and plain tea.  Eating and drinking restrictions Follow instructions from your health care provider about eating and drinking, which may include:  8 hours before the procedure - stop eating heavy meals or foods such as meat, fried foods, or fatty foods.  6 hours before the procedure - stop eating light meals or foods, such as toast or cereal.  6 hours before the procedure - stop drinking milk or drinks that contain milk.  2 hours before the procedure - stop drinking clear liquids.  Medicines Ask your health care provider about:  Changing or stopping your regular medicines. This is especially important if you are taking diabetes medicines or blood thinners.  Taking medicines such as aspirin and ibuprofen. These medicines can thin your blood. Do not take these medicines before your procedure if your health care provider instructs you not to.  Tests and exams  You will have a physical exam.  You may have blood tests done to show: ? How well your kidneys and liver are working. ? How well your blood can clot.  General instructions  Plan to have someone take you home from the hospital or clinic.  If you will be going home right after the procedure, plan to have someone with you for 24 hours.  What happens during the procedure?  Your blood pressure, heart rate, breathing, level of pain and overall condition will be monitored.  An IV tube will  be inserted into one of your veins.  Your anesthesia specialist will give you medicines as needed to keep you comfortable during the procedure. This may mean changing the level of sedation.  The procedure will be performed. After the procedure  Your blood pressure, heart rate, breathing rate, and blood oxygen level  will be monitored until the medicines you were given have worn off.  Do not drive for 24 hours if you received a sedative.  You may: ? Feel sleepy, clumsy, or nauseous. ? Feel forgetful about what happened after the procedure. ? Have a sore throat if you had a breathing tube during the procedure. ? Vomit. This information is not intended to replace advice given to you by your health care provider. Make sure you discuss any questions you have with your health care provider. Document Released: 11/03/2004 Document Revised: 07/17/2015 Document Reviewed: 05/31/2015 Elsevier Interactive Patient Education  2018 [place], Care After These instructions provide you with information about caring for yourself after your procedure. Your health care provider may also give you more specific instructions. Your treatment has been planned according to current medical practices, but problems sometimes occur. Call your health care provider if you have any problems or questions after your procedure. What can I expect after the procedure? After your procedure, it is common to:  Feel sleepy for several hours.  Feel clumsy and have poor balance for several hours.  Feel forgetful about what happened after the procedure.  Have poor judgment for several hours.  Feel nauseous or vomit.  Have a sore throat if you had a breathing tube during the procedure.  Follow these instructions at home: For at least 24 hours after the procedure:   Do not: ? Participate in activities in which you could fall or become injured. ? Drive. ? Use heavy machinery. ? Drink alcohol. ? Take sleeping pills or medicines that cause drowsiness. ? Make important decisions or sign legal documents. ? Take care of children on your own.  Rest. Eating and drinking  Follow the diet that is recommended by your health care provider.  If you vomit, drink water, juice, or soup when you can drink without  vomiting.  Make sure you have little or no nausea before eating solid foods. General instructions  Have a responsible adult stay with you until you are awake and alert.  Take over-the-counter and prescription medicines only as told by your health care provider.  If you smoke, do not smoke without supervision.  Keep all follow-up visits as told by your health care provider. This is important. Contact a health care provider if:  You keep feeling nauseous or you keep vomiting.  You feel light-headed.  You develop a rash.  You have a fever. Get help right away if:  You have trouble breathing. This information is not intended to replace advice given to you by your health care provider. Make sure you discuss any questions you have with your health care provider. Document Released: 05/31/2015 Document Revised: 09/30/2015 Document Reviewed: 05/31/2015 Elsevier Interactive Patient Education  [employer].

## 2017-03-28 ENCOUNTER — Ambulatory Visit (HOSPITAL_COMMUNITY)
Admission: RE | Admit: 2017-03-28 | Discharge: 2017-03-28 | Disposition: A | Discharge status: Home / Self Care | Payer: PPO | Source: Ambulatory Visit | Attending: Gastroenterology | Admitting: Gastroenterology

## 2017-03-28 ENCOUNTER — Encounter (HOSPITAL_COMMUNITY)
Admission: RE | Admit: 2017-03-28 | Discharge: 2017-03-28 | Disposition: A | Discharge status: Home / Self Care | Payer: PPO | Source: Ambulatory Visit | Attending: Internal Medicine | Admitting: Internal Medicine

## 2017-03-28 ENCOUNTER — Ambulatory Visit (HOSPITAL_COMMUNITY): Payer: PPO | Admitting: Anesthesiology

## 2017-03-28 ENCOUNTER — Telehealth: Payer: Self-pay

## 2017-03-28 ENCOUNTER — Other Ambulatory Visit: Payer: Self-pay

## 2017-03-28 ENCOUNTER — Encounter (HOSPITAL_COMMUNITY): Payer: Self-pay

## 2017-03-28 ENCOUNTER — Encounter (HOSPITAL_COMMUNITY): Payer: Self-pay | Admitting: *Deleted

## 2017-03-28 ENCOUNTER — Encounter (HOSPITAL_COMMUNITY)
Admission: RE | Disposition: A | Discharge status: Home / Self Care | Payer: Self-pay | Source: Ambulatory Visit | Attending: Gastroenterology

## 2017-03-28 DIAGNOSIS — K648 Other hemorrhoids: Secondary | ICD-10-CM | POA: Diagnosis not present

## 2017-03-28 DIAGNOSIS — K644 Residual hemorrhoidal skin tags: Secondary | ICD-10-CM | POA: Diagnosis not present

## 2017-03-28 DIAGNOSIS — I10 Essential (primary) hypertension: Secondary | ICD-10-CM | POA: Insufficient documentation

## 2017-03-28 DIAGNOSIS — Z96651 Presence of right artificial knee joint: Secondary | ICD-10-CM | POA: Diagnosis not present

## 2017-03-28 DIAGNOSIS — E7800 Pure hypercholesterolemia, unspecified: Secondary | ICD-10-CM | POA: Diagnosis not present

## 2017-03-28 DIAGNOSIS — M1990 Unspecified osteoarthritis, unspecified site: Secondary | ICD-10-CM | POA: Diagnosis not present

## 2017-03-28 DIAGNOSIS — R112 Nausea with vomiting, unspecified: Secondary | ICD-10-CM | POA: Diagnosis not present

## 2017-03-28 DIAGNOSIS — Z1211 Encounter for screening for malignant neoplasm of colon: Secondary | ICD-10-CM | POA: Diagnosis not present

## 2017-03-28 DIAGNOSIS — Z9889 Other specified postprocedural states: Secondary | ICD-10-CM | POA: Insufficient documentation

## 2017-03-28 DIAGNOSIS — R1013 Epigastric pain: Secondary | ICD-10-CM | POA: Diagnosis present

## 2017-03-28 DIAGNOSIS — F419 Anxiety disorder, unspecified: Secondary | ICD-10-CM | POA: Insufficient documentation

## 2017-03-28 DIAGNOSIS — D124 Benign neoplasm of descending colon: Secondary | ICD-10-CM | POA: Insufficient documentation

## 2017-03-28 DIAGNOSIS — K2950 Unspecified chronic gastritis without bleeding: Secondary | ICD-10-CM | POA: Insufficient documentation

## 2017-03-28 DIAGNOSIS — Q438 Other specified congenital malformations of intestine: Secondary | ICD-10-CM | POA: Diagnosis not present

## 2017-03-28 DIAGNOSIS — K5730 Diverticulosis of large intestine without perforation or abscess without bleeding: Secondary | ICD-10-CM | POA: Diagnosis not present

## 2017-03-28 DIAGNOSIS — F329 Major depressive disorder, single episode, unspecified: Secondary | ICD-10-CM | POA: Insufficient documentation

## 2017-03-28 DIAGNOSIS — K317 Polyp of stomach and duodenum: Secondary | ICD-10-CM | POA: Insufficient documentation

## 2017-03-28 DIAGNOSIS — Z79899 Other long term (current) drug therapy: Secondary | ICD-10-CM | POA: Insufficient documentation

## 2017-03-28 DIAGNOSIS — Z836 Family history of other diseases of the respiratory system: Secondary | ICD-10-CM | POA: Diagnosis not present

## 2017-03-28 DIAGNOSIS — K2930 Chronic superficial gastritis without bleeding: Secondary | ICD-10-CM

## 2017-03-28 DIAGNOSIS — R7303 Prediabetes: Secondary | ICD-10-CM | POA: Diagnosis not present

## 2017-03-28 DIAGNOSIS — K219 Gastro-esophageal reflux disease without esophagitis: Secondary | ICD-10-CM | POA: Diagnosis not present

## 2017-03-28 DIAGNOSIS — Z7951 Long term (current) use of inhaled steroids: Secondary | ICD-10-CM | POA: Insufficient documentation

## 2017-03-28 DIAGNOSIS — K635 Polyp of colon: Secondary | ICD-10-CM

## 2017-03-28 DIAGNOSIS — Z01818 Encounter for other preprocedural examination: Secondary | ICD-10-CM | POA: Diagnosis not present

## 2017-03-28 HISTORY — PX: BIOPSY: SHX5522

## 2017-03-28 HISTORY — PX: ESOPHAGOGASTRODUODENOSCOPY (EGD) WITH PROPOFOL: SHX5813

## 2017-03-28 HISTORY — DX: Prediabetes: R73.03

## 2017-03-28 HISTORY — PX: POLYPECTOMY: SHX5525

## 2017-03-28 HISTORY — PX: COLONOSCOPY WITH PROPOFOL: SHX5780

## 2017-03-28 LAB — CBC WITH DIFFERENTIAL/PLATELET
Basophils Absolute: 0 10*3/uL (ref 0.0–0.1)
Basophils Relative: 0 %
EOS PCT: 2 %
Eosinophils Absolute: 0.1 10*3/uL (ref 0.0–0.7)
HEMATOCRIT: 46.4 % (ref 39.0–52.0)
Hemoglobin: 16.1 g/dL (ref 13.0–17.0)
LYMPHS PCT: 43 %
Lymphs Abs: 2.1 10*3/uL (ref 0.7–4.0)
MCH: 30.6 pg (ref 26.0–34.0)
MCHC: 34.7 g/dL (ref 30.0–36.0)
MCV: 88.2 fL (ref 78.0–100.0)
Monocytes Absolute: 0.3 10*3/uL (ref 0.1–1.0)
Monocytes Relative: 6 %
NEUTROS ABS: 2.4 10*3/uL (ref 1.7–7.7)
Neutrophils Relative %: 49 %
PLATELETS: 192 10*3/uL (ref 150–400)
RBC: 5.26 MIL/uL (ref 4.22–5.81)
RDW: 13.3 % (ref 11.5–15.5)
WBC: 4.8 10*3/uL (ref 4.0–10.5)

## 2017-03-28 LAB — BASIC METABOLIC PANEL
Anion gap: 12 (ref 5–15)
BUN: 13 mg/dL (ref 6–20)
CHLORIDE: 99 mmol/L — AB (ref 101–111)
CO2: 25 mmol/L (ref 22–32)
Calcium: 9.5 mg/dL (ref 8.9–10.3)
Creatinine, Ser: 0.86 mg/dL (ref 0.61–1.24)
GFR calc Af Amer: >60 mL/min (ref >60)
GLUCOSE: 106 mg/dL — AB (ref 65–99)
POTASSIUM: 3.4 mmol/L — AB (ref 3.5–5.1)
Sodium: 136 mmol/L (ref 135–145)

## 2017-03-28 SURGERY — COLONOSCOPY WITH PROPOFOL
Anesthesia: Monitor Anesthesia Care

## 2017-03-28 MED ORDER — LIDOCAINE HCL (PF) 1 % IJ SOLN
INTRAMUSCULAR | Status: AC
  Filled 2017-03-28: qty 5

## 2017-03-28 MED ORDER — MIDAZOLAM HCL 2 MG/2ML IJ SOLN
1.0000 mg | INTRAMUSCULAR | Status: AC
  Administered 2017-03-28: 2 mg via INTRAVENOUS

## 2017-03-28 MED ORDER — FENTANYL CITRATE (PF) 100 MCG/2ML IJ SOLN
INTRAMUSCULAR | Status: AC
  Filled 2017-03-28: qty 2

## 2017-03-28 MED ORDER — PHENYLEPHRINE 40 MCG/ML (10ML) SYRINGE FOR IV PUSH (FOR BLOOD PRESSURE SUPPORT)
PREFILLED_SYRINGE | INTRAVENOUS | Status: AC
  Filled 2017-03-28: qty 10

## 2017-03-28 MED ORDER — FENTANYL CITRATE (PF) 100 MCG/2ML IJ SOLN
25.0000 ug | Freq: Once | INTRAMUSCULAR | Status: AC
  Administered 2017-03-28: 25 ug via INTRAVENOUS

## 2017-03-28 MED ORDER — PROPOFOL 10 MG/ML IV BOLUS
INTRAVENOUS | Status: AC
  Filled 2017-03-28: qty 40

## 2017-03-28 MED ORDER — SODIUM CHLORIDE 0.9 % IJ SOLN
INTRAMUSCULAR | Status: AC
  Filled 2017-03-28: qty 10

## 2017-03-28 MED ORDER — STERILE WATER FOR IRRIGATION IR SOLN
Status: DC | PRN
  Administered 2017-03-28: 200 mL

## 2017-03-28 MED ORDER — LIDOCAINE VISCOUS 2 % MT SOLN
15.0000 mL | Freq: Once | OROMUCOSAL | Status: AC
  Administered 2017-03-28: 6 mL via OROMUCOSAL

## 2017-03-28 MED ORDER — MINERAL OIL PO OIL
TOPICAL_OIL | ORAL | Status: AC
  Filled 2017-03-28: qty 30

## 2017-03-28 MED ORDER — MIDAZOLAM HCL 2 MG/2ML IJ SOLN
INTRAMUSCULAR | Status: AC
  Filled 2017-03-28: qty 2

## 2017-03-28 MED ORDER — PROPOFOL 500 MG/50ML IV EMUL
INTRAVENOUS | Status: DC | PRN
  Administered 2017-03-28: 13:00:00 via INTRAVENOUS
  Administered 2017-03-28: 150 ug/kg/min via INTRAVENOUS

## 2017-03-28 MED ORDER — LACTATED RINGERS IV SOLN
INTRAVENOUS | Status: DC
  Administered 2017-03-28: 12:00:00 via INTRAVENOUS

## 2017-03-28 MED ORDER — PROPOFOL 10 MG/ML IV BOLUS
INTRAVENOUS | Status: DC | PRN
  Administered 2017-03-28: 20 mg via INTRAVENOUS

## 2017-03-28 MED ORDER — LIDOCAINE VISCOUS 2 % MT SOLN
OROMUCOSAL | Status: AC
  Filled 2017-03-28: qty 15

## 2017-03-28 NOTE — Discharge Instructions (Signed)
Usted tiene hemorroides internas, y HAD 1 plipo eliminado. Su nusea y vmito es ms probable que sea debido al reflejo y la grastritis. USTED HA RETIRADO UN POLP DEL ESTMAGO QUE APARECE UN BENIGNO. Hice una biopsia de tu estomago. [name] Harper. CONTINE SUS ESFUERZOS DE [name] Harper. MIENTRAS NO QUIERO ALARMARLE, SU NDICE DE MASA CORPORAL ES MS DE 30, LO QUE SIGNIFICA QUE ES OBESO. LA OBESIDAD ES ASOCIADA A UN RIESGO INCREMENTADO DE REFLUJO NO CONTROLADO, CIRRHOSIS Y TODOS LOS CNCERES, [name] Harper. UN PESO DE 173 LB OBTENER SU NDICE DE MASA CORPORAL ([name] Harper) MENOS DE 30. [name] Harper. SIGA UNA ALTA FIBRA / DIETA BAJA EN GRASA. EVITE LOS ARTCULOS QUE CAUSAN FLORECIENTES. VER INFO ABAJO. CONTINUAR DEXILANTE. SUS RESULTADOS DE BIOPSIA ESTARN DISPONIBLES EN MI CARTA DESPUS DEL 9 DE FEBRERO Y MI OFICINA LO CONTACTAR EN 10-14 DAS CON SUS RESULTADOS. SEGUIR EN [place] [name] Harper.    ENDOSCOPY Care After Read the instructions outlined below and refer to this sheet in the next week. These discharge instructions provide you with general information on caring for yourself after you leave the hospital. While your treatment has been planned according to the most current medical practices available, unavoidable complications occasionally occur. If you have any problems or questions after discharge, call DR. Arayla Harper, [phone].  ACTIVITY  You may resume your regular activity, but move at a slower pace for the next 24 hours.   Take frequent rest periods for the next 24 hours.   Walking will help get rid of the air and reduce the bloated feeling in your belly (abdomen).   No driving for 24 hours (because of the medicine (anesthesia) used during the test).   You may shower.   Do not sign any important legal documents or operate any machinery for 24 hours (because of the anesthesia used during the test).     NUTRITION  Drink plenty of fluids.   You may resume your normal diet as instructed by your doctor.   Begin with a light meal and progress to your normal diet. Heavy or fried foods are harder to digest and may make you feel sick to your stomach (nauseated).   Avoid alcoholic beverages for 24 hours or as instructed.    MEDICATIONS  You may resume your normal medications.   WHAT YOU CAN EXPECT TODAY  Some feelings of bloating in the abdomen.   Passage of more gas than usual.   Spotting of blood in your stool or on the toilet paper  .  IF YOU HAD POLYPS REMOVED DURING THE ENDOSCOPY:  Eat a soft diet IF YOU HAVE NAUSEA, BLOATING, ABDOMINAL PAIN, OR VOMITING.    FINDING OUT THE RESULTS OF YOUR TEST Not all test results are available during your visit. DR. Oneida Harper WILL CALL YOU WITHIN 14 DAYS OF YOUR PROCEDUE WITH YOUR RESULTS. Do not assume everything is normal if you have not heard from DR. Julian Harper, CALL HER OFFICE AT [phone].  SEEK IMMEDIATE MEDICAL ATTENTION AND CALL THE OFFICE: [phone] IF:  You have more than a spotting of blood in your stool.   Your belly is swollen (abdominal distention).   You are nauseated or vomiting.   You have a temperature over 101F.   You have abdominal pain or discomfort that is severe or gets worse throughout the day.   [name] Harper /  REFLUJO Puede eliminar o reducir la frecuencia de la Merchant navy officer realizando los siguientes cambios en el estilo de [name]: Controla tu peso. Tener sobrepeso es un factor de riesgo importante para la acidez estomacal y [place]. El exceso de peso ejerce presin sobre su abdomen, elevando su estmago y causando que el cido regrese a su esfago. [place]er comidas ms pequeas. 4 a 6 comidas al da. Esto reduce la presin en el esfnter esofgico inferior, lo que ayuda a evitar que la vlvula se abra y el cido vuelva a Dietitian en su esfago. Afloje su cinturn. La ropa  que se ajusta bien alrededor de su cintura ejerce presin sobre su abdomen y [employer] esfnter esofgico inferior. Eliminar los desencadenantes de la acidez estomacal. Todos tenemos desencadenantes especficos. Los desencadenantes comunes, como los alimentos grasos o fritos, los alimentos picantes, la salsa de [place], las bebidas gaseosas, el alcohol, el chocolate, la menta, el ajo, la [place], la cafena y la nicotina pueden empeorar la [place]. Evite inclinarse o doblarse. Atar tus zapatos est bien. Agacharse por perodos ms largos para desyerbar su jardn, especialmente despus de comer. No se acueste despus de [employer]. Espere al menos tres o cuatro horas despus de comer antes de irse a la cama, y ??no se acueste inmediatamente despus de comer. Medicina alternativa Existen varios remedios caseros para tratar la ERGE, pero solo proporcionan alivio temporal. Incluyen beber bicarbonato de sodio (bicarbonato de sodio) agregado al agua o beber otros lquidos como el bicarbonato de sodio mezclado con crema de sarro y Chartered loss adjuster. Aunque estos lquidos crean un alivio temporal al [employer], lavar o amortiguar los cidos, eventualmente agravan la situacin al agregar gas y lquido a su estmago, aumentando la presin y causando ms reflujo cido. Adems, agregar ms sodio a su dieta puede aumentar su presin arterial y agregar estrs al corazn, y la ingesta excesiva de bicarbonato puede alterar el equilibrio cido-base       Polyps, Colon  A polyp is extra tissue that grows inside your body. Colon polyps grow in the large intestine. The large intestine, also called the colon, is part of your digestive system. It is a long, hollow tube at the end of your digestive tract where your body makes and stores stool. Most polyps are not dangerous. They are benign. This means they are not cancerous. But over time, some types of polyps can turn into cancer. Polyps that are smaller than a pea are usually not harmful. But  larger polyps could someday become or may already be cancerous. To be safe, doctors remove all polyps and test them.   WHO GETS POLYPS? Anyone can get polyps, but certain people are more likely than others. You may have a greater chance of getting polyps if:  You are over 50.   You have had polyps before.   Someone in your family has had polyps.   Someone in your family has had cancer of the large intestine.   Find out if someone in your family has had polyps. You may also be more likely to get polyps if you:   Eat a lot of fatty foods   Smoke   Drink alcohol   Do not exercise  Eat too much   PREVENCIN  No hay una manera segura de prevenir los plipos. Es posible que pueda reducir su riesgo de contraerlos si: Coma ms frutas y verduras y menos alimentos grasos. No fume. Evite el alcohol. [place]. Pierde peso si tienes sobrepeso. [place]  ms calcio y cido flico tambin puede reducir su riesgo de contraer plipos. Algunos alimentos que son ricos en calcio son la [place], el queso y el brcoli. Algunos alimentos que son ricos en folato son los garbanzos, los frijoles y la    No hay una manera segura de prevenir los plipos. Es posible que pueda reducir su riesgo de contraerlos si: Coma ms frutas y verduras y menos alimentos grasos. No fume. Evite el alcohol. [place]. Pierde peso si tienes sobrepeso. [place] ms calcio y cido flico tambin puede reducir su riesgo de contraer plipos. Algunos alimentos que son ricos en calcio son la [place], el queso y el brcoli. Algunos alimentos que son ricos en folato son los garbanzos, los frijoles y la      Gastritis  Gastritis is an inflammation (the body's way of reacting to injury and/or infection) of the stomach. It is often caused by viral or bacterial (germ) infections. It can also be caused BY ASPIRIN, BC/GOODY POWDER'S, (IBUPROFEN) MOTRIN, OR ALEVE (NAPROXEN), chemicals (including alcohol), SPICY FOODS, and  medications. This illness may be associated with generalized malaise (feeling tired, not well), UPPER ABDOMINAL STOMACH cramps, and fever. One common bacterial cause of gastritis is an organism known as H. Pylori. This can be treated with antibiotics.    High-Fiber Diet A high-fiber diet changes your normal diet to include more whole grains, legumes, fruits, and vegetables. Changes in the diet involve replacing refined carbohydrates with unrefined foods. The calorie level of the diet is essentially unchanged. The Dietary Reference Intake (recommended amount) for adult males is 38 grams per day. For adult females, it is 25 grams per day. Pregnant and lactating women should consume 28 grams of fiber per day. Fiber is the intact part of a plant that is not broken down during digestion. Functional fiber is fiber that has been isolated from the plant to provide a beneficial effect in the body. PURPOSE  Increase stool bulk.   Ease and regulate bowel movements.   Lower cholesterol.   REDUCE RISK OF COLON CANCER  INDICATIONS THAT YOU NEED MORE FIBER  Constipation and hemorrhoids.   Uncomplicated diverticulosis (intestine condition) and irritable bowel syndrome.   Weight management.   As a protective measure against hardening of the arteries (atherosclerosis), diabetes, and cancer.   GUIDELINES FOR INCREASING FIBER IN THE DIET  Start adding fiber to the diet slowly. A gradual increase of about 5 more grams (2 slices of whole-wheat bread, 2 servings of most fruits or vegetables, or 1 bowl of high-fiber cereal) per day is best. Too rapid an increase in fiber may result in constipation, flatulence, and bloating.   Drink enough water and fluids to keep your urine clear or pale yellow. Water, juice, or caffeine-free drinks are recommended. Not drinking enough fluid may cause constipation.   Eat a variety of high-fiber foods rather than one type of fiber.   Try to increase your intake of fiber  through using high-fiber foods rather than fiber pills or supplements that contain small amounts of fiber.   The goal is to change the types of food eaten. Do not supplement your present diet with high-fiber foods, but replace foods in your present diet.   INCLUDE A VARIETY OF FIBER SOURCES  Replace refined and processed grains with whole grains, canned fruits with fresh fruits, and incorporate other fiber sources. White rice, white breads, and most bakery goods contain little or no fiber.   Brown whole-grain rice, buckwheat oats, and  many fruits and vegetables are all good sources of fiber. These include: broccoli, Brussels sprouts, cabbage, cauliflower, beets, sweet potatoes, white potatoes (skin on), carrots, tomatoes, eggplant, squash, berries, fresh fruits, and dried fruits.   Cereals appear to be the richest source of fiber. Cereal fiber is found in whole grains and bran. Bran is the fiber-rich outer coat of cereal grain, which is largely removed in refining. In whole-grain cereals, the bran remains. In breakfast cereals, the largest amount of fiber is found in those with "bran" in their names. The fiber content is sometimes indicated on the label.   You may need to include additional fruits and vegetables each day.   In baking, for 1 cup white flour, you may use the following substitutions:   1 cup whole-wheat flour minus 2 tablespoons.   1/2 cup white flour plus 1/2 cup whole-wheat flour.   Low-Fat Diet BREADS, CEREALS, PASTA, RICE, DRIED PEAS, AND BEANS These products are high in carbohydrates and most are low in fat. Therefore, they can be increased in the diet as substitutes for fatty foods. They too, however, contain calories and should not be eaten in excess. Cereals can be eaten for snacks as well as for breakfast.  Include foods that contain fiber (fruits, vegetables, whole grains, and legumes). Research shows that fiber may lower blood cholesterol levels, especially the  water-soluble fiber found in fruits, vegetables, oat products, and legumes. FRUITS AND VEGETABLES It is good to eat fruits and vegetables. Besides being sources of fiber, both are rich in vitamins and some minerals. They help you get the daily allowances of these nutrients. Fruits and vegetables can be used for snacks and desserts. MEATS Limit lean meat, chicken, [place], and fish to no more than 6 ounces per day. Beef, Pork, and Lamb Use lean cuts of beef, pork, and lamb. Lean cuts include:  Extra-lean ground beef.  Arm roast.  Sirloin tip.  Center-cut ham.  Round steak.  Loin chops.  Rump roast.  Tenderloin.  Trim all fat off the outside of meats before cooking. It is not necessary to severely decrease the intake of red meat, but lean choices should be made. Lean meat is rich in protein and contains a highly absorbable form of iron. Premenopausal women, in particular, should avoid reducing lean red meat because this could increase the risk for low red blood cells (iron-deficiency anemia). The organ meats, such as liver, sweetbreads, kidneys, and brain are very rich in cholesterol. They should be limited. Chicken and [place] These are good sources of protein. The fat of poultry can be reduced by removing the skin and underlying fat layers before cooking. Chicken and [place] can be substituted for lean red meat in the diet. Poultry should not be fried or covered with high-fat sauces. Fish and Shellfish Fish is a good source of protein. Shellfish contain cholesterol, but they usually are low in saturated fatty acids. The preparation of fish is important. Like chicken and [place], they should not be fried or covered with high-fat sauces. EGGS Egg whites contain no fat or cholesterol. They can be eaten often. Try 1 to 2 egg whites instead of whole eggs in recipes or use egg substitutes that do not contain yolk. MILK AND DAIRY PRODUCTS Use skim or 1% milk instead of 2% or whole milk. Decrease whole  milk, natural, and processed cheeses. Use nonfat or low-fat (2%) cottage cheese or low-fat cheeses made from vegetable oils. Choose nonfat or low-fat (1 to 2%) yogurt. Experiment with evaporated  skim milk in recipes that call for heavy cream. Substitute low-fat yogurt or low-fat cottage cheese for sour cream in dips and salad dressings. Have at least 2 servings of low-fat dairy products, such as 2 glasses of skim (or 1%) milk each day to help get your daily calcium intake.  FATS AND OILS Reduce the total intake of fats, especially saturated fat. Butterfat, lard, and beef fats are high in saturated fat and cholesterol. These should be avoided as much as possible. Vegetable fats do not contain cholesterol, but certain vegetable fats, such as coconut oil, palm oil, and palm kernel oil are very high in saturated fats. These should be limited. These fats are often used in bakery goods, processed foods, popcorn, oils, and nondairy creamers. Vegetable shortenings and some peanut butters contain hydrogenated oils, which are also saturated fats. Read the labels on these foods and check for saturated vegetable oils. Unsaturated vegetable oils and fats do not raise blood cholesterol. However, they should be limited because they are fats and are high in calories. Total fat should still be limited to 30% of your daily caloric intake. Desirable liquid vegetable oils are corn oil, cottonseed oil, olive oil, canola oil, safflower oil, soybean oil, and sunflower oil. Peanut oil is not as good, but small amounts are acceptable. Buy a heart-healthy tub margarine that has no partially hydrogenated oils in the ingredients. Mayonnaise and salad dressings often are made from unsaturated fats, but they should also be limited because of their high calorie and fat content. Seeds, nuts, peanut butter, olives, and avocados are high in fat, but the fat is mainly the unsaturated type. These foods should be limited mainly to avoid excess  calories and fat. OTHER EATING TIPS Snacks  Most sweets should be limited as snacks. They tend to be rich in calories and fats, and their caloric content outweighs their nutritional value. Some good choices in snacks are graham crackers, melba toast, soda crackers, bagels (no egg), English muffins, fruits, and vegetables. These snacks are preferable to snack crackers, [place] fries, and chips. Popcorn should be air-popped or cooked in small amounts of liquid vegetable oil. Desserts Eat fruit, low-fat yogurt, and fruit ices. AVOID pastries, cake, and cookies. Sherbet, angel food cake, gelatin dessert, frozen low-fat yogurt, or other frozen products that do not contain saturated fat (pure fruit juice bars, frozen ice pops) are also acceptable.  COOKING METHODS Choose those methods that use little or no fat. They include: Poaching.  Braising.  Steaming.  Grilling.  Baking.  Stir-frying.  Broiling.  Microwaving.  Foods can be cooked in a nonstick pan without added fat, or use a nonfat cooking spray in regular cookware. Limit fried foods and avoid frying in saturated fat. Add moisture to lean meats by using water, broth, cooking wines, and other nonfat or low-fat sauces along with the cooking methods mentioned above. Soups and stews should be chilled after cooking. The fat that forms on top after a few hours in the refrigerator should be skimmed off. When preparing meals, avoid using excess salt. Salt can contribute to raising blood pressure in some people. EATING AWAY FROM HOME Order entres, potatoes, and vegetables without sauces or butter. When meat exceeds the size of a deck of cards (3 to 4 ounces), the rest can be taken home for another meal. Choose vegetable or fruit salads and ask for low-calorie salad dressings to be served on the side. Use dressings sparingly. Limit high-fat toppings, such as bacon, crumbled eggs, cheese, sunflower seeds,  and olives. Ask for heart-healthy tub margarine  instead of butter.

## 2017-03-28 NOTE — Op Note (Signed)
[facility] Patient Name: [name] Harper Procedure Date: 03/28/2017 1:17 PM MRN: [id number] Date of Birth: 1960-04-17 Attending MD: Barney Drain MD, MD CSN: [id number] Age: 57 Admit Type: Outpatient Procedure:                Upper GI endoscopy with COLD FORCEPS BIOPSY Indications:              Dyspepsia, Nausea with vomiting Providers:                Barney Drain MD, MD, Rosina Lowenstein, RN, Aram Candela Referring MD:             Manon Hilding MD, MD Medicines:                Propofol per Anesthesia Complications:            No immediate complications. Estimated Blood Loss:     Estimated blood loss was minimal. Procedure:                Pre-Anesthesia Assessment:                           - Prior to the procedure, a History and Physical                            was performed, and patient medications and                            allergies were reviewed. The patient's tolerance of                            previous anesthesia was also reviewed. The risks                            and benefits of the procedure and the sedation                            options and risks were discussed with the patient.                            All questions were answered, and informed consent                            was obtained. Prior Anticoagulants: The patient has                            taken no previous anticoagulant or antiplatelet                            agents. ASA Grade Assessment: II - A patient with                            mild systemic disease. After reviewing the risks                            and benefits, the patient was deemed in  satisfactory condition to undergo the procedure.                            After obtaining informed consent, the endoscope was                            passed under direct vision. Throughout the                            procedure, the patient's blood pressure, pulse, and                            oxygen  saturations were monitored continuously. The                            [id number] ([id number]) scope was introduced through the                            mouth, and advanced to the second part of duodenum.                            The upper GI endoscopy was accomplished without                            difficulty. The patient tolerated the procedure                            well. Scope In: 1:21:32 PM Scope Out: 1:31:45 PM Total Procedure Duration: 0 hours 10 minutes 13 seconds  Findings:      The examined esophagus was normal.      Patchy mild inflammation characterized by congestion (edema) and       erythema was found in the gastric antrum. Biopsies were taken with a       cold forceps for Helicobacter pylori testing.      The examined duodenum was normal. Impression:               - NAUSEA AND VOMITING MOST LIKELY DUE TO                            Gastritis/GERD. Moderate Sedation:      Per Anesthesia Care Recommendation:           - Await pathology results.                           - High fiber diet and low fat diet.                           - Continue present medications.                           - Return to GI office in 4 months.                           - Patient has a contact number available for  emergencies. The signs and symptoms of potential                            delayed complications were discussed with the                            patient. Return to normal activities tomorrow.                            Written discharge instructions were provided to the                            patient. Procedure Code(s):        --- Professional ---                           [phone], Esophagogastroduodenoscopy, flexible,                            transoral; with biopsy, single or multiple Diagnosis Code(s):        --- Professional ---                           K29.70, Gastritis, unspecified, without bleeding                           R10.13,  Epigastric pain                           R11.2, Nausea with vomiting, unspecified CPT copyright 2016 American Medical Association. All rights reserved. The codes documented in this report are preliminary and upon coder review may  be revised to meet current compliance requirements. Barney Drain, MD Barney Drain MD, MD 03/28/2017 1:47:28 PM This report has been signed electronically. Number of Addenda: 0

## 2017-03-28 NOTE — Telephone Encounter (Signed)
noted 

## 2017-03-28 NOTE — Anesthesia Postprocedure Evaluation (Signed)
Anesthesia Post Note  Patient: [name] Harper  Procedure(s) Performed: COLONOSCOPY WITH PROPOFOL (N/A ) ESOPHAGOGASTRODUODENOSCOPY (EGD) WITH PROPOFOL (N/A ) POLYPECTOMY BIOPSY  Patient location during evaluation: PACU Anesthesia Type: MAC Level of consciousness: awake and alert and oriented Pain management: pain level controlled Vital Signs Assessment: post-procedure vital signs reviewed and stable Respiratory status: spontaneous breathing Cardiovascular status: blood pressure returned to baseline and stable Postop Assessment: no apparent nausea or vomiting Anesthetic complications: no     Last Vitals:  Vitals:   03/28/17 1240 03/28/17 1245  BP: 115/82 (!) 134/96  Pulse:    Resp: 19 (!) 21  Temp:    SpO2: 97% 97%    Last Pain:  Vitals:   03/28/17 1126  TempSrc: Oral                 Luanne Krzyzanowski

## 2017-03-28 NOTE — H&P (Signed)
Primary Care Physician:  Manon Hilding, MD Primary Gastroenterologist:  Dr. Gala Romney.  Pre-Procedure History & Physical: HPI:  [name] Harper is a 57 y.o. male here for dyspepsia & screening. PT PREPPED ON THE WRONG DAY.  Past Medical History:  Diagnosis Date  . Anxiety   . Arthritis   . Bronchitis   . Complication of anesthesia   . Depression   . Hypercholesterolemia   . Hypertension   . PONV (postoperative nausea and vomiting)   . Pre-diabetes    pt stated    Past Surgical History:  Procedure Laterality Date  . ABDOMINAL SURGERY  2007   hiatal hernia repair  . KNEE ARTHROSCOPY  2016   rt knee  . TOTAL KNEE ARTHROPLASTY Right 10/29/2014   Procedure: RIGHT TOTAL KNEE ARTHROPLASTY;  Surgeon: Latanya Maudlin, MD;  Location: [facility];  Service: Orthopedics;  Laterality: Right;    Prior to Admission medications   Medication Sig Start Date End Date Taking? Authorizing Provider  Azelastine HCl 137 MCG/SPRAY SOLN Place 2 sprays into both nostrils 2 (two) times daily.  02/15/17  Yes [provider]  citalopram (CELEXA) 40 MG tablet Take 40 mg by mouth daily.   Yes [provider]  clonazePAM (KLONOPIN) 0.5 MG tablet Take 0.5 mg by mouth 3 (three) times daily as needed for anxiety. anxiety 02/10/14  Yes [provider]  dexlansoprazole (DEXILANT) 60 MG capsule Take 1 capsule (60 mg total) by mouth daily before breakfast. 03/02/17  Yes Mahala Menghini, PA-C  hydrochlorothiazide (HYDRODIURIL) 25 MG tablet Take 25 mg by mouth every morning.  11/25/13  Yes [provider]  mometasone (NASONEX) 50 MCG/ACT nasal spray USE 2 SPRAYS IN EACH NOSTRIL DAILY 11/29/16  Yes Valentina Shaggy, MD  simvastatin (ZOCOR) 20 MG tablet Take 20 mg by mouth at bedtime.  11/12/13  Yes [provider]  EPINEPHrine 0.3 mg/0.3 mL IJ SOAJ injection USE AS DIRECTED FOR A SEVERE ALLERGIC REACTION. Patient not taking: Reported on 03/20/2017 07/21/15   Gean Quint, MD     Allergies as of 03/02/2017  . (No Known Allergies)    Family History  Problem Relation Age of Onset  . Lung disease Mother   . Colon cancer Neg Hx     Social History   Socioeconomic History  . Marital status: Married    Spouse name: Not on file  . Number of children: Not on file  . Years of education: Not on file  . Highest education level: Not on file  Social Needs  . Financial resource strain: Not on file  . Food insecurity - worry: Not on file  . Food insecurity - inability: Not on file  . Transportation needs - medical: Not on file  . Transportation needs - non-medical: Not on file  Occupational History  . Not on file  Tobacco Use  . Smoking status: Never Smoker  . Smokeless tobacco: Never Used  Substance and Sexual Activity  . Alcohol use: No    Alcohol/week: 0.0 oz  . Drug use: No  . Sexual activity: Yes    Birth control/protection: None  Other Topics Concern  . Not on file  Social History Narrative  . Not on file    Review of Systems: See HPI, otherwise negative ROS   Physical Exam: Pulse 89   Temp 99.2 F (37.3 C) (Oral)   Resp (!) 22   Ht 5\' 4"  (1.626 m)   Wt 185 lb (83.9 kg)  BMI 31.76 kg/m  General:   Alert,  pleasant and cooperative in NAD Head:  Normocephalic and atraumatic. Neck:  Supple; Lungs:  Clear throughout to auscultation.    Heart:  Regular rate and rhythm. Abdomen:  Soft, nontender and nondistended. Normal bowel sounds, without guarding, and without rebound.   Neurologic:  Alert and  oriented x4;  grossly normal neurologically.  Impression/Plan:   DYSPEPSIA/screening  PLAN:  EGD/TCSTODAY DISCUSSED PROCEDURE, BENEFITS, & RISKS: < 1% chance of medication reaction, bleeding, perforation, or rupture of spleen/liver.

## 2017-03-28 NOTE — Anesthesia Preprocedure Evaluation (Signed)
Anesthesia Evaluation  Patient identified by MRN, date of birth, ID band Patient awake    Reviewed: Allergy & Precautions, NPO status , Patient's Chart, lab work & pertinent test results  History of Anesthesia Complications (+) PONV and history of anesthetic complications  Airway Mallampati: III  TM Distance: >3 FB Neck ROM: Full    Dental  (+) Teeth Intact   Pulmonary neg pulmonary ROS,    breath sounds clear to auscultation       Cardiovascular hypertension, Pt. on medications  Rhythm:Regular Rate:Normal     Neuro/Psych PSYCHIATRIC DISORDERS Anxiety Depression negative neurological ROS     GI/Hepatic negative GI ROS, Neg liver ROS, GERD  ,  Endo/Other  negative endocrine ROS  Renal/GU negative Renal ROS     Musculoskeletal  (+) Arthritis ,   Abdominal   Peds  Hematology negative hematology ROS (+)   Anesthesia Other Findings   Reproductive/Obstetrics                             Anesthesia Physical Anesthesia Plan  ASA: II  Anesthesia Plan: MAC   Post-op Pain Management:    Induction: Intravenous  PONV Risk Score and Plan:   Airway Management Planned: Simple Face Mask  Additional Equipment:   Intra-op Plan:   Post-operative Plan:   Informed Consent: I have reviewed the patients History and Physical, chart, labs and discussed the procedure including the risks, benefits and alternatives for the proposed anesthesia with the patient or authorized representative who has indicated his/her understanding and acceptance.     Plan Discussed with:   Anesthesia Plan Comments:         Anesthesia Quick Evaluation

## 2017-03-28 NOTE — Telephone Encounter (Signed)
Melanie at [facility] called office. Pt drank his TCS prep prior to his pre-op appt today. [facility] done his TCS/EGD this morning. (he was previously scheduled for TCS/EGD with [facility] 04/03/17).

## 2017-03-28 NOTE — Transfer of Care (Signed)
Immediate Anesthesia Transfer of Care Note  Patient: [name] Harper  Procedure(s) Performed: COLONOSCOPY WITH PROPOFOL (N/A ) ESOPHAGOGASTRODUODENOSCOPY (EGD) WITH PROPOFOL (N/A ) POLYPECTOMY BIOPSY  Patient Location: PACU  Anesthesia Type:MAC  Level of Consciousness: awake and alert   Airway & Oxygen Therapy: Patient Spontanous Breathing  Post-op Assessment: Report given to RN  Post vital signs: Reviewed and stable  Last Vitals:  Vitals:   03/28/17 1240 03/28/17 1245  BP: 115/82 (!) 134/96  Pulse:    Resp: 19 (!) 21  Temp:    SpO2: 97% 97%    Last Pain:  Vitals:   03/28/17 1126  TempSrc: Oral      Patients Stated Pain Goal: 6 (41/03/01 3143)  Complications: No apparent anesthesia complications

## 2017-03-28 NOTE — Op Note (Signed)
[facility] Patient Name: [name] Harper Procedure Date: 03/28/2017 12:26 PM MRN: [id number] Date of Birth: 1960/10/03 Attending MD: Barney Drain MD, MD CSN: [id number] Age: 57 Admit Type: Outpatient Procedure:                Colonoscopy WITH COLD SNARE POLYPECTOMY Indications:              Screening for colorectal malignant neoplasm Providers:                Barney Drain MD, MD, Rosina Lowenstein, RN, Aram Candela Referring MD:             Manon Hilding MD, MD Medicines:                Propofol per Anesthesia Complications:            No immediate complications. Estimated Blood Loss:     Estimated blood loss: none. Procedure:                Pre-Anesthesia Assessment:                           - Prior to the procedure, a History and Physical                            was performed, and patient medications and                            allergies were reviewed. The patient's tolerance of                            previous anesthesia was also reviewed. The risks                            and benefits of the procedure and the sedation                            options and risks were discussed with the patient.                            All questions were answered, and informed consent                            was obtained. Prior Anticoagulants: The patient has                            taken no previous anticoagulant or antiplatelet                            agents. ASA Grade Assessment: II - A patient with                            mild systemic disease. After reviewing the risks                            and benefits, the patient was deemed in  satisfactory condition to undergo the procedure.                            After obtaining informed consent, the colonoscope                            was passed under direct vision. Throughout the                            procedure, the patient's blood pressure, pulse, and                            oxygen  saturations were monitored continuously. The                            EC-[id number] ([id number]) scope was introduced through                            the anus and advanced to the the cecum, identified                            by appendiceal orifice and ileocecal valve. The                            colonoscopy was somewhat difficult due to a                            tortuous colon. Successful completion of the                            procedure was aided by straightening and shortening                            the scope to obtain bowel loop reduction and                            COLOWRAP. The ileocecal valve, appendiceal orifice,                            and rectum were photographed. The patient tolerated                            the procedure well. The quality of the bowel                            preparation was good. Scope In: 1:02:37 PM Scope Out: 1:16:21 PM Scope Withdrawal Time: 0 hours 10 minutes 46 seconds  Total Procedure Duration: 0 hours 13 minutes 44 seconds  Findings:      The recto-sigmoid colon and sigmoid colon were mildly redundant.      A few small and large-mouthed diverticula were found in the       recto-sigmoid colon, sigmoid colon and ascending colon.      External and internal hemorrhoids were found during retroflexion. The  hemorrhoids were moderate and small.      A 4 mm polyp was found in the descending colon. The polyp was sessile.       The polyp was removed with a cold snare. Resection and retrieval were       complete. Impression:               - Redundant LEFT colon.                           - Diverticulosis in the recto-sigmoid colon, in the                            sigmoid colon and in the ascending colon.                           - External and internal hemorrhoids.                           - ONE DESCENDING COLON POLYP REMOVED Moderate Sedation:      Per Anesthesia Care Recommendation:           - Repeat colonoscopy in 10  years for surveillance.                           - High fiber diet and low fat diet.                           - Continue present medications.                           - Return to GI office in 4 months.                           - Patient has a contact number available for                            emergencies. The signs and symptoms of potential                            delayed complications were discussed with the                            patient. Return to normal activities tomorrow.                            Written discharge instructions were provided to the                            patient. Procedure Code(s):        --- Professional ---                           [phone], Colonoscopy, flexible; with removal of                            tumor(s), polyp(s), or other lesion(s) by  snare                            technique Diagnosis Code(s):        --- Professional ---                           Z12.11, Encounter for screening for malignant                            neoplasm of colon                           K64.8, Other hemorrhoids                           K57.30, Diverticulosis of large intestine without                            perforation or abscess without bleeding                           Q43.8, Other specified congenital malformations of                            intestine CPT copyright 2016 American Medical Association. All rights reserved. The codes documented in this report are preliminary and upon coder review may  be revised to meet current compliance requirements. Barney Drain, MD Barney Drain MD, MD 03/28/2017 1:39:47 PM This report has been signed electronically. Number of Addenda: 0

## 2017-03-30 ENCOUNTER — Encounter (HOSPITAL_COMMUNITY): Payer: Self-pay | Admitting: Gastroenterology

## 2017-04-04 ENCOUNTER — Telehealth: Payer: Self-pay | Admitting: Gastroenterology

## 2017-04-04 NOTE — Telephone Encounter (Signed)
See result note, pt is aware.  

## 2017-04-04 NOTE — Progress Notes (Signed)
Called to tell pt, he wanted me to tell his wife, Arbie Cookey and she was informed.

## 2017-04-04 NOTE — Telephone Encounter (Signed)
Pt had procedure on 2/5 by SF. He was asking if the results were available. Please call [phone]

## 2017-06-08 DIAGNOSIS — R945 Abnormal results of liver function studies: Secondary | ICD-10-CM | POA: Diagnosis not present

## 2017-06-08 DIAGNOSIS — F411 Generalized anxiety disorder: Secondary | ICD-10-CM | POA: Diagnosis not present

## 2017-06-08 DIAGNOSIS — E7800 Pure hypercholesterolemia, unspecified: Secondary | ICD-10-CM | POA: Diagnosis not present

## 2017-06-08 DIAGNOSIS — E119 Type 2 diabetes mellitus without complications: Secondary | ICD-10-CM | POA: Diagnosis not present

## 2017-06-08 DIAGNOSIS — K210 Gastro-esophageal reflux disease with esophagitis: Secondary | ICD-10-CM | POA: Diagnosis not present

## 2017-06-08 DIAGNOSIS — F3341 Major depressive disorder, recurrent, in partial remission: Secondary | ICD-10-CM | POA: Diagnosis not present

## 2017-06-08 DIAGNOSIS — I10 Essential (primary) hypertension: Secondary | ICD-10-CM | POA: Diagnosis not present

## 2017-06-14 DIAGNOSIS — E291 Testicular hypofunction: Secondary | ICD-10-CM | POA: Diagnosis not present

## 2017-06-14 DIAGNOSIS — E119 Type 2 diabetes mellitus without complications: Secondary | ICD-10-CM | POA: Diagnosis not present

## 2017-06-14 DIAGNOSIS — R945 Abnormal results of liver function studies: Secondary | ICD-10-CM | POA: Diagnosis not present

## 2017-06-14 DIAGNOSIS — Z0001 Encounter for general adult medical examination with abnormal findings: Secondary | ICD-10-CM | POA: Diagnosis not present

## 2017-06-14 DIAGNOSIS — M25561 Pain in right knee: Secondary | ICD-10-CM | POA: Diagnosis not present

## 2017-06-14 DIAGNOSIS — R110 Nausea: Secondary | ICD-10-CM | POA: Diagnosis not present

## 2017-06-14 DIAGNOSIS — F411 Generalized anxiety disorder: Secondary | ICD-10-CM | POA: Diagnosis not present

## 2017-06-14 DIAGNOSIS — I10 Essential (primary) hypertension: Secondary | ICD-10-CM | POA: Diagnosis not present

## 2017-06-14 DIAGNOSIS — F3341 Major depressive disorder, recurrent, in partial remission: Secondary | ICD-10-CM | POA: Diagnosis not present

## 2017-06-14 DIAGNOSIS — Z6833 Body mass index (BMI) 33.0-33.9, adult: Secondary | ICD-10-CM | POA: Diagnosis not present

## 2017-06-14 DIAGNOSIS — F5221 Male erectile disorder: Secondary | ICD-10-CM | POA: Diagnosis not present

## 2017-06-14 DIAGNOSIS — S76219A Strain of adductor muscle, fascia and tendon of unspecified thigh, initial encounter: Secondary | ICD-10-CM | POA: Diagnosis not present

## 2017-06-23 DIAGNOSIS — H40033 Anatomical narrow angle, bilateral: Secondary | ICD-10-CM | POA: Diagnosis not present

## 2017-06-23 DIAGNOSIS — G44219 Episodic tension-type headache, not intractable: Secondary | ICD-10-CM | POA: Diagnosis not present

## 2017-07-25 ENCOUNTER — Other Ambulatory Visit: Payer: Self-pay | Admitting: *Deleted

## 2017-07-25 ENCOUNTER — Encounter: Payer: Self-pay | Admitting: *Deleted

## 2017-07-25 DIAGNOSIS — K219 Gastro-esophageal reflux disease without esophagitis: Secondary | ICD-10-CM

## 2017-07-26 ENCOUNTER — Other Ambulatory Visit: Payer: Self-pay | Admitting: Allergy & Immunology

## 2017-07-26 ENCOUNTER — Encounter: Payer: Self-pay | Admitting: Gastroenterology

## 2017-07-26 ENCOUNTER — Ambulatory Visit (INDEPENDENT_AMBULATORY_CARE_PROVIDER_SITE_OTHER): Payer: PPO | Admitting: Gastroenterology

## 2017-07-26 VITALS — BP 156/86 | HR 108 | Temp 97.0°F | Ht 64.0 in | Wt 180.8 lb

## 2017-07-26 DIAGNOSIS — K219 Gastro-esophageal reflux disease without esophagitis: Secondary | ICD-10-CM | POA: Diagnosis not present

## 2017-07-26 DIAGNOSIS — R7989 Other specified abnormal findings of blood chemistry: Secondary | ICD-10-CM

## 2017-07-26 DIAGNOSIS — R945 Abnormal results of liver function studies: Secondary | ICD-10-CM

## 2017-07-26 MED ORDER — PANTOPRAZOLE SODIUM 40 MG PO TBEC: 40.0000 mg | DELAYED_RELEASE_TABLET | Freq: Two times a day (BID) | ORAL | 5 refills | Status: DC

## 2017-07-26 NOTE — Progress Notes (Signed)
cc'd to pcp 

## 2017-07-26 NOTE — Progress Notes (Signed)
Primary Care Physician: Manon Hilding, MD  Primary Gastroenterologist:  Garfield Cornea, MD   Chief Complaint  Patient presents with  . Gastroesophageal Reflux    HPI: [name] Harper is a 57 y.o. male here for follow-up.  He was seen back in January after not being seen for more than 10 years.  He has a history of erosive reflux esophagitis, H. pylori treated remotely.  See office note from March 02, 2017 for details of previous remote work-up.  When I saw him back in January he was having issues with his allergies and sinuses really bad.  He was having a lot of coughing and phlegm particularly in the mornings, deep coughing, associate with nausea but no vomiting.  No typical heartburn.  Already treated by allergist, receiving allergy injections for 3 years, initial 2 years seem to help.  Also taking 2 different nasal sprays again initially helped quite a bit but lost their efficacy.  Back in January we started him back on PPI, Dexilant 60 mg daily.  He said initially for 2 months he did very well without cough or phlegm.  Gradually the symptoms started coming back.  About 4 weeks ago he stopped taking Dexilant altogether and noted worsening in his symptoms.  Still no typical heartburn.  He goes back to see his allergist next month.  Denies abdominal pain.  Bowel movements are regular.  Denies melena or rectal bleeding.  He had an EGD and colonoscopy by Dr. Oneida Alar as he presented the wrong day for his procedures and had already prepped.  He was found to have normal esophagus, mild gastritis but no H. pylori.  Single benign polyp removed from the descending colon, diverticulosis, hemorrhoids.  Next colonoscopy in 10 years.  Also has a history of mildly elevated AST/ALT with fatty liver on prior imaging.  He brings in labs today which showed that his AST and ALT have normalized.  His hemoglobin A1c is also down from 6.86.2.  He has been trying really hard to change his diet, eliminated all  carbonated beverages, avoiding greasy/fried foods.  Eating more fruits and vegetables.  He has lost 13 pounds.   Current Outpatient Medications  Medication Sig Dispense Refill  . Azelastine HCl 137 MCG/SPRAY SOLN Place 2 sprays into both nostrils 2 (two) times daily.     . citalopram (CELEXA) 40 MG tablet Take 40 mg by mouth daily.    . clonazePAM (KLONOPIN) 0.5 MG tablet Take 0.5 mg by mouth 3 (three) times daily as needed for anxiety. anxiety    . dexlansoprazole (DEXILANT) 60 MG capsule Take 1 capsule (60 mg total) by mouth daily before breakfast. 30 capsule 5  . EPINEPHrine 0.3 mg/0.3 mL IJ SOAJ injection USE AS DIRECTED FOR A SEVERE ALLERGIC REACTION. 2 Device 2  . hydrochlorothiazide (HYDRODIURIL) 25 MG tablet Take 25 mg by mouth every morning.     . mometasone (NASONEX) 50 MCG/ACT nasal spray USE 2 SPRAYS IN EACH NOSTRIL DAILY 17 g 5  . simvastatin (ZOCOR) 20 MG tablet Take 20 mg by mouth at bedtime.      No current facility-administered medications for this visit.    Facility-Administered Medications Ordered in Other Visits  Medication Dose Route Frequency Provider Last Rate Last Dose  . bupivacaine liposome (EXPAREL) 1.3 % injection 266 mg  20 mL Infiltration Once Cecilio Asper, Amber, PA-C        Allergies as of 07/26/2017  . (No Known Allergies)    ROS:  General: Negative for anorexia, unintentional weight loss, fever, chills, fatigue, weakness. ENT: Negative for hoarseness, difficulty swallowing , nasal congestion.  Positive runny nose, throat clearing, congestion and phlegm. CV: Negative for chest pain, angina, palpitations, dyspnea on exertion, peripheral edema.  Respiratory: Negative for dyspnea at rest, dyspnea on exertion, cough, sputum, wheezing.  GI: See history of present illness. GU:  Negative for dysuria, hematuria, urinary incontinence, urinary frequency, nocturnal urination.  Endo: Negative for unusual weight change.    Physical Examination:   BP (!) 156/86    Pulse (!) 108   Temp (!) 97 F (36.1 C) (Oral)   Ht 5\' 4"  (1.626 m)   Wt 180 lb 12.8 oz (82 kg)   BMI 31.03 kg/m   General: Well-nourished, well-developed in no acute distress.  Eyes: No icterus. Mouth: Oropharyngeal mucosa moist and pink , no lesions erythema or exudate. Lungs: Clear to auscultation bilaterally.  Heart: Regular rate and rhythm, no murmurs rubs or gallops.  Abdomen: Bowel sounds are normal, nontender, nondistended, no hepatosplenomegaly or masses, no abdominal bruits or hernia , no rebound or guarding.   Extremities: No lower extremity edema. No clubbing or deformities. Neuro: Alert and oriented x 4   Skin: Warm and dry, no jaundice.   Psych: Alert and cooperative, normal mood and affect.  Labs:  April 2019 labs to be scanned.  LFTs were normal.  Imaging Studies: No results found.

## 2017-07-26 NOTE — Assessment & Plan Note (Signed)
Likely due to fatty liver.  Improved with weight loss and dietary changes.  Continue to monitor every 6 months.  Return office visit with labs at that time.

## 2017-07-26 NOTE — Patient Instructions (Signed)
1. Stop Dexilant. Start pantoprazole. Take one tablet 30 minutes before breakfast and one tablet 30 minutes before your evening meal.  2. Discuss your cough/phelgm with your allergies when you see them. I am curious if some of your symptoms may be secondary to your allergies rather than reflux.  3. We will see you back in six months for reflux and for fatty liver.

## 2017-07-26 NOTE — Assessment & Plan Note (Signed)
Patient with a long history of GERD.  Well-documented in 2008 via pH probe study.  Also well-documented correlation with cough and reflux during that study.  Patient states he had antireflux surgery in 2008 in [place].  I do not have that record.  Did well up until when we saw him January 2019.  Denies typical heartburn.  Continues to complain of mostly early a.m. cough/phlegm.  Wakes up and has to cough until everything gets clear and then he is fine the rest of the day.  Also has significant allergies/sinusitis.  Brings a copy of his allergy testing done several years back and is allergic to multiple environmental agents.  Foods that were tested were unremarkable.  Took allergy shots for quite some time, on 2 nasal sprays.  Seems to be losing efficacy.  Has an appointment to see his allergist next month.  Symptoms may be multifactorial.  Interestingly he tells me that he did very well on Dexilant the first couple months but then his symptoms return.  Symptoms also got worse when he stopped Dexilant.  At this time we will start a new PPI, pantoprazole 40 mg twice daily, 30 minutes before breakfast and evening meal.  Reinforced antireflux measures.  He is doing a good job with weight loss as well.  He will call in a few weeks if he is not a lot better.  Otherwise we will see him back in 6 months.

## 2017-09-05 ENCOUNTER — Encounter: Payer: Self-pay | Admitting: Allergy & Immunology

## 2017-09-05 ENCOUNTER — Ambulatory Visit: Payer: PPO | Admitting: Allergy & Immunology

## 2017-09-05 VITALS — BP 120/80 | HR 78 | Resp 18

## 2017-09-05 DIAGNOSIS — J302 Other seasonal allergic rhinitis: Secondary | ICD-10-CM

## 2017-09-05 DIAGNOSIS — J3089 Other allergic rhinitis: Secondary | ICD-10-CM

## 2017-09-05 DIAGNOSIS — K219 Gastro-esophageal reflux disease without esophagitis: Secondary | ICD-10-CM | POA: Diagnosis not present

## 2017-09-05 LAB — HEPATIC FUNCTION PANEL
AG RATIO: 1.5 (calc) (ref 1.0–2.5)
ALKALINE PHOSPHATASE (APISO): 73 U/L (ref 40–115)
ALT: 32 U/L (ref 9–46)
AST: 24 U/L (ref 10–35)
Albumin: 4.3 g/dL (ref 3.6–5.1)
Bilirubin, Direct: 0.2 mg/dL (ref 0.0–0.2)
GLOBULIN: 2.9 g/dL (ref 1.9–3.7)
Indirect Bilirubin: 0.8 mg/dL (calc) (ref 0.2–1.2)
TOTAL PROTEIN: 7.2 g/dL (ref 6.1–8.1)
Total Bilirubin: 1 mg/dL (ref 0.2–1.2)

## 2017-09-05 NOTE — Patient Instructions (Addendum)
1. Allergic rhinoconjunctivitis - stopped allergy shots November 2018 - Continue mometasone nasal spray one spray per nostril daily. - Continue with azelastine one spray twice daily as needed. - Continue with Xyzal 5mg  daily as needed.   2. GERD    - I agree with the change to dexlansoprazole.  - I look forward to seeing the results of the endoscopy.   3. Return in about 1 year (around 09/06/2018).    Please inform us of any Emergency Department visits, hospitalizations, or changes in symptoms. Call us before going to the ED for breathing or allergy symptoms since we might be able to fit you in for a sick visit. Feel free to contact us anytime with any questions, problems, or concerns.  It was a pleasure to see you again today!  Websites that have reliable patient information: 1. American Academy of Asthma, Allergy, and Immunology: www.aaaai.org 2. Food Allergy Research and Education (FARE): foodallergy.org 3. Mothers of Asthmatics: http://www.asthmacommunitynetwork.org 4. American College of Allergy, Asthma, and Immunology: MonthlyElectricBill.co.uk   Make sure you are registered to vote! If you have moved or changed any of your contact information, you will need to get this updated before voting!

## 2017-09-05 NOTE — Progress Notes (Signed)
FOLLOW UP  Date of Service/Encounter:  09/05/17   Assessment:     Gastroesophageal reflux disease - on Protonix twice daily  Seasonal and perennial allergic rhinitis (grasses, weeds, trees, molds, DM, dog, cat, cockroach)  Plan/Recommendations:   1. Allergic rhinoconjunctivitis - stopped allergy shots November 2018 - Continue mometasone nasal spray one spray per nostril daily. - Continue with azelastine one spray twice daily as needed. - Continue with Xyzal 5mg  daily as needed.   2. GERD    - Continue with Protonix BID.  - Continue with follow up with Dr. Oneida Alar.   3. Return in about 1 year (around 09/06/2018).  Subjective:   [name] Harper is a 57 y.o. male presenting today for follow up of  Chief Complaint  Patient presents with  . Allergic Rhinitis   . Gastroesophageal Reflux    [name] Harper has a history of the following: Patient Active Problem List   Diagnosis Date Noted  . Chronic superficial gastritis without bleeding   . Polyp of descending colon   . Encounter for screening colonoscopy 03/02/2017  . Abnormal LFTs 03/02/2017  . Nausea without vomiting 03/02/2017  . Post-nasal drip 11/29/2016  . Perennial allergic rhinitis 11/29/2016  . Gastroesophageal reflux disease 11/29/2016  . 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  [name] Harper Primary Care Provider is Sasser, Silvestre Moment, MD.     [name] Harper is a 57 y.o. male presenting for a follow up visit. He was last seen in January 2019. At that time, we continued him on Nasonex one spray per nostril daily as well as azelastine one spray per nostril up to twice daily. We also continued him on Xyzal 5mg  daily. We had referred him to see Dr. Oneida Alar, and she changed him on dexlansoprazole. He made the decision to stop allergy shots in November 2018 since they seemed to be losing efficacy (maintenace x 4 years).   In the interim, he had an endoscopy performed  that showed a normal esophagus, mild gastritis but no H. pylori. There was a single benign polyp removed from the descending colon, diverticulosis, hemorrhoids. He has a remote history of mildly elevated LFTs which has since normalized. Hemoglobin A1c has decreased to 6.86 and he was working on losing weight. At his last visit with his GI provider, he was changed to Protonix 40mg  BID. His next visit with GI is December 2019.   Since the last visit, he has done very well. He feels that this is providing some more relief. His rhinitis symptoms are well controlled. He remains on the Nasonex as well as the azelastine (typically twice daily). He is not on the antihistamine at all. Overall he feels fairly good considering he has been off of his shots for more than six months. He is willing to restart if needed, but at this time he prefers to stay off of the shots.   Otherwise, there have been no changes to his past medical history, surgical history, family history, or social history.    Review of Systems: a 14-point review of systems is pertinent for what is mentioned in HPI.  Otherwise, all other systems were negative. Constitutional: negative other than that listed in the HPI Eyes: negative other than that listed in the HPI Ears, nose, mouth, throat, and face: negative other than that listed in the HPI Respiratory: negative other than that listed in the HPI Cardiovascular: negative other than that listed in the  HPI Gastrointestinal: negative other than that listed in the HPI Genitourinary: negative other than that listed in the HPI Integument: negative other than that listed in the HPI Hematologic: negative other than that listed in the HPI Musculoskeletal: negative other than that listed in the HPI Neurological: negative other than that listed in the HPI Allergy/Immunologic: negative other than that listed in the HPI    Objective:   Blood pressure 120/80, pulse 78, resp. rate 18, SpO2 96  %. There is no height or weight on file to calculate BMI.   Physical Exam:  General: Alert, interactive, in no acute distress. Eyes: No conjunctival injection bilaterally, no discharge on the right, no discharge on the left and no Horner-Trantas dots present. PERRL bilaterally. EOMI without pain. No photophobia.  Ears: Right TM pearly gray with normal light reflex, Left OME, Right TM intact without perforation and Left TM intact without perforation.  Nose/Throat: External nose within normal limits, nasal crease present and septum midline. Turbinates edematous with clear discharge. Posterior oropharynx mildly erythematous without cobblestoning in the posterior oropharynx. Tonsils 2+ without exudates.  Tongue without thrush. Lungs: Clear to auscultation without wheezing, rhonchi or rales. No increased work of breathing. CV: Normal S1/S2. No murmurs. Capillary refill <2 seconds.  Skin: Warm and dry, without lesions or rashes. Neuro:   Grossly intact. No focal deficits appreciated. Responsive to questions.  Diagnostic studies: none    [name], MD  [facility]

## 2017-09-14 ENCOUNTER — Other Ambulatory Visit: Payer: Self-pay

## 2017-09-14 DIAGNOSIS — R7989 Other specified abnormal findings of blood chemistry: Secondary | ICD-10-CM

## 2017-09-14 DIAGNOSIS — R945 Abnormal results of liver function studies: Secondary | ICD-10-CM

## 2017-10-10 DIAGNOSIS — E7800 Pure hypercholesterolemia, unspecified: Secondary | ICD-10-CM | POA: Diagnosis not present

## 2017-10-10 DIAGNOSIS — R945 Abnormal results of liver function studies: Secondary | ICD-10-CM | POA: Diagnosis not present

## 2017-10-10 DIAGNOSIS — I10 Essential (primary) hypertension: Secondary | ICD-10-CM | POA: Diagnosis not present

## 2017-10-10 DIAGNOSIS — F411 Generalized anxiety disorder: Secondary | ICD-10-CM | POA: Diagnosis not present

## 2017-10-10 DIAGNOSIS — K210 Gastro-esophageal reflux disease with esophagitis: Secondary | ICD-10-CM | POA: Diagnosis not present

## 2017-10-10 DIAGNOSIS — E119 Type 2 diabetes mellitus without complications: Secondary | ICD-10-CM | POA: Diagnosis not present

## 2017-10-10 DIAGNOSIS — F5221 Male erectile disorder: Secondary | ICD-10-CM | POA: Diagnosis not present

## 2017-10-10 DIAGNOSIS — R7309 Other abnormal glucose: Secondary | ICD-10-CM | POA: Diagnosis not present

## 2017-10-10 DIAGNOSIS — E291 Testicular hypofunction: Secondary | ICD-10-CM | POA: Diagnosis not present

## 2017-10-12 DIAGNOSIS — E119 Type 2 diabetes mellitus without complications: Secondary | ICD-10-CM | POA: Diagnosis not present

## 2017-10-12 DIAGNOSIS — E782 Mixed hyperlipidemia: Secondary | ICD-10-CM | POA: Diagnosis not present

## 2017-10-12 DIAGNOSIS — R945 Abnormal results of liver function studies: Secondary | ICD-10-CM | POA: Diagnosis not present

## 2017-10-12 DIAGNOSIS — Z6834 Body mass index (BMI) 34.0-34.9, adult: Secondary | ICD-10-CM | POA: Diagnosis not present

## 2017-10-12 DIAGNOSIS — I10 Essential (primary) hypertension: Secondary | ICD-10-CM | POA: Diagnosis not present

## 2017-10-12 DIAGNOSIS — M25561 Pain in right knee: Secondary | ICD-10-CM | POA: Diagnosis not present

## 2017-10-12 DIAGNOSIS — F5221 Male erectile disorder: Secondary | ICD-10-CM | POA: Diagnosis not present

## 2017-10-12 DIAGNOSIS — E291 Testicular hypofunction: Secondary | ICD-10-CM | POA: Diagnosis not present

## 2017-11-09 DIAGNOSIS — Z23 Encounter for immunization: Secondary | ICD-10-CM | POA: Diagnosis not present

## 2018-01-13 ENCOUNTER — Other Ambulatory Visit: Payer: Self-pay | Admitting: Allergy & Immunology

## 2018-01-20 DIAGNOSIS — F3341 Major depressive disorder, recurrent, in partial remission: Secondary | ICD-10-CM | POA: Diagnosis not present

## 2018-01-20 DIAGNOSIS — E7800 Pure hypercholesterolemia, unspecified: Secondary | ICD-10-CM | POA: Diagnosis not present

## 2018-01-20 DIAGNOSIS — E119 Type 2 diabetes mellitus without complications: Secondary | ICD-10-CM | POA: Diagnosis not present

## 2018-01-20 DIAGNOSIS — K210 Gastro-esophageal reflux disease with esophagitis: Secondary | ICD-10-CM | POA: Diagnosis not present

## 2018-01-25 ENCOUNTER — Encounter: Payer: Self-pay | Admitting: Gastroenterology

## 2018-01-25 ENCOUNTER — Ambulatory Visit (INDEPENDENT_AMBULATORY_CARE_PROVIDER_SITE_OTHER): Payer: PPO | Admitting: Gastroenterology

## 2018-01-25 VITALS — BP 149/91 | HR 101 | Temp 97.1°F | Ht 63.0 in | Wt 186.4 lb

## 2018-01-25 DIAGNOSIS — R05 Cough: Secondary | ICD-10-CM | POA: Diagnosis not present

## 2018-01-25 DIAGNOSIS — K219 Gastro-esophageal reflux disease without esophagitis: Secondary | ICD-10-CM

## 2018-01-25 DIAGNOSIS — K760 Fatty (change of) liver, not elsewhere classified: Secondary | ICD-10-CM

## 2018-01-25 DIAGNOSIS — R053 Chronic cough: Secondary | ICD-10-CM

## 2018-01-25 MED ORDER — RABEPRAZOLE SODIUM 20 MG PO TBEC: 20.0000 mg | DELAYED_RELEASE_TABLET | Freq: Two times a day (BID) | ORAL | 3 refills | Status: DC

## 2018-01-25 NOTE — Assessment & Plan Note (Signed)
Has been stable.  LFTs normal in July.  Continue exercise, weight management.  Plans for repeat LFTs next July.

## 2018-01-25 NOTE — Patient Instructions (Signed)
Stop pantoprazole.  Start aciphex 20mg  twice daily, before breakfast and before your evening meal.   Call when you are ready to order a chest xray.   Return to the office in three months to see Dr. Gala Romney.

## 2018-01-25 NOTE — Progress Notes (Signed)
CC'D TO PCP °

## 2018-01-25 NOTE — Assessment & Plan Note (Addendum)
Long history of GERD.  Well documented in 2008 via pH probe study.  Also well-documented correlation with cough and reflux during that study.  Patient states he had surgery for his reflux in 2008 but I do not have record of that.  Did well until January of this year.  Denies typical burning.  Biggest complaint is cough/phlegm.  Wakes up with lots of coughing, if he is able to get phlegm up he feels good for a few hours.  He has significant allergies/sinusitis with multiple environmental agents that he is allergic to.  Has been on allergy shots, nasal sprays but describes losing efficacy.  He notes improvement with his cough and phlegm when we change his PPI.  He usually has good results for a few months but then he started having the problem again.  Protonix 40 mg twice daily helped for about 4 months but he feels like he is back to restart it.  Last EGD in February.  See above.  We will switch PPI to AcipHex 20 mg twice daily before meals.  Return to the office in 3 months to see Dr. Gala Romney.  I suggested to the patient we consider chest x-ray for chronic cough, he would like to postpone for now as his wife is sick.  He will call when he is ready.  Also consider possibility of pulmonology consultation to rule out additional etiologies for his chronic cough.  We will hold off until chest x-ray done.

## 2018-01-25 NOTE — Progress Notes (Signed)
Primary Care Physician: Manon Hilding, MD  Primary Gastroenterologist:  Garfield Cornea, MD   Chief Complaint  Patient presents with  . Gastroesophageal Reflux    HPI: [name] Harper is a 57 y.o. male here for follow up. Seen back in 07/2017. He has h/o remote erosive reflux esophagitis, remotely treated for H.pylori. H/O a lot of coughing and phlegm in the mornings, deep coughing associated with nausea. Seen by allergist, receiving injections for 3 years, nasal sprays. Initially all helped but lost efficacy over time. Tried on Dexilant 60mg  daily, helped his cough/phelgm for couple of month but then stopped helping.  But with stopping Dexilant, his symptoms did worsen.  No typical reflux. Switched to pantoprazole BID after last ov.   He had an EGD and colonoscopy by Dr. Oneida Alar in February 2019, as he presented the wrong day for his procedures and had already prepped.  He was found to have normal esophagus, mild gastritis but no H. pylori.  Single benign polyp removed from the descending colon, diverticulosis, hemorrhoids.  Next colonoscopy in 10 years.  H/O mildly elevated AST/ALT with fatty liver on prior imaging with normalization of LFTs in setting of dietary changes and weight loss.   Patient has gained six pounds in the past six months.  Was doing well on pantoprazole but it stopped working about 2 months ago.  Cough is actually better when he saw his allergist again about 2 months ago he started having problems with deep coughing, phlegm production.  Denies typical heartburn.  If starts coughing, feels like it is a deep cough, phlegm comes up "from my stomach".  Denies vomiting but feels nauseated when this happens.  No swallowing problems.  No abdominal pain.  Bowel movements are regular.  No blood in the stool or melena.  Phlegm is white when he coughs it up.  Symptoms worse while he is in the shower.  Pertinent remote GI history: Patient seen back in 2007/2008 by our practice for  reflux.  He underwent an EGD in 2007 for 44-month history of chronic cough, some reflux symptoms.  Zegerid did not help.  He had distal esophageal erosions, small hiatal hernia, antral and bulbar erosions, diffuse gastric submucosa petechiae.  H. pylori serologies were positive and he was treated.  In 2008 had another endoscopy along with Bravo pH probe.  Persistent cough and phlegm buildup in the back of his throat unresponsive to Nexium twice daily, AcipHex.  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 pylori.  PH probe performed off PPI noting excessive gastroesophageal reflux with good correlation between cough and reflux.  Patient was sent to [facility] and underwent antireflux surgery per his report.    Current Outpatient Medications  Medication Sig Dispense Refill  . Azelastine HCl 137 MCG/SPRAY SOLN USE 1 TO 2 SPRAYS IN EACH NOSTRIL TWICE DAILY 30 mL 5  . citalopram (CELEXA) 40 MG tablet Take 40 mg by mouth daily.    . clonazePAM (KLONOPIN) 0.5 MG tablet Take 0.5 mg by mouth 3 (three) times daily as needed for anxiety. anxiety    . EPINEPHrine 0.3 mg/0.3 mL IJ SOAJ injection USE AS DIRECTED FOR A SEVERE ALLERGIC REACTION. 2 Device 2  . hydrochlorothiazide (HYDRODIURIL) 25 MG tablet Take 25 mg by mouth every morning.     . mometasone (NASONEX) 50 MCG/ACT nasal spray USE 2 SPRAYS IN EACH NOSTRIL DAILY 17 g 5  .  pantoprazole (PROTONIX) 40 MG tablet Take 1 tablet (40 mg total) by mouth 2 (two) times daily before a meal. 60 tablet 5  . simvastatin (ZOCOR) 20 MG tablet Take 20 mg by mouth at bedtime.      No current facility-administered medications for this visit.    Facility-Administered Medications Ordered in Other Visits  Medication Dose Route Frequency Provider Last Rate Last Dose  . bupivacaine liposome (EXPAREL) 1.3 % injection 266 mg  20 mL Infiltration Once Cecilio Asper, Amber, PA-C        Allergies as of  01/25/2018  . (No Known Allergies)    ROS:  General: Negative for anorexia, weight loss, fever, chills, fatigue, weakness. ENT: Negative for hoarseness, difficulty swallowing , nasal congestion. CV: Negative for chest pain, angina, palpitations, dyspnea on exertion, peripheral edema.  Respiratory: Negative for dyspnea at rest, dyspnea on exertion, positive cough, positive sputum, negative wheezing.  GI: See history of present illness. GU:  Negative for dysuria, hematuria, urinary incontinence, urinary frequency, nocturnal urination.  Endo: Negative for unusual weight change.    Physical Examination:   BP (!) 149/91   Pulse (!) 101   Temp (!) 97.1 F (36.2 C) (Oral)   Ht 5\' 3"  (1.6 m)   Wt 186 lb 6.4 oz (84.6 kg)   BMI 33.02 kg/m   General: Well-nourished, well-developed in no acute distress.  Eyes: No icterus. Mouth: Oropharyngeal mucosa moist and pink , no lesions erythema or exudate. Lungs: Clear to auscultation bilaterally.  Heart: Regular rate and rhythm, no murmurs rubs or gallops.  Abdomen: Bowel sounds are normal, nontender, nondistended, no hepatosplenomegaly or masses, no abdominal bruits or hernia , no rebound or guarding.   Extremities: No lower extremity edema. No clubbing or deformities. Neuro: Alert and oriented x 4   Skin: Warm and dry, no jaundice.   Psych: Alert and cooperative, normal mood and affect.  Labs:  Lab Results  Component Value Date   ALT 32 09/05/2017   AST 24 09/05/2017   ALKPHOS 81 10/21/2014   BILITOT 1.0 09/05/2017    Imaging Studies: No results found.

## 2018-01-26 DIAGNOSIS — E119 Type 2 diabetes mellitus without complications: Secondary | ICD-10-CM | POA: Diagnosis not present

## 2018-01-26 DIAGNOSIS — R945 Abnormal results of liver function studies: Secondary | ICD-10-CM | POA: Diagnosis not present

## 2018-01-26 DIAGNOSIS — E7800 Pure hypercholesterolemia, unspecified: Secondary | ICD-10-CM | POA: Diagnosis not present

## 2018-01-26 DIAGNOSIS — E782 Mixed hyperlipidemia: Secondary | ICD-10-CM | POA: Diagnosis not present

## 2018-01-26 DIAGNOSIS — I10 Essential (primary) hypertension: Secondary | ICD-10-CM | POA: Diagnosis not present

## 2018-01-26 DIAGNOSIS — K210 Gastro-esophageal reflux disease with esophagitis: Secondary | ICD-10-CM | POA: Diagnosis not present

## 2018-01-26 DIAGNOSIS — E291 Testicular hypofunction: Secondary | ICD-10-CM | POA: Diagnosis not present

## 2018-01-31 DIAGNOSIS — Z6835 Body mass index (BMI) 35.0-35.9, adult: Secondary | ICD-10-CM | POA: Diagnosis not present

## 2018-01-31 DIAGNOSIS — R945 Abnormal results of liver function studies: Secondary | ICD-10-CM | POA: Diagnosis not present

## 2018-01-31 DIAGNOSIS — E782 Mixed hyperlipidemia: Secondary | ICD-10-CM | POA: Diagnosis not present

## 2018-01-31 DIAGNOSIS — M25561 Pain in right knee: Secondary | ICD-10-CM | POA: Diagnosis not present

## 2018-01-31 DIAGNOSIS — F5221 Male erectile disorder: Secondary | ICD-10-CM | POA: Diagnosis not present

## 2018-01-31 DIAGNOSIS — E119 Type 2 diabetes mellitus without complications: Secondary | ICD-10-CM | POA: Diagnosis not present

## 2018-01-31 DIAGNOSIS — E291 Testicular hypofunction: Secondary | ICD-10-CM | POA: Diagnosis not present

## 2018-01-31 DIAGNOSIS — I10 Essential (primary) hypertension: Secondary | ICD-10-CM | POA: Diagnosis not present

## 2018-02-19 DIAGNOSIS — I10 Essential (primary) hypertension: Secondary | ICD-10-CM | POA: Diagnosis not present

## 2018-02-19 DIAGNOSIS — E119 Type 2 diabetes mellitus without complications: Secondary | ICD-10-CM | POA: Diagnosis not present

## 2018-02-19 DIAGNOSIS — E782 Mixed hyperlipidemia: Secondary | ICD-10-CM | POA: Diagnosis not present

## 2018-04-03 ENCOUNTER — Encounter: Payer: Self-pay | Admitting: Internal Medicine

## 2018-04-20 ENCOUNTER — Ambulatory Visit: Payer: PPO | Admitting: Internal Medicine

## 2018-05-03 DIAGNOSIS — R110 Nausea: Secondary | ICD-10-CM | POA: Diagnosis not present

## 2018-05-03 DIAGNOSIS — F3341 Major depressive disorder, recurrent, in partial remission: Secondary | ICD-10-CM | POA: Diagnosis not present

## 2018-05-03 DIAGNOSIS — F411 Generalized anxiety disorder: Secondary | ICD-10-CM | POA: Diagnosis not present

## 2018-05-03 DIAGNOSIS — Z6835 Body mass index (BMI) 35.0-35.9, adult: Secondary | ICD-10-CM | POA: Diagnosis not present

## 2018-05-08 DIAGNOSIS — R945 Abnormal results of liver function studies: Secondary | ICD-10-CM | POA: Diagnosis not present

## 2018-05-08 DIAGNOSIS — R110 Nausea: Secondary | ICD-10-CM | POA: Diagnosis not present

## 2018-05-08 DIAGNOSIS — K760 Fatty (change of) liver, not elsewhere classified: Secondary | ICD-10-CM | POA: Diagnosis not present

## 2018-05-23 ENCOUNTER — Other Ambulatory Visit: Payer: Self-pay

## 2018-05-30 DIAGNOSIS — K210 Gastro-esophageal reflux disease with esophagitis: Secondary | ICD-10-CM | POA: Diagnosis not present

## 2018-05-30 DIAGNOSIS — E860 Dehydration: Secondary | ICD-10-CM | POA: Diagnosis not present

## 2018-05-30 DIAGNOSIS — E119 Type 2 diabetes mellitus without complications: Secondary | ICD-10-CM | POA: Diagnosis not present

## 2018-05-30 DIAGNOSIS — R197 Diarrhea, unspecified: Secondary | ICD-10-CM | POA: Diagnosis not present

## 2018-05-30 DIAGNOSIS — I9589 Other hypotension: Secondary | ICD-10-CM | POA: Diagnosis not present

## 2018-05-30 DIAGNOSIS — I10 Essential (primary) hypertension: Secondary | ICD-10-CM | POA: Diagnosis not present

## 2018-05-30 DIAGNOSIS — E7800 Pure hypercholesterolemia, unspecified: Secondary | ICD-10-CM | POA: Diagnosis not present

## 2018-05-30 DIAGNOSIS — E782 Mixed hyperlipidemia: Secondary | ICD-10-CM | POA: Diagnosis not present

## 2018-05-30 DIAGNOSIS — R945 Abnormal results of liver function studies: Secondary | ICD-10-CM | POA: Diagnosis not present

## 2018-06-06 DIAGNOSIS — E291 Testicular hypofunction: Secondary | ICD-10-CM | POA: Diagnosis not present

## 2018-06-06 DIAGNOSIS — I10 Essential (primary) hypertension: Secondary | ICD-10-CM | POA: Diagnosis not present

## 2018-06-06 DIAGNOSIS — F5221 Male erectile disorder: Secondary | ICD-10-CM | POA: Diagnosis not present

## 2018-06-06 DIAGNOSIS — Z6836 Body mass index (BMI) 36.0-36.9, adult: Secondary | ICD-10-CM | POA: Diagnosis not present

## 2018-06-06 DIAGNOSIS — E119 Type 2 diabetes mellitus without complications: Secondary | ICD-10-CM | POA: Diagnosis not present

## 2018-06-06 DIAGNOSIS — F411 Generalized anxiety disorder: Secondary | ICD-10-CM | POA: Diagnosis not present

## 2018-06-06 DIAGNOSIS — E782 Mixed hyperlipidemia: Secondary | ICD-10-CM | POA: Diagnosis not present

## 2018-06-06 DIAGNOSIS — R945 Abnormal results of liver function studies: Secondary | ICD-10-CM | POA: Diagnosis not present

## 2018-06-19 ENCOUNTER — Telehealth: Payer: Self-pay | Admitting: *Deleted

## 2018-06-19 ENCOUNTER — Encounter: Payer: Self-pay | Admitting: Internal Medicine

## 2018-06-19 ENCOUNTER — Other Ambulatory Visit: Payer: Self-pay

## 2018-06-19 ENCOUNTER — Ambulatory Visit (INDEPENDENT_AMBULATORY_CARE_PROVIDER_SITE_OTHER): Payer: PPO | Admitting: Internal Medicine

## 2018-06-19 DIAGNOSIS — K219 Gastro-esophageal reflux disease without esophagitis: Secondary | ICD-10-CM

## 2018-06-19 MED ORDER — ESOMEPRAZOLE MAGNESIUM 40 MG PO CPDR: 40.0000 mg | DELAYED_RELEASE_CAPSULE | Freq: Two times a day (BID) | ORAL | 3 refills | Status: DC

## 2018-06-19 NOTE — Telephone Encounter (Signed)
Patient called stating he checked with his pharmacy and his new reflux medication has not been sent in yet.

## 2018-06-19 NOTE — Patient Instructions (Signed)
  We will send information on antireflux lifestyle/diet.  Our goal is to achieve a 15 pound weight loss in the next 3 months  Stop AcipHex  Begin Nexium 40 mg twice daily- before breakfast and supper.  We will dispense 60.  With 3 refills.  I would like to retrieve and review the notes from the operation regarding her antireflux surgery at [facility].  Face-to-face office visit in about 6 weeks.  Further evaluation of your symptoms may be warranted including, but not limited to, the referral back over to [facility].  Further recommendations to follow.

## 2018-06-19 NOTE — Progress Notes (Signed)
Referring Provider:  Primary Care Physician:  Manon Hilding, MD  Primary GI:   Virtual Visit via Telephone Note Due to COVID-19, visit is conducted virtually and was requested by patient.   I connected with [name] Harper on 06/19/18 at 10:30 AM EDT by telephone and verified that I am speaking with the correct person using two identifiers.   I discussed the limitations, risks, security and privacy concerns of performing an evaluation and management service by telephone and the availability of in person appointments. I also discussed with the patient that there may be a patient responsible charge related to this service. The patient expressed understanding and agreed to proceed.  Chief Complaint  Patient presents with  . Gastroesophageal Reflux    f/u. Reports still coughing even though he was changed to aciphex, feels acid in throat. Aciphex stopped helping past 2-3 months. Wants to know if he can be changed to something else. Feels he needs some kind of test on his stomach to see what is causing it. Did not have Chest xray done as recommended d/t spouse was sick and then she passed away Feb 13, 2018.  Marland Kitchen abnormal lft's     History of Present Illness:    58 year old Hispanic (English-speaking) gentleman with dyspepsia/reflux symptoms which she state have been somewhat refractory over the past 3 to 4 years.  He reports ongoing antireflux surgery at [facility] back in about 2007  / 2008 with excellent results until about 3 years ago.  He has been on multiple PPIs;  he gets short-term improvement and then relapses within a few weeks.  No dysphagia.  He lost his wife and his dogs in the last year.  We noted that he had gained 6 pounds at the time of his last visit weight 186 pounds.  He tells me at home now he weighs 196 pounds.  He has been eating;  he has not been throwing up - no significant abdominal pain, no melena hematochezia or other change in bowel habits.  He saw Dr. Quintin Alto last week.  He  tells me he did not discussed any of his GI issues with him.  Interestingly, EGD last year by Dr. Oneida Alar demonstrated some non-H. pylori gastritis.  Esophagus looked good.  I reviewed the procedure note and looked at the retroflexed images.  I did not appreciate much of a wrap, if any.  Most recently, has been on AcipHex 20 mg twice daily.  Says it does not really help.   Past Medical History:  Diagnosis Date  . Anxiety   . Arthritis   . Bronchitis   . Complication of anesthesia   . Depression   . Hypercholesterolemia   . Hypertension   . PONV (postoperative nausea and vomiting)   . Pre-diabetes    pt stated     Past Surgical History:  Procedure Laterality Date  . ABDOMINAL SURGERY  2007   hiatal hernia repair  . BIOPSY  03/28/2017   Procedure: BIOPSY;  Surgeon: Danie Binder, MD;  Location: [facility];  Service: Endoscopy;;  gastric bx's and gastric polyp  . COLONOSCOPY WITH PROPOFOL N/A 03/28/2017   Dr. Oneida Alar: Few small and large mouth diverticula in the rectosigmoid colon, sigmoid colon, descending colon.  External and internal hemorrhoids noted.  One descending colon polyp removed, pathology benign.  Return colonoscopy in 10 years.  . ESOPHAGOGASTRODUODENOSCOPY (EGD) WITH PROPOFOL N/A 03/28/2017   Dr. Oneida Alar: Patchy mild inflammation and erythema in the gastric antrum, negative H. pylori  on biopsies.  On biopsy he had chronic gastritis, negative for dysplasia.    Marland Kitchen KNEE ARTHROSCOPY  2016   rt knee  . POLYPECTOMY  03/28/2017   Procedure: POLYPECTOMY;  Surgeon: Danie Binder, MD;  Location: [facility];  Service: Endoscopy;;  descending colon polyp  . TOTAL KNEE ARTHROPLASTY Right 10/29/2014   Procedure: RIGHT TOTAL KNEE ARTHROPLASTY;  Surgeon: Latanya Maudlin, MD;  Location: [facility];  Service: Orthopedics;  Laterality: Right;     Current Meds  Medication Sig  . Azelastine HCl 137 MCG/SPRAY SOLN USE 1 TO 2 SPRAYS IN EACH NOSTRIL TWICE DAILY  . citalopram (CELEXA) 40 MG  tablet Take 40 mg by mouth daily.  . clonazePAM (KLONOPIN) 0.5 MG tablet Take 0.5 mg by mouth 3 (three) times daily as needed for anxiety. anxiety  . EPINEPHrine 0.3 mg/0.3 mL IJ SOAJ injection USE AS DIRECTED FOR A SEVERE ALLERGIC REACTION.  . hydrochlorothiazide (HYDRODIURIL) 25 MG tablet Take 25 mg by mouth every morning.   . mometasone (NASONEX) 50 MCG/ACT nasal spray USE 2 SPRAYS IN EACH NOSTRIL DAILY  . RABEprazole (ACIPHEX) 20 MG tablet Take 1 tablet (20 mg total) by mouth 2 (two) times daily before a meal.  . simvastatin (ZOCOR) 20 MG tablet Take 20 mg by mouth at bedtime.       Review of Systems: As in history of present illness    Observations/Objective: No distress. Unable to perform physical exam due to telephone encounter. No video available.   Assessment and Plan: 58 year old gentleman who has prominent complaints of reflux/regurgitation increasing phlegm and cough.  He likens this to longstanding chronic reflux symptoms.  By report, he is steadily gaining weight.  I pointed out this may be working against his terms of managing his reflux  I'm struck by the fact that he reports a good result from antireflux surgery 12 or 13 years ago until about 3 years ago.  I reviewed the endoscopy photos demonstrates really no stigmata of a prior wrap.  It may have slipped.  He has ENT/upper respiratory tract symptoms may or may not be related to LPR.   Follow Up Instructions:   We will send information on antireflux lifestyle/diet.  Our goal is to achieve a 15 pound weight loss in the next 3 months  Stop AcipHex  Begin Nexium 40 mg twice daily- before breakfast and supper.  We will dispense 60.  With 3 refills.  I would like to retrieve and review the notes from the operation regarding her antireflux surgery at [facility].  Face-to-face office visit in about 6 weeks.  Further evaluation of your symptoms may be warranted including, but not limited to, the referral back over to  [facility].  Further recommendations to follow.    I discussed the assessment and treatment plan with the patient. The patient was provided an opportunity to ask questions and all were answered. The patient agreed with the plan and demonstrated an understanding of the instructions.   The patient was advised to call back or seek an in-person evaluation if the symptoms worsen or if the condition fails to improve as anticipated.  I provided 15.5 minutes of non-face-to-face time during this encounter.  Bridgette Habermann, MD [facility]

## 2018-06-19 NOTE — Telephone Encounter (Signed)
Spoke with pt Esomeprazole 40 mg bid with 3 rfs was sent to his pharmacy. Pt is aware.

## 2018-07-20 ENCOUNTER — Other Ambulatory Visit: Payer: Self-pay | Admitting: Allergy & Immunology

## 2018-07-31 ENCOUNTER — Other Ambulatory Visit: Payer: Self-pay | Admitting: *Deleted

## 2018-07-31 ENCOUNTER — Encounter: Payer: Self-pay | Admitting: *Deleted

## 2018-07-31 DIAGNOSIS — R945 Abnormal results of liver function studies: Secondary | ICD-10-CM

## 2018-07-31 DIAGNOSIS — R7989 Other specified abnormal findings of blood chemistry: Secondary | ICD-10-CM

## 2018-08-14 ENCOUNTER — Other Ambulatory Visit: Payer: Self-pay

## 2018-08-14 ENCOUNTER — Ambulatory Visit: Payer: PPO | Admitting: Internal Medicine

## 2018-08-14 ENCOUNTER — Encounter: Payer: Self-pay | Admitting: Internal Medicine

## 2018-08-14 VITALS — BP 158/101 | HR 98 | Temp 98.1°F | Ht 61.0 in | Wt 194.0 lb

## 2018-08-14 DIAGNOSIS — K219 Gastro-esophageal reflux disease without esophagitis: Secondary | ICD-10-CM

## 2018-08-14 NOTE — Patient Instructions (Signed)
Schedule an EGD - diagnostic - refractory reflux - propofol  Continue Esomeprazole 40 mg twice daily for now  We will retrieve operative note from [facility] done for reflux 13 years ago  Your surgery may have come undone  You may need to go back  To [facility].  Further recommendations to follow

## 2018-08-14 NOTE — Progress Notes (Signed)
Primary Care Physician:  Manon Hilding, MD Primary Gastroenterologist:  Dr. Gala Romney  Pre-Procedure History & Physical: HPI:  [name] Harper is a 58 y.o. male here for follow-up refractory GERD and regurgitation.  "Phlegm in the back of his throat".  He has had transient improvement in symptoms with a number of PPIs albeit short-lived.  No dysphagia.  History of antireflux surgery at [facility] 13/14 years ago.  Excellent results until about 3 years ago when he started having symptoms.  EGD report from last year did not mention the presence or absence of a wrap.  I did review the Endo photos and did not see much of a wrap.  I am suspicious it may have "slipped" or come undone completely. Patient's been on multiple topical agents for "allergies" during this time without much relief. Most recently S omeprazole 40 mg twice daily has not been effective in ameliorating his symptoms.  Past Medical History:  Diagnosis Date  . Anxiety   . Arthritis   . Bronchitis   . Complication of anesthesia   . Depression   . Hypercholesterolemia   . Hypertension   . PONV (postoperative nausea and vomiting)   . Pre-diabetes    pt stated    Past Surgical History:  Procedure Laterality Date  . ABDOMINAL SURGERY  2007   hiatal hernia repair  . BIOPSY  03/28/2017   Procedure: BIOPSY;  Surgeon: Danie Binder, MD;  [facility];  Service: Endoscopy;;  gastric bx's and gastric polyp  . COLONOSCOPY WITH PROPOFOL N/A 03/28/2017   Dr. Oneida Alar: Few small and large mouth diverticula in the rectosigmoid colon, sigmoid colon, descending colon.  External and internal hemorrhoids noted.  One descending colon polyp removed, pathology benign.  Return colonoscopy in 10 years.  . ESOPHAGOGASTRODUODENOSCOPY (EGD) WITH PROPOFOL N/A 03/28/2017   Dr. Oneida Alar: Patchy mild inflammation and erythema in the gastric antrum, negative H. pylori on biopsies.  On biopsy he had chronic gastritis, negative for dysplasia.    Marland Kitchen KNEE  ARTHROSCOPY  2016   rt knee  . POLYPECTOMY  03/28/2017   Procedure: POLYPECTOMY;  Surgeon: Danie Binder, MD;  [facility];  Service: Endoscopy;;  descending colon polyp  . TOTAL KNEE ARTHROPLASTY Right 10/29/2014   Procedure: RIGHT TOTAL KNEE ARTHROPLASTY;  Surgeon: Latanya Maudlin, MD;  Location: [facility];  Service: Orthopedics;  Laterality: Right;    Prior to Admission medications   Medication Sig Start Date End Date Taking? Authorizing Provider  azelastine (ASTELIN) 0.1 % nasal spray USE 1 TO 2 SPRAYS IN EACH NOSTRIL TWICE DAILY 07/20/18  Yes Valentina Shaggy, MD  citalopram (CELEXA) 40 MG tablet Take 40 mg by mouth daily.   Yes [provider]  clonazePAM (KLONOPIN) 0.5 MG tablet Take 0.5 mg by mouth 3 (three) times daily as needed for anxiety. anxiety 02/10/14  Yes [provider]  esomeprazole (NEXIUM) 40 MG capsule Take 1 capsule (40 mg total) by mouth 2 (two) times daily before a meal. 06/19/18  Yes Noelie Renfrow, Cristopher Estimable, MD  hydrochlorothiazide (HYDRODIURIL) 25 MG tablet Take 25 mg by mouth every morning.  11/25/13  Yes [provider]  mometasone (NASONEX) 50 MCG/ACT nasal spray USE 2 SPRAYS IN EACH NOSTRIL DAILY 07/20/18  Yes Valentina Shaggy, MD  simvastatin (ZOCOR) 20 MG tablet Take 20 mg by mouth at bedtime.  11/12/13  Yes [provider]  EPINEPHrine 0.3 mg/0.3 mL IJ SOAJ injection USE AS DIRECTED FOR A  SEVERE ALLERGIC REACTION. Patient not taking: Reported on 08/14/2018 07/21/15   Gean Quint, MD  RABEprazole (ACIPHEX) 20 MG tablet Take 1 tablet (20 mg total) by mouth 2 (two) times daily before a meal. Patient not taking: Reported on 08/14/2018 01/25/18   Mahala Menghini, PA-C    Allergies as of 08/14/2018  . (No Known Allergies)    Family History  Problem Relation Age of Onset  . Lung disease Mother   . Colon cancer Neg Hx     Social History   Socioeconomic History  . Marital status: Married    Spouse name: Not on file   . Number of children: Not on file  . Years of education: Not on file  . Highest education level: Not on file  Occupational History  . Not on file  Social Needs  . Financial resource strain: Not on file  . Food insecurity    Worry: Not on file    Inability: Not on file  . Transportation needs    Medical: Not on file    Non-medical: Not on file  Tobacco Use  . Smoking status: Never Smoker  . Smokeless tobacco: Never Used  Substance and Sexual Activity  . Alcohol use: No    Alcohol/week: 0.0 standard drinks  . Drug use: No  . Sexual activity: Yes    Birth control/protection: None  Lifestyle  . Physical activity    Days per week: Not on file    Minutes per session: Not on file  . Stress: Not on file  Relationships  . Social Herbalist on phone: Not on file    Gets together: Not on file    Attends religious service: Not on file    Active member of club or organization: Not on file    Attends meetings of clubs or organizations: Not on file    Relationship status: Not on file  . Intimate partner violence    Fear of current or ex partner: Not on file    Emotionally abused: Not on file    Physically abused: Not on file    Forced sexual activity: Not on file  Other Topics Concern  . Not on file  Social History Narrative  . Not on file    Review of Systems: See HPI, otherwise negative ROS  Physical Exam: BP (!) 158/101   Pulse 98   Temp 98.1 F (36.7 C) (Oral)   Ht 5\' 1"  (1.549 m)   Wt 194 lb (88 kg)   BMI 36.66 kg/m  General:   Alert, pleasant and cooperative in NAD Neck:  Supple; no masses or thyromegaly. No significant cervical adenopathy. Lungs:  Clear throughout to auscultation.   No wheezes, crackles, or rhonchi. No acute distress. Heart:  Regular rate and rhythm; no murmurs, clicks, rubs,  or gallops. Abdomen: Non-distended, normal bowel sounds.  Soft and nontender without appreciable mass or hepatosplenomegaly.  Pulses:  Normal pulses noted.  Extremities:  Without clubbing or edema.  Impression/Plan: 58 year old gentleman with apparent refractory reflux /ENT symptoms as well which at least are temporally related to the onset of recurrent reflux symptoms about 3 years ago.  Historically, he had the symptoms pre-surgery.  They were abolished with antireflux surgery at [facility].  I wonder off all of his issues go back to reflux.  I am suspicious that his surgically created antireflux barriers no longer present (or no longer effective).  Years endoscopic photographs do not suggest an intact wrap.  Recommendations: I told him given the refractory nature of his symptoms we ought to go ahead and repeat his EGD and look specifically at the EG junction.  Ultimately, he may need to go back to [facility] and be reassessed.  I am not sure why he has transient improvement once he is switched to a different PPI and then has a relapse.  ? placebo effect. The risks, benefits, limitations, alternatives and imponderables have been reviewed with the patient. Potential for esophageal dilation, biopsy, etc. have also been reviewed.  Questions have been answered. All parties agreeable.  We will also have staff retrieved in the operative note related to his antireflux surgery done at [facility] previously.  Recheck blood pressure in the near future.  Further recommendations to follow.       Notice: This dictation was prepared with Dragon dictation along with smaller phrase technology. Any transcriptional errors that result from this process are unintentional and may not be corrected upon review.

## 2018-08-15 ENCOUNTER — Telehealth: Payer: Self-pay | Admitting: Internal Medicine

## 2018-08-15 ENCOUNTER — Telehealth: Payer: Self-pay

## 2018-08-15 ENCOUNTER — Other Ambulatory Visit: Payer: Self-pay

## 2018-08-15 DIAGNOSIS — K219 Gastro-esophageal reflux disease without esophagitis: Secondary | ICD-10-CM

## 2018-08-15 NOTE — Telephone Encounter (Signed)
Pre-op appt 09/19/18 at 2:45pm. Appt letter mailed with procedure instructions.

## 2018-08-15 NOTE — Telephone Encounter (Signed)
Pt called needing to speak with MB about changing his procedure date. [phone]

## 2018-08-15 NOTE — Telephone Encounter (Signed)
I spoke to granddaughter, Renelda Mom regarding my impression and recommendations from the office visit yesterday.  Her questions were answered.  This communication was made at patient's request.

## 2018-08-15 NOTE — Telephone Encounter (Signed)
Spoke to pt this morning, EGD w/Propofol w/RMR scheduled for 09/24/18 at 2:45pm. He requested for me to call his granddaughter [name] Harper to inform her of procedure. Called and informed granddaughter. Updated emergency contact to list granddaughter d/t his spouse passed away. Orders entered for procedure.   Returned his call, he was confused about date of procedure.

## 2018-08-15 NOTE — Telephone Encounter (Signed)
Pt requested at office visit yesterday that Dr. Gala Romney would call his granddaughter who looks after him and explain his diagnosis and plan.  Tretha Sciara has scheduled him for the EGD on 09/24/2018.   PT's granddaughter is:   Renelda Mom and her number is  [phone] or work number [phone].

## 2018-08-29 DIAGNOSIS — Z6836 Body mass index (BMI) 36.0-36.9, adult: Secondary | ICD-10-CM | POA: Diagnosis not present

## 2018-08-29 DIAGNOSIS — S20211A Contusion of right front wall of thorax, initial encounter: Secondary | ICD-10-CM | POA: Diagnosis not present

## 2018-09-11 ENCOUNTER — Ambulatory Visit: Payer: PPO | Admitting: Allergy & Immunology

## 2018-09-12 ENCOUNTER — Ambulatory Visit: Payer: PPO | Admitting: Allergy & Immunology

## 2018-09-14 DIAGNOSIS — R945 Abnormal results of liver function studies: Secondary | ICD-10-CM | POA: Diagnosis not present

## 2018-09-14 LAB — HEPATIC FUNCTION PANEL
AG Ratio: 1.7 (calc) (ref 1.0–2.5)
ALT: 84 U/L — ABNORMAL HIGH (ref 9–46)
AST: 68 U/L — ABNORMAL HIGH (ref 10–35)
Albumin: 4.8 g/dL (ref 3.6–5.1)
Alkaline phosphatase (APISO): 92 U/L (ref 35–144)
Bilirubin, Direct: 0.2 mg/dL (ref 0.0–0.2)
Globulin: 2.8 g/dL (calc) (ref 1.9–3.7)
Indirect Bilirubin: 0.6 mg/dL (calc) (ref 0.2–1.2)
Total Bilirubin: 0.8 mg/dL (ref 0.2–1.2)
Total Protein: 7.6 g/dL (ref 6.1–8.1)

## 2018-09-18 ENCOUNTER — Other Ambulatory Visit: Payer: Self-pay

## 2018-09-18 DIAGNOSIS — R945 Abnormal results of liver function studies: Secondary | ICD-10-CM

## 2018-09-18 DIAGNOSIS — R7989 Other specified abnormal findings of blood chemistry: Secondary | ICD-10-CM

## 2018-09-19 ENCOUNTER — Encounter (HOSPITAL_COMMUNITY)
Admission: RE | Admit: 2018-09-19 | Discharge: 2018-09-19 | Disposition: A | Discharge status: Home / Self Care | Payer: PPO | Source: Ambulatory Visit | Attending: Internal Medicine | Admitting: Internal Medicine

## 2018-09-19 ENCOUNTER — Encounter (HOSPITAL_COMMUNITY): Payer: Self-pay

## 2018-09-19 ENCOUNTER — Other Ambulatory Visit: Payer: Self-pay

## 2018-09-19 ENCOUNTER — Ambulatory Visit: Payer: PPO | Admitting: Allergy & Immunology

## 2018-09-19 ENCOUNTER — Ambulatory Visit (INDEPENDENT_AMBULATORY_CARE_PROVIDER_SITE_OTHER): Payer: PPO | Admitting: Allergy & Immunology

## 2018-09-19 ENCOUNTER — Encounter: Payer: Self-pay | Admitting: Allergy & Immunology

## 2018-09-19 DIAGNOSIS — J3089 Other allergic rhinitis: Secondary | ICD-10-CM | POA: Diagnosis not present

## 2018-09-19 DIAGNOSIS — J302 Other seasonal allergic rhinitis: Secondary | ICD-10-CM | POA: Diagnosis not present

## 2018-09-19 DIAGNOSIS — K219 Gastro-esophageal reflux disease without esophagitis: Secondary | ICD-10-CM

## 2018-09-19 MED ORDER — MOMETASONE FUROATE 50 MCG/ACT NA SUSP: 2.0000 | Freq: Every day | NASAL | 5 refills | Status: DC

## 2018-09-19 MED ORDER — AZELASTINE HCL 0.1 % NA SOLN: 1.0000 | Freq: Two times a day (BID) | NASAL | 5 refills | Status: DC

## 2018-09-19 NOTE — Patient Instructions (Signed)
Understands procedure is scheduled for 09/24/2018 to arrive to the surgery center at 1315. Understands he may take his morning meds on the day of his procedure with a sip of water. Understands not to eat or drink anything after MN Sunday.   To have his covid test on 09/20/2018 at 1525. Directions given to patient of site location.  Verbalized understanding. To call [phone] for any questions   Abbie Sons RN-BC

## 2018-09-19 NOTE — Patient Instructions (Addendum)
1. Allergic rhinoconjunctivitis - stopped allergy shots November 2018 - It seems that your symptoms are well controlled with the current regimen. - We are going to go ahead and maintain the same medications since these are working well.  - There does not seem to be a need to restart the allergy shots at this point.  - Continue mometasone nasal spray one spray per nostril daily. - Continue with azelastine one spray twice daily as needed. - Continue with Xyzal 5mg  daily as needed.   2. GERD    - We will follow up with the results of the endoscopy. - Continue with the medication regimen, per Dr. Gala Romney.   3. Return in about 1 year (around 09/19/2019). This can be an in-person, a virtual Webex or a telephone follow up visit.   Please inform us of any Emergency Department visits, hospitalizations, or changes in symptoms. Call us before going to the ED for breathing or allergy symptoms since we might be able to fit you in for a sick visit. Feel free to contact us anytime with any questions, problems, or concerns.  It was a pleasure to talk to you today today!  Websites that have reliable patient information: 1. American Academy of Asthma, Allergy, and Immunology: www.aaaai.org 2. Food Allergy Research and Education (FARE): foodallergy.org 3. Mothers of Asthmatics: http://www.asthmacommunitynetwork.org 4. American College of Allergy, Asthma, and Immunology: www.acaai.org  "Like" [place] on Facebook and Instagram for our latest updates!      Make sure you are registered to vote! If you have moved or changed any of your contact information, you will need to get this updated before voting!  In some cases, you MAY be able to register to vote online: [email or web address]    Voter ID laws are NOT going into effect for the General Election in November 2020! DO NOT let this stop you from exercising your right to vote!   Absentee voting is the SAFEST way to vote during the  coronavirus pandemic!   Download and print an absentee ballot request form at [email or web address]-Request or you can scan the QR code below with your smart phone:      More information on absentee ballots can be found here: [email or web address]

## 2018-09-19 NOTE — Progress Notes (Signed)
RE: [name] Harper MRN: [id number] DOB: [birth year] Date of Telemedicine Visit: 09/19/2018  Referring provider: Manon Hilding, MD Primary care provider: Manon Hilding, MD  Chief Complaint: Allergic Rhinitis  (Televisit at home. Patient gave verbal consent to treat and bill insurance for this visit.)   Telemedicine Follow Up Visit via Telephone: I connected with [name] Harper for a follow up on 09/19/18 by telephone and verified that I am speaking with the correct person using two identifiers.   I discussed the limitations, risks, security and privacy concerns of performing an evaluation and management service by telephone and the availability of in person appointments. I also discussed with the patient that there may be a patient responsible charge related to this service. The patient expressed understanding and agreed to proceed.  Patient is at home accompanied by his wife who provided/contributed to the history.  Provider is at the office.  Visit start time: 10:25 AM Visit end time: 10:44 AM Insurance consent/check in by: Anderson Malta Medical consent and medical assistant/nurse: Caryl Pina  History of Present Illness:  He is a 58 y.o. male, who is being followed for seasonal and perennial rhinitis. His previous allergy office visit was in July 2019 with myself.  At the last visit, we continued his mometasone nasal spray and Astelin nasal spray as well as Xyzal 5 mg daily.  For his reflux we continued with Protonix twice daily.  We also recommended continued follow-up with Dr. Gala Romney for his severe reflux.   Since last visit, from an allergy perspective he has done very well.  He remains on the nasal sprays which he has decreased down to once a day.  He remains on the Xyzal 5 mg daily as well.  Apparently, they recently got rid of all the animals in the house which has improved his symptoms remarkably.  He has had no sinus infections.  He does not feel that he needs allergy shots any longer and  has remained fairly stable since stopping them in 2018.  He is scheduled to have an EGD on August 3.  He continues to follow with Dr. Manus Rudd.   Otherwise, there have been no changes to his past medical history, surgical history, family history, or social history.  Assessment and Plan:  [name] Harper is a 58 y.o. male with:  Gastroesophageal reflux disease - on Protonix twice daily  Seasonal and perennial allergic rhinitis (grasses, weeds, trees, molds, DM, dog, cat, cockroach)   1. Allergic rhinoconjunctivitis - stopped allergy shots November 2018 - It seems that your symptoms are well controlled with the current regimen. - We are going to go ahead and maintain the same medications since these are working well.  - There does not seem to be a need to restart the allergy shots at this point.  - Continue mometasone nasal spray one spray per nostril daily. - Continue with azelastine one spray twice daily as needed. - Continue with Xyzal 5mg  daily as needed.   2. GERD    - We will follow up with the results of the endoscopy. - Continue with the medication regimen, per Dr. Gala Romney.   3. Return in about 1 year (around 09/19/2019). This can be an in-person, a virtual Webex or a telephone follow up visit.   Diagnostics: None.  Medication List:  Current Outpatient Medications  Medication Sig Dispense Refill  . azelastine (ASTELIN) 0.1 % nasal spray USE 1 TO 2 SPRAYS IN EACH NOSTRIL TWICE DAILY (Patient taking differently: Place 1-2 sprays into  both nostrils 2 (two) times daily. ) 30 mL 1  . citalopram (CELEXA) 40 MG tablet Take 40 mg by mouth daily.    . clonazePAM (KLONOPIN) 0.5 MG tablet Take 0.5 mg by mouth 3 (three) times daily as needed for anxiety. anxiety    . EPINEPHrine 0.3 mg/0.3 mL IJ SOAJ injection USE AS DIRECTED FOR A SEVERE ALLERGIC REACTION. 2 Device 2  . esomeprazole (NEXIUM) 40 MG capsule Take 1 capsule (40 mg total) by mouth 2 (two) times daily before a meal. 60 capsule  3  . hydrochlorothiazide (HYDRODIURIL) 25 MG tablet Take 25 mg by mouth every morning.     . mometasone (NASONEX) 50 MCG/ACT nasal spray USE 2 SPRAYS IN EACH NOSTRIL DAILY (Patient taking differently: Place 2 sprays into the nose daily. ) 17 g 1  . ondansetron (ZOFRAN) 4 MG tablet     . simvastatin (ZOCOR) 20 MG tablet Take 20 mg by mouth at bedtime.     . RABEprazole (ACIPHEX) 20 MG tablet Take 1 tablet (20 mg total) by mouth 2 (two) times daily before a meal. (Patient not taking: Reported on 08/14/2018) 180 tablet 3   No current facility-administered medications for this visit.    Facility-Administered Medications Ordered in Other Visits  Medication Dose Route Frequency Provider Last Rate Last Dose  . bupivacaine liposome (EXPAREL) 1.3 % injection 266 mg  20 mL Infiltration Once [place], [employer], PA-C       Allergies: No Known Allergies I reviewed his past medical history, social history, family history, and environmental history and no significant changes have been reported from previous visits.  Review of Systems  Constitutional: Negative for chills, diaphoresis, fatigue and fever.  HENT: Negative for congestion, ear discharge, ear pain, facial swelling, postnasal drip, rhinorrhea, sinus pressure, sinus pain and sore throat.   Eyes: Negative for pain, discharge and itching.  Respiratory: Negative for apnea, cough, chest tightness and shortness of breath.   Cardiovascular: Negative for chest pain.  Gastrointestinal: Negative for diarrhea, nausea and vomiting.  Musculoskeletal: Negative for arthralgias and myalgias.  Skin: Negative for rash.  Allergic/Immunologic: Negative for environmental allergies and food allergies.    Objective:  Physical exam not obtained as encounter was done via telephone.   Previous notes and tests were reviewed.  I discussed the assessment and treatment plan with the patient. The patient was provided an opportunity to ask questions and all were answered.  The patient agreed with the plan and demonstrated an understanding of the instructions.   The patient was advised to call back or seek an in-person evaluation if the symptoms worsen or if the condition fails to improve as anticipated.  I provided 19 minutes of non-face-to-face time during this encounter.  It was my pleasure to participate in Uziel Covault care today. Please feel free to contact me with any questions or concerns.   Sincerely,  Valentina Shaggy, MD

## 2018-09-20 ENCOUNTER — Other Ambulatory Visit (HOSPITAL_COMMUNITY): Payer: PPO

## 2018-09-20 ENCOUNTER — Other Ambulatory Visit (HOSPITAL_COMMUNITY)
Admission: RE | Admit: 2018-09-20 | Discharge: 2018-09-20 | Disposition: A | Discharge status: Home / Self Care | Payer: PPO | Source: Ambulatory Visit | Attending: Internal Medicine | Admitting: Internal Medicine

## 2018-09-20 DIAGNOSIS — Z20828 Contact with and (suspected) exposure to other viral communicable diseases: Secondary | ICD-10-CM | POA: Insufficient documentation

## 2018-09-21 LAB — SARS CORONAVIRUS 2 (TAT 6-24 HRS): SARS Coronavirus 2: NEGATIVE

## 2018-09-24 ENCOUNTER — Encounter (HOSPITAL_COMMUNITY): Payer: Self-pay | Admitting: *Deleted

## 2018-09-24 ENCOUNTER — Ambulatory Visit (HOSPITAL_COMMUNITY): Payer: PPO | Admitting: Anesthesiology

## 2018-09-24 ENCOUNTER — Ambulatory Visit (HOSPITAL_COMMUNITY)
Admission: RE | Admit: 2018-09-24 | Discharge: 2018-09-24 | Disposition: A | Discharge status: Home / Self Care | Payer: PPO | Attending: Internal Medicine | Admitting: Internal Medicine

## 2018-09-24 ENCOUNTER — Encounter (HOSPITAL_COMMUNITY)
Admission: RE | Disposition: A | Discharge status: Home / Self Care | Payer: Self-pay | Source: Home / Self Care | Attending: Internal Medicine

## 2018-09-24 ENCOUNTER — Telehealth: Payer: Self-pay | Admitting: *Deleted

## 2018-09-24 DIAGNOSIS — Z79899 Other long term (current) drug therapy: Secondary | ICD-10-CM | POA: Insufficient documentation

## 2018-09-24 DIAGNOSIS — F419 Anxiety disorder, unspecified: Secondary | ICD-10-CM | POA: Insufficient documentation

## 2018-09-24 DIAGNOSIS — E7800 Pure hypercholesterolemia, unspecified: Secondary | ICD-10-CM | POA: Insufficient documentation

## 2018-09-24 DIAGNOSIS — M1990 Unspecified osteoarthritis, unspecified site: Secondary | ICD-10-CM | POA: Diagnosis not present

## 2018-09-24 DIAGNOSIS — R12 Heartburn: Secondary | ICD-10-CM

## 2018-09-24 DIAGNOSIS — K219 Gastro-esophageal reflux disease without esophagitis: Secondary | ICD-10-CM

## 2018-09-24 DIAGNOSIS — I10 Essential (primary) hypertension: Secondary | ICD-10-CM | POA: Insufficient documentation

## 2018-09-24 DIAGNOSIS — F329 Major depressive disorder, single episode, unspecified: Secondary | ICD-10-CM | POA: Diagnosis not present

## 2018-09-24 DIAGNOSIS — R7303 Prediabetes: Secondary | ICD-10-CM | POA: Insufficient documentation

## 2018-09-24 DIAGNOSIS — Z9889 Other specified postprocedural states: Secondary | ICD-10-CM | POA: Diagnosis not present

## 2018-09-24 HISTORY — PX: ESOPHAGOGASTRODUODENOSCOPY (EGD) WITH PROPOFOL: SHX5813

## 2018-09-24 SURGERY — ESOPHAGOGASTRODUODENOSCOPY (EGD) WITH PROPOFOL
Anesthesia: General

## 2018-09-24 MED ORDER — PROPOFOL 10 MG/ML IV BOLUS
INTRAVENOUS | Status: DC | PRN
  Administered 2018-09-24: 20 mg via INTRAVENOUS
  Administered 2018-09-24: 10 mg via INTRAVENOUS

## 2018-09-24 MED ORDER — KETAMINE HCL 10 MG/ML IJ SOLN
INTRAMUSCULAR | Status: DC | PRN
  Administered 2018-09-24: 20 mg via INTRAVENOUS

## 2018-09-24 MED ORDER — KETAMINE HCL 50 MG/5ML IJ SOSY
PREFILLED_SYRINGE | INTRAMUSCULAR | Status: AC
  Filled 2018-09-24: qty 5

## 2018-09-24 MED ORDER — GLYCOPYRROLATE 0.2 MG/ML IJ SOLN
INTRAMUSCULAR | Status: DC | PRN
  Administered 2018-09-24: 0.2 mg via INTRAVENOUS

## 2018-09-24 MED ORDER — LIDOCAINE HCL (CARDIAC) PF 100 MG/5ML IV SOSY
PREFILLED_SYRINGE | INTRAVENOUS | Status: DC | PRN
  Administered 2018-09-24: 40 mg via INTRAVENOUS

## 2018-09-24 MED ORDER — CHLORHEXIDINE GLUCONATE CLOTH 2 % EX PADS: 6.0000 | MEDICATED_PAD | Freq: Once | CUTANEOUS | Status: DC

## 2018-09-24 MED ORDER — PROPOFOL 500 MG/50ML IV EMUL
INTRAVENOUS | Status: DC | PRN
  Administered 2018-09-24: 150 ug/kg/min via INTRAVENOUS

## 2018-09-24 MED ORDER — LACTATED RINGERS IV SOLN
INTRAVENOUS | Status: DC | PRN
  Administered 2018-09-24: 15:00:00 via INTRAVENOUS

## 2018-09-24 NOTE — Telephone Encounter (Signed)
RMR called and said that he wanted [place] to refer pt to [facility] for an esophageal ph and impedance study.  He also recommends that pt stay on Nexium 40 mg twice a day.  Routing to clinical pool.

## 2018-09-24 NOTE — H&P (Signed)
@LOGO @   Primary Care Physician:  Manon Hilding, MD Primary Gastroenterologist:  Dr. Gala Romney  Pre-Procedure History & Physical: HPI:  [name] Harper is a 58 y.o. male here for further evaluation of refractory GERD.  Currently on Nexium 40 mg twice daily.  Not much improvement in nearly daily reflux symptoms.  No dysphagia.  Past Medical History:  Diagnosis Date  . Anxiety   . Arthritis   . Bronchitis   . Complication of anesthesia   . Depression   . Hypercholesterolemia   . Hypertension   . PONV (postoperative nausea and vomiting)   . Pre-diabetes    pt stated    Past Surgical History:  Procedure Laterality Date  . ABDOMINAL SURGERY  2007   hiatal hernia repair  . BIOPSY  03/28/2017   Procedure: BIOPSY;  Surgeon: Danie Binder, MD;  [facility];  Service: Endoscopy;;  gastric bx's and gastric polyp  . COLONOSCOPY WITH PROPOFOL N/A 03/28/2017   Dr. Oneida Alar: Few small and large mouth diverticula in the rectosigmoid colon, sigmoid colon, descending colon.  External and internal hemorrhoids noted.  One descending colon polyp removed, pathology benign.  Return colonoscopy in 10 years.  . ESOPHAGOGASTRODUODENOSCOPY (EGD) WITH PROPOFOL N/A 03/28/2017   Dr. Oneida Alar: Patchy mild inflammation and erythema in the gastric antrum, negative H. pylori on biopsies.  On biopsy he had chronic gastritis, negative for dysplasia.    Marland Kitchen KNEE ARTHROSCOPY  2016   rt knee  . POLYPECTOMY  03/28/2017   Procedure: POLYPECTOMY;  Surgeon: Danie Binder, MD;  [facility];  Service: Endoscopy;;  descending colon polyp  . TOTAL KNEE ARTHROPLASTY Right 10/29/2014   Procedure: RIGHT TOTAL KNEE ARTHROPLASTY;  Surgeon: Latanya Maudlin, MD;  Location: [facility];  Service: Orthopedics;  Laterality: Right;    Prior to Admission medications   Medication Sig Start Date End Date Taking? Authorizing Provider  citalopram (CELEXA) 40 MG tablet Take 40 mg by mouth daily.   Yes [provider]   clonazePAM (KLONOPIN) 0.5 MG tablet Take 0.5 mg by mouth 3 (three) times daily as needed for anxiety. anxiety 02/10/14  Yes [provider]  esomeprazole (NEXIUM) 40 MG capsule Take 1 capsule (40 mg total) by mouth 2 (two) times daily before a meal. 06/19/18  Yes Rucha Wissinger, Cristopher Estimable, MD  hydrochlorothiazide (HYDRODIURIL) 25 MG tablet Take 25 mg by mouth every morning.  11/25/13  Yes [provider]  mometasone (NASONEX) 50 MCG/ACT nasal spray Place 2 sprays into the nose daily. 09/19/18  Yes Valentina Shaggy, MD  ondansetron [facility]) 4 MG tablet  08/18/18  Yes [provider]  simvastatin (ZOCOR) 20 MG tablet Take 20 mg by mouth at bedtime.  11/12/13  Yes [provider]  azelastine (ASTELIN) 0.1 % nasal spray Place 1-2 sprays into both nostrils 2 (two) times daily. 09/19/18   Valentina Shaggy, MD  EPINEPHrine 0.3 mg/0.3 mL IJ SOAJ injection USE AS DIRECTED FOR A SEVERE ALLERGIC REACTION. 07/21/15   Gean Quint, MD  RABEprazole (ACIPHEX) 20 MG tablet Take 1 tablet (20 mg total) by mouth 2 (two) times daily before a meal. Patient not taking: Reported on 08/14/2018 01/25/18   Mahala Menghini, PA-C    Allergies as of 08/15/2018  . (No Known Allergies)    Family History  Problem Relation Age of Onset  . Lung disease Mother   . Colon cancer Neg Hx     Social History   Socioeconomic  History  . Marital status: Married    Spouse name: Not on file  . Number of children: Not on file  . Years of education: Not on file  . Highest education level: Not on file  Occupational History  . Not on file  Social Needs  . Financial resource strain: Not on file  . Food insecurity    Worry: Not on file    Inability: Not on file  . Transportation needs    Medical: Not on file    Non-medical: Not on file  Tobacco Use  . Smoking status: Never Smoker  . Smokeless tobacco: Never Used  Substance and Sexual Activity  . Alcohol use: No    Alcohol/week: 0.0 standard  drinks  . Drug use: No  . Sexual activity: Yes    Birth control/protection: None  Lifestyle  . Physical activity    Days per week: Not on file    Minutes per session: Not on file  . Stress: Not on file  Relationships  . Social Herbalist on phone: Not on file    Gets together: Not on file    Attends religious service: Not on file    Active member of club or organization: Not on file    Attends meetings of clubs or organizations: Not on file    Relationship status: Not on file  . Intimate partner violence    Fear of current or ex partner: Not on file    Emotionally abused: Not on file    Physically abused: Not on file    Forced sexual activity: Not on file  Other Topics Concern  . Not on file  Social History Narrative  . Not on file    Review of Systems: See HPI, otherwise negative ROS  Physical Exam: BP (!) 142/93   Temp 98.7 F (37.1 C) (Oral)   Resp 20   SpO2 94%  General:   Alert,  Well-developed, well-nourished, pleasant and cooperative in NAD Neck:  Supple; no masses or thyromegaly. No significant cervical adenopathy. Lungs:  Clear throughout to auscultation.   No wheezes, crackles, or rhonchi. No acute distress. Heart:  Regular rate and rhythm; no murmurs, clicks, rubs,  or gallops. Abdomen: Non-distended, normal bowel sounds.  Soft and nontender without appreciable mass or hepatosplenomegaly.  Pulses:  Normal pulses noted. Extremities:  Without clubbing or edema.  Impression/Plan: Pleasant 58 year old gentleman refractory GERD.  History of fundoplication.  EGD now being done to reassess upper GI tract and in particular, assess competency wrap.  Denies dysphagia.  The risks, benefits, limitations, alternatives and imponderables have been reviewed with the patient. Potential for esophageal dilation, biopsy, etc. have also been reviewed.  Questions have been answered. All parties agreeable.     Notice: This dictation was prepared with Dragon dictation  along with smaller phrase technology. Any transcriptional errors that result from this process are unintentional and may not be corrected upon review.

## 2018-09-24 NOTE — Anesthesia Procedure Notes (Signed)
Procedure Name: General with mask airway Date/Time: 09/24/2018 3:08 PM Performed by: Andree Elk, Denise Bramblett A, CRNA Pre-anesthesia Checklist: Timeout performed, Patient being monitored, Suction available, Emergency Drugs available and Patient identified Oxygen Delivery Method: Non-rebreather mask

## 2018-09-24 NOTE — Anesthesia Preprocedure Evaluation (Signed)
Anesthesia Evaluation  Patient identified by MRN, date of birth, ID band Patient awake    History of Anesthesia Complications (+) PONV and history of anesthetic complications  Airway Mallampati: III   Neck ROM: Full   Comment: Difficult airway  Dental  (+) Missing,  As per patient, he still has 3 baby teeth:   Pulmonary neg pulmonary ROS,  OSA?   breath sounds clear to auscultation       Cardiovascular Exercise Tolerance: Good hypertension, Pt. on medications  Rhythm:Regular Rate:Normal     Neuro/Psych PSYCHIATRIC DISORDERS Anxiety Depression negative neurological ROS     GI/Hepatic GERD  Medicated,Fatty liver   Endo/Other    Renal/GU   negative genitourinary   Musculoskeletal  (+) Arthritis ,   Abdominal   Peds negative pediatric ROS (+)  Hematology   Anesthesia Other Findings Hyper cholesterolemia  Reproductive/Obstetrics                             Anesthesia Physical Anesthesia Plan  ASA: III  Anesthesia Plan: General   Post-op Pain Management:    Induction: Intravenous  PONV Risk Score and Plan: TIVA  Airway Management Planned: Nasal Cannula, Mask and Natural Airway  Additional Equipment:   Intra-op Plan:   Post-operative Plan:   Informed Consent: I have reviewed the patients History and Physical, chart, labs and discussed the procedure including the risks, benefits and alternatives for the proposed anesthesia with the patient or authorized representative who has indicated his/her understanding and acceptance.       Plan Discussed with: CRNA  Anesthesia Plan Comments:         Anesthesia Quick Evaluation

## 2018-09-24 NOTE — Anesthesia Postprocedure Evaluation (Signed)
Anesthesia Post Note  Patient: [name] Harper  Procedure(s) Performed: ESOPHAGOGASTRODUODENOSCOPY (EGD) WITH PROPOFOL (N/A )  Patient location during evaluation: PACU Anesthesia Type: General Level of consciousness: awake and alert and oriented Pain management: pain level controlled Respiratory status: spontaneous breathing Cardiovascular status: stable Postop Assessment: no apparent nausea or vomiting Anesthetic complications: no     Last Vitals:  Vitals:   09/24/18 1356  BP: (!) 142/93  Resp: 20  Temp: 37.1 C  SpO2: 94%    Last Pain:  Vitals:   09/24/18 1511  TempSrc:   PainSc: 0-No pain                 Mirca Yale A

## 2018-09-24 NOTE — Discharge Instructions (Addendum)
Upper Endoscopy, Adult, Care After This sheet gives you information about how to care for yourself after your procedure. Your health care provider may also give you more specific instructions. If you have problems or questions, contact your health care provider. What can I expect after the procedure? After the procedure, it is common to have:  A sore throat.  Mild stomach pain or discomfort.  Bloating.  Nausea. Follow these instructions at home:   Follow instructions from your health care provider about what to eat or drink after your procedure.  Return to your normal activities as told by your health care provider. Ask your health care provider what activities are safe for you.  Take over-the-counter and prescription medicines only as told by your health care provider.  Do not drive for 24 hours if you were given a sedative during your procedure.  Keep all follow-up visits as told by your health care provider. This is important. Contact a health care provider if you have:  A sore throat that lasts longer than one day.  Trouble swallowing. Get help right away if:  You vomit blood or your vomit looks like coffee grounds.  You have: ? A fever. ? Bloody, black, or tarry stools. ? A severe sore throat or you cannot swallow. ? Difficulty breathing. ? Severe pain in your chest or abdomen. Summary  After the procedure, it is common to have a sore throat, mild stomach discomfort, bloating, and nausea.  Do not drive for 24 hours if you were given a sedative during the procedure.  Follow instructions from your health care provider about what to eat or drink after your procedure.  Return to your normal activities as told by your health care provider. This information is not intended to replace advice given to you by your health care provider. Make sure you discuss any questions you have with your health care provider. Document Released: 08/09/2011 Document Revised: 08/01/2017  Document Reviewed: 07/10/2017 Elsevier Patient Education  [place]. EGD Discharge instructions Please read the instructions outlined below and refer to this sheet in the next few weeks. These discharge instructions provide you with general information on caring for yourself after you leave the hospital. Your doctor may also give you specific instructions. While your treatment has been planned according to the most current medical practices available, unavoidable complications occasionally occur. If you have any problems or questions after discharge, please call your doctor. ACTIVITY  You may resume your regular activity but move at a slower pace for the next 24 hours.   Take frequent rest periods for the next 24 hours.   Walking will help expel (get rid of) the air and reduce the bloated feeling in your abdomen.   No driving for 24 hours (because of the anesthesia (medicine) used during the test).   You may shower.   Do not sign any important legal documents or operate any machinery for 24 hours (because of the anesthesia used during the test).  NUTRITION  Drink plenty of fluids.   You may resume your normal diet.   Begin with a light meal and progress to your normal diet.   Avoid alcoholic beverages for 24 hours or as instructed by your caregiver.  MEDICATIONS  You may resume your normal medications unless your caregiver tells you otherwise.  WHAT YOU CAN EXPECT TODAY  You may experience abdominal discomfort such as a feeling of fullness or gas pains.  FOLLOW-UP  Your doctor will discuss the results of your  test with you.  SEEK IMMEDIATE MEDICAL ATTENTION IF ANY OF THE FOLLOWING OCCUR:  Excessive nausea (feeling sick to your stomach) and/or vomiting.   Severe abdominal pain and distention (swelling).   Trouble swallowing.   Temperature over 101 F (37.8 C).   Rectal bleeding or vomiting of blood.    Your antireflux wrap related to prior surgery is intact  and looks good.  Your esophagus and stomach appear normal otherwise.  I do not see an explanation for your reflux symptoms. It may be that you have nonacidic reflux coming out of your esophagus.  Alternatively, your symptoms may not be related to reflux at all.  .  You need to go ahead and have a test called an ambulatory esophageal pH/impedance study in [place] to determine if any of your symptoms are related to reflux or not.  You should have this study done while taking Nexium 40 mg twice daily.  We will set that up for you in the near future.  I discussed my findings and recommendations with the patient's stepdaughter, Iona Coach, at his request.  [phone]

## 2018-09-24 NOTE — Transfer of Care (Signed)
Immediate Anesthesia Transfer of Care Note  Patient: [name] Harper  Procedure(s) Performed: ESOPHAGOGASTRODUODENOSCOPY (EGD) WITH PROPOFOL (N/A )  Patient Location: PACU  Anesthesia Type:General  Level of Consciousness: awake, alert , oriented and patient cooperative  Airway & Oxygen Therapy: Patient Spontanous Breathing  Post-op Assessment: Report given to RN and Post -op Vital signs reviewed and stable  Post vital signs: Reviewed and stable  Last Vitals:  Vitals Value Taken Time  BP    Temp    Pulse 52 09/24/18 1530  Resp 19 09/24/18 1530  SpO2 81 % 09/24/18 1530  Vitals shown include unvalidated device data.  Last Pain:  Vitals:   09/24/18 1511  TempSrc:   PainSc: 0-No pain      Patients Stated Pain Goal: 8 (00/45/99 7741)  Complications: No apparent anesthesia complications

## 2018-09-24 NOTE — Op Note (Signed)
[facility] Patient Name: [name] Harper Procedure Date: 09/24/2018 3:04 PM MRN: [id number] Date of Birth: 1960/09/24 Attending MD: Norvel Richards , MD CSN: [id number] Age: 58 Admit Type: Outpatient Procedure:                Upper GI endoscopy Indications:              Refractory Heartburn Providers:                Norvel Richards, MD, Jeanann Lewandowsky. Sharon Seller, RN,                            Nelma Rothman, Technician Referring MD:              Medicines:                Propofol per Anesthesia Complications:            No immediate complications. Estimated Blood Loss:     Estimated blood loss: none. Procedure:                Pre-Anesthesia Assessment:                           - Prior to the procedure, a History and Physical                            was performed, and patient medications and                            allergies were reviewed. The patient's tolerance of                            previous anesthesia was also reviewed. The risks                            and benefits of the procedure and the sedation                            options and risks were discussed with the patient.                            All questions were answered, and informed consent                            was obtained. Prior Anticoagulants: The patient has                            taken no previous anticoagulant or antiplatelet                            agents. ASA Grade Assessment: II - A patient with                            mild systemic disease. After reviewing the risks  and benefits, the patient was deemed in                            satisfactory condition to undergo the procedure.                           After obtaining informed consent, the endoscope was                            passed under direct vision. Throughout the                            procedure, the patient's blood pressure, pulse, and                            oxygen saturations  were monitored continuously. The                            [id number]-H190 ([id number]) scope was introduced through the                            mouth, and advanced to the second part of duodenum.                            The upper GI endoscopy was accomplished without                            difficulty. The patient tolerated the procedure                            well. Scope In: 3:18:24 PM Scope Out: 3:22:40 PM Total Procedure Duration: 0 hours 4 minutes 16 seconds  Findings:      The examined esophagus was normal aside from an accentuated undulating Z       line.      The entire examined stomach was normal. Cardia/GE junction seen well       retroflex. Nice intact, appropriately snug, fundoplication found      The duodenal bulb and second portion of the duodenum were normal. Impression:               - Normal esophagus.                           - Normal stomach. Intact fundoplication                           - Normal duodenal bulb and second portion of the                            duodenum.                           - No specimens collected.                           Patient has a history of short lived  improvement in                            his perceived reflux symptoms after switching from                            one PPI to another. Most recently Nexium 40 mg                            orally daily helped significantly with his symptoms                            but then they have recurred. At this point I am not                            convinced that his symptoms are truly related to                            acid reflux. I feel we need to go ahead and follow                            through with an ambulatory pH/impedance study while                            on twice daily PPI therapy to assess for any acidic                            or nonacidic reflux which may correlate with his                            symptoms to complete the GI evaluation..                            We will arrange to have this done in [place] in                            the near future. This procedure should be done                            while on high-dose acid suppression therapy. Moderate Sedation:      Moderate (conscious) sedation was personally administered by an       anesthesia professional. The following parameters were monitored: oxygen       saturation, heart rate, blood pressure, respiratory rate, EKG, adequacy       of pulmonary ventilation, and response to care. Recommendation:           - Patient has a contact number available for                            emergencies. The signs and symptoms of potential                            delayed complications  were discussed with the                            patient. Return to normal activities tomorrow.                            Written discharge instructions were provided to the                            patient.                           - Advance diet as tolerated.                           - Continue present medications. Procedure Code(s):        --- Professional ---                           [phone], Esophagogastroduodenoscopy, flexible,                            transoral; diagnostic, including collection of                            specimen(s) by brushing or washing, when performed                            (separate procedure) Diagnosis Code(s):        --- Professional ---                           R12, Heartburn CPT copyright 2019 American Medical Association. All rights reserved. The codes documented in this report are preliminary and upon coder review may  be revised to meet current compliance requirements. Cristopher Estimable. Larenda Reedy, MD Norvel Richards, MD 09/24/2018 3:52:34 PM This report has been signed electronically. Number of Addenda: 0

## 2018-09-25 ENCOUNTER — Telehealth: Payer: Self-pay | Admitting: Internal Medicine

## 2018-09-25 NOTE — Telephone Encounter (Signed)
Referral sent 

## 2018-09-25 NOTE — Telephone Encounter (Signed)
Records arrived and are in RMR office

## 2018-09-25 NOTE — Telephone Encounter (Signed)
I have sent a request for medical records on patient and marked as continuity of care and ASAP. I also mailed patient a release to sign and mail back to [place]. I am trying to get operative notes for reflux done about 10-13 years ago.

## 2018-09-25 NOTE — Addendum Note (Signed)
Addended by: Inge Rise on: 09/25/2018 07:41 AM   Modules accepted: Orders

## 2018-09-26 ENCOUNTER — Telehealth: Payer: Self-pay | Admitting: Gastroenterology

## 2018-09-26 ENCOUNTER — Other Ambulatory Visit: Payer: Self-pay

## 2018-09-26 DIAGNOSIS — K219 Gastro-esophageal reflux disease without esophagitis: Secondary | ICD-10-CM

## 2018-09-26 DIAGNOSIS — Z1159 Encounter for screening for other viral diseases: Secondary | ICD-10-CM

## 2018-09-26 NOTE — Telephone Encounter (Signed)
Hi [name] Harper, we received a referral from Dr. Gala Romney. The reason is not very clear, I will send you the referral shortly, could you please take a look and advise on scheduling? Thank you.

## 2018-09-26 NOTE — Telephone Encounter (Signed)
Patient is being referred for esophageal manometry and ph impedance study. Spoke with the patient. He is unable to discuss this and requests a call back tomorrow.

## 2018-09-27 NOTE — Telephone Encounter (Signed)
I have spoken with the patient and the daughter (step).  COVID testing will take place at [facility] site on 10/05/18. He will arrive to [place] 10/10/18 at 12:00 pm fasting. Continue all medications.

## 2018-10-01 ENCOUNTER — Telehealth: Payer: Self-pay | Admitting: Internal Medicine

## 2018-10-01 NOTE — Telephone Encounter (Signed)
Pt requested to have his labs sent to his PCP. I have cc'ed his July labs. Pt had other questions for the nurse. [phone]

## 2018-10-02 NOTE — Telephone Encounter (Signed)
Pt is aware we sent labs to PCP and he is aware of his Manometry on 10/10/2018.  He said he did not have any other questions.

## 2018-10-04 DIAGNOSIS — F411 Generalized anxiety disorder: Secondary | ICD-10-CM | POA: Diagnosis not present

## 2018-10-04 DIAGNOSIS — E782 Mixed hyperlipidemia: Secondary | ICD-10-CM | POA: Diagnosis not present

## 2018-10-04 DIAGNOSIS — E291 Testicular hypofunction: Secondary | ICD-10-CM | POA: Diagnosis not present

## 2018-10-04 DIAGNOSIS — F5221 Male erectile disorder: Secondary | ICD-10-CM | POA: Diagnosis not present

## 2018-10-04 DIAGNOSIS — I10 Essential (primary) hypertension: Secondary | ICD-10-CM | POA: Diagnosis not present

## 2018-10-04 DIAGNOSIS — Z6836 Body mass index (BMI) 36.0-36.9, adult: Secondary | ICD-10-CM | POA: Diagnosis not present

## 2018-10-04 DIAGNOSIS — R945 Abnormal results of liver function studies: Secondary | ICD-10-CM | POA: Diagnosis not present

## 2018-10-04 DIAGNOSIS — E119 Type 2 diabetes mellitus without complications: Secondary | ICD-10-CM | POA: Diagnosis not present

## 2018-10-05 ENCOUNTER — Other Ambulatory Visit: Payer: Self-pay

## 2018-10-05 ENCOUNTER — Other Ambulatory Visit (HOSPITAL_COMMUNITY)
Admission: RE | Admit: 2018-10-05 | Discharge: 2018-10-05 | Disposition: A | Discharge status: Home / Self Care | Payer: PPO | Source: Ambulatory Visit | Attending: Gastroenterology | Admitting: Gastroenterology

## 2018-10-05 ENCOUNTER — Encounter (HOSPITAL_COMMUNITY): Payer: Self-pay | Admitting: Internal Medicine

## 2018-10-09 NOTE — Progress Notes (Signed)
Spoke with patient.  States did go to [facility] for covid testing on 8/14. Awaiting results.  Patient aware to be NPO after MN.  Will arrive at 1200 on 10/10/18 for manometry.

## 2018-10-09 NOTE — Progress Notes (Signed)
Results for Covid 19 test still no in system.  Contacted Covid 19 Testing center at [facility] at [phone].  They state patient has not shown for testing.  [place] office, spoke with Glass blower/designer.  Office to contact patient to cancel procedure and reschedule.

## 2018-10-10 ENCOUNTER — Other Ambulatory Visit: Payer: Self-pay | Admitting: *Deleted

## 2018-10-10 ENCOUNTER — Ambulatory Visit (HOSPITAL_COMMUNITY)
Admission: RE | Admit: 2018-10-10 | Discharge: 2018-10-10 | Disposition: A | Discharge status: Home / Self Care | Payer: PPO | Attending: Gastroenterology | Admitting: Gastroenterology

## 2018-10-10 ENCOUNTER — Encounter (HOSPITAL_COMMUNITY)
Admission: RE | Disposition: A | Discharge status: Home / Self Care | Payer: Self-pay | Source: Home / Self Care | Attending: Gastroenterology

## 2018-10-10 ENCOUNTER — Other Ambulatory Visit (HOSPITAL_COMMUNITY)
Admission: RE | Admit: 2018-10-10 | Discharge: 2018-10-10 | Disposition: A | Discharge status: Home / Self Care | Payer: PPO | Source: Ambulatory Visit | Attending: Gastroenterology | Admitting: Gastroenterology

## 2018-10-10 ENCOUNTER — Telehealth: Payer: Self-pay | Admitting: *Deleted

## 2018-10-10 ENCOUNTER — Other Ambulatory Visit (HOSPITAL_COMMUNITY): Payer: Self-pay

## 2018-10-10 ENCOUNTER — Ambulatory Visit (HOSPITAL_COMMUNITY): Admission: RE | Admit: 2018-10-10 | Payer: PPO | Source: Home / Self Care | Admitting: Gastroenterology

## 2018-10-10 ENCOUNTER — Encounter (HOSPITAL_COMMUNITY): Admission: RE | Payer: Self-pay | Source: Home / Self Care

## 2018-10-10 DIAGNOSIS — R1110 Vomiting, unspecified: Secondary | ICD-10-CM | POA: Diagnosis not present

## 2018-10-10 DIAGNOSIS — Z9889 Other specified postprocedural states: Secondary | ICD-10-CM | POA: Diagnosis not present

## 2018-10-10 DIAGNOSIS — R05 Cough: Secondary | ICD-10-CM | POA: Insufficient documentation

## 2018-10-10 DIAGNOSIS — Z20828 Contact with and (suspected) exposure to other viral communicable diseases: Secondary | ICD-10-CM | POA: Insufficient documentation

## 2018-10-10 DIAGNOSIS — R110 Nausea: Secondary | ICD-10-CM | POA: Insufficient documentation

## 2018-10-10 DIAGNOSIS — K219 Gastro-esophageal reflux disease without esophagitis: Secondary | ICD-10-CM

## 2018-10-10 DIAGNOSIS — R112 Nausea with vomiting, unspecified: Secondary | ICD-10-CM | POA: Diagnosis not present

## 2018-10-10 DIAGNOSIS — K222 Esophageal obstruction: Secondary | ICD-10-CM | POA: Insufficient documentation

## 2018-10-10 HISTORY — PX: ESOPHAGEAL MANOMETRY: SHX5429

## 2018-10-10 HISTORY — PX: PH IMPEDANCE STUDY: SHX5565

## 2018-10-10 LAB — SARS CORONAVIRUS 2 BY RT PCR (HOSPITAL ORDER, PERFORMED IN ~~LOC~~ HOSPITAL LAB): SARS Coronavirus 2: NEGATIVE

## 2018-10-10 SURGERY — MANOMETRY, ESOPHAGUS

## 2018-10-10 MED ORDER — LIDOCAINE VISCOUS HCL 2 % MT SOLN
OROMUCOSAL | Status: AC
  Filled 2018-10-10: qty 15

## 2018-10-10 SURGICAL SUPPLY — 2 items
FACESHIELD LNG OPTICON STERILE (SAFETY) IMPLANT
GLOVE BIO SURGEON STRL SZ8 (GLOVE) ×6 IMPLANT

## 2018-10-10 NOTE — Progress Notes (Signed)
Esophageal manometry performed per protocol.  Patient tolerated well.  PH probe study performed per protocol.  Patient tolerated well. Probed placed at 35 cm. Patient education given.  Patient verbalize understanding.  Patient to return for probe removal 10/11/18 at 1340.

## 2018-10-10 NOTE — Telephone Encounter (Signed)
Spoke with granddaughter and explained patient will need rapid COVID test done this morning at [address] for his procedure at 12:30 this morning, granddaughter is actually at [employer] and her aunt will be taking him for his appointment, called the aunt Isaias Sakai at [phone] and explained to her to have patient at the testing site early this morning so we can be sure we have the results back in time for his mano study today ... Barb Merino also called [facility] to be sure that they do the Rapid on the patient this morning

## 2018-10-11 ENCOUNTER — Encounter (HOSPITAL_COMMUNITY): Payer: Self-pay | Admitting: Gastroenterology

## 2018-10-18 DIAGNOSIS — R1110 Vomiting, unspecified: Secondary | ICD-10-CM

## 2018-10-19 ENCOUNTER — Telehealth: Payer: Self-pay | Admitting: Gastroenterology

## 2018-10-22 NOTE — Telephone Encounter (Signed)
Routing to RMR. Pt is inquiring about his Manometry results.

## 2018-10-22 NOTE — Telephone Encounter (Signed)
Forwarding to Am, this is RMR pt.

## 2018-10-22 NOTE — Telephone Encounter (Signed)
Would you help this patient, please. The results are routed to his primary GI. Thank you

## 2018-10-24 ENCOUNTER — Other Ambulatory Visit: Payer: Self-pay

## 2018-10-24 DIAGNOSIS — K219 Gastro-esophageal reflux disease without esophagitis: Secondary | ICD-10-CM

## 2018-11-16 ENCOUNTER — Other Ambulatory Visit: Payer: Self-pay

## 2018-11-16 DIAGNOSIS — R945 Abnormal results of liver function studies: Secondary | ICD-10-CM

## 2018-11-16 DIAGNOSIS — R7989 Other specified abnormal findings of blood chemistry: Secondary | ICD-10-CM

## 2018-12-19 DIAGNOSIS — R945 Abnormal results of liver function studies: Secondary | ICD-10-CM | POA: Diagnosis not present

## 2018-12-19 LAB — HEPATIC FUNCTION PANEL
AG Ratio: 1.6 (calc) (ref 1.0–2.5)
ALT: 44 U/L (ref 9–46)
AST: 42 U/L — ABNORMAL HIGH (ref 10–35)
Albumin: 4.5 g/dL (ref 3.6–5.1)
Alkaline phosphatase (APISO): 80 U/L (ref 35–144)
Bilirubin, Direct: 0.1 mg/dL (ref 0.0–0.2)
Globulin: 2.8 g/dL (calc) (ref 1.9–3.7)
Indirect Bilirubin: 0.6 mg/dL (calc) (ref 0.2–1.2)
Total Bilirubin: 0.7 mg/dL (ref 0.2–1.2)
Total Protein: 7.3 g/dL (ref 6.1–8.1)

## 2018-12-21 DIAGNOSIS — I10 Essential (primary) hypertension: Secondary | ICD-10-CM | POA: Diagnosis not present

## 2018-12-21 DIAGNOSIS — E119 Type 2 diabetes mellitus without complications: Secondary | ICD-10-CM | POA: Diagnosis not present

## 2018-12-31 DIAGNOSIS — K219 Gastro-esophageal reflux disease without esophagitis: Secondary | ICD-10-CM | POA: Diagnosis not present

## 2018-12-31 DIAGNOSIS — Z79899 Other long term (current) drug therapy: Secondary | ICD-10-CM | POA: Diagnosis not present

## 2018-12-31 DIAGNOSIS — R05 Cough: Secondary | ICD-10-CM | POA: Diagnosis not present

## 2019-01-21 DIAGNOSIS — E119 Type 2 diabetes mellitus without complications: Secondary | ICD-10-CM | POA: Diagnosis not present

## 2019-01-21 DIAGNOSIS — I10 Essential (primary) hypertension: Secondary | ICD-10-CM | POA: Diagnosis not present

## 2019-01-29 DIAGNOSIS — E782 Mixed hyperlipidemia: Secondary | ICD-10-CM | POA: Diagnosis not present

## 2019-01-29 DIAGNOSIS — E119 Type 2 diabetes mellitus without complications: Secondary | ICD-10-CM | POA: Diagnosis not present

## 2019-01-29 DIAGNOSIS — I10 Essential (primary) hypertension: Secondary | ICD-10-CM | POA: Diagnosis not present

## 2019-01-29 DIAGNOSIS — R945 Abnormal results of liver function studies: Secondary | ICD-10-CM | POA: Diagnosis not present

## 2019-01-29 DIAGNOSIS — E7800 Pure hypercholesterolemia, unspecified: Secondary | ICD-10-CM | POA: Diagnosis not present

## 2019-02-01 DIAGNOSIS — Z23 Encounter for immunization: Secondary | ICD-10-CM | POA: Diagnosis not present

## 2019-02-01 DIAGNOSIS — Z1331 Encounter for screening for depression: Secondary | ICD-10-CM | POA: Diagnosis not present

## 2019-02-01 DIAGNOSIS — E291 Testicular hypofunction: Secondary | ICD-10-CM | POA: Diagnosis not present

## 2019-02-01 DIAGNOSIS — I10 Essential (primary) hypertension: Secondary | ICD-10-CM | POA: Diagnosis not present

## 2019-02-01 DIAGNOSIS — R945 Abnormal results of liver function studies: Secondary | ICD-10-CM | POA: Diagnosis not present

## 2019-02-01 DIAGNOSIS — Z6836 Body mass index (BMI) 36.0-36.9, adult: Secondary | ICD-10-CM | POA: Diagnosis not present

## 2019-02-01 DIAGNOSIS — E119 Type 2 diabetes mellitus without complications: Secondary | ICD-10-CM | POA: Diagnosis not present

## 2019-02-01 DIAGNOSIS — Z1389 Encounter for screening for other disorder: Secondary | ICD-10-CM | POA: Diagnosis not present

## 2019-02-21 DIAGNOSIS — I10 Essential (primary) hypertension: Secondary | ICD-10-CM | POA: Diagnosis not present

## 2019-02-21 DIAGNOSIS — E782 Mixed hyperlipidemia: Secondary | ICD-10-CM | POA: Diagnosis not present

## 2019-03-20 ENCOUNTER — Other Ambulatory Visit: Payer: Self-pay

## 2019-03-20 MED ORDER — AZELASTINE HCL 0.1 % NA SOLN: 1.0000 | Freq: Two times a day (BID) | NASAL | 0 refills | Status: DC

## 2019-03-20 MED ORDER — MOMETASONE FUROATE 50 MCG/ACT NA SUSP: 2.0000 | Freq: Every day | NASAL | 0 refills | Status: DC

## 2019-03-22 ENCOUNTER — Encounter: Payer: Self-pay | Admitting: Allergy & Immunology

## 2019-03-22 ENCOUNTER — Other Ambulatory Visit: Payer: Self-pay

## 2019-03-22 ENCOUNTER — Ambulatory Visit (INDEPENDENT_AMBULATORY_CARE_PROVIDER_SITE_OTHER): Payer: PPO | Admitting: Allergy & Immunology

## 2019-03-22 DIAGNOSIS — E119 Type 2 diabetes mellitus without complications: Secondary | ICD-10-CM | POA: Diagnosis not present

## 2019-03-22 DIAGNOSIS — J3089 Other allergic rhinitis: Secondary | ICD-10-CM | POA: Diagnosis not present

## 2019-03-22 DIAGNOSIS — K219 Gastro-esophageal reflux disease without esophagitis: Secondary | ICD-10-CM | POA: Diagnosis not present

## 2019-03-22 DIAGNOSIS — J302 Other seasonal allergic rhinitis: Secondary | ICD-10-CM | POA: Diagnosis not present

## 2019-03-22 DIAGNOSIS — I10 Essential (primary) hypertension: Secondary | ICD-10-CM | POA: Diagnosis not present

## 2019-03-22 MED ORDER — IPRATROPIUM BROMIDE 0.06 % NA SOLN: 2.0000 | Freq: Four times a day (QID) | NASAL | 3 refills | Status: DC | PRN

## 2019-03-22 MED ORDER — MOMETASONE FUROATE 50 MCG/ACT NA SUSP: 2.0000 | Freq: Every day | NASAL | 3 refills | Status: DC

## 2019-03-22 NOTE — Progress Notes (Signed)
RE: [name] Harper MRN: [id number] DOB: Jun 09, 1960 Date of Telemedicine Visit: 03/22/2019  Referring provider: Manon Hilding, MD Primary care provider: Manon Hilding, MD  Chief Complaint: Allergic Rhinitis  (sinus is getting worse, spray helps temporary ) and Cough (post nasal drip, causing N&V, vomiting blood )   Telemedicine Follow Up Visit via Telephone: I connected with [name] for a follow up on 03/22/19 by telephone and verified that I am speaking with the correct person using two identifiers.   I discussed the limitations, risks, security and privacy concerns of performing an evaluation and management service by telephone and the availability of in person appointments. I also discussed with the patient that there may be a patient responsible charge related to this service. The patient expressed understanding and agreed to proceed.  Patient is at home.  Provider is at the office.  Visit start time: 10:41 AM Visit end time: 11:10 AM Insurance consent/check in by: [facility] consent and medical assistant/nurse: Airline pilot  History of Present Illness:  He is a 59 y.o. male, who is being followed for perennial and seasonal allergic rhinitis as well as reflux. His previous allergy office visit was in July 2020 with myself.  He was on allergy shots for around 3 years and stopped them in November 2018.  At the last visit, we continued with mometasone 1 spray per nostril daily as well as Astelin 1 spray per nostril twice daily as needed.  We also continue with Xyzal 5 mg daily.  For his reflux, we recommended continued follow-up with Dr. Gala Romney.   In the interim, his wife passed away in 2023-02-19. She had three MIs in three months.  He does spend a lot of time discussing this today, and he does admit that she was in pretty terrible health.  He seems to have a lot of family support nearby and despite the recent passing of his wife, he has been fairly good spirits.  He reports that he has  been having more sinus and allergy issues. He needs refills on his nasal sprays. He is having a lot of nausea in the morning. He reports having a lot phlegm coming out of him. He reports that he vomits every morning with a lot of phlegm.  He does feel that his symptoms have worsened since stopping his allergen immunotherapy.  He does not remember the last time he was tested.  It is not in our system, so must of been prior to 2016 when we got onto Epic.  He apparently had an endoscopy and his stomach was "normal" per the patient.   Otherwise, there have been no changes to his past medical history, surgical history, family history, or social history.  Assessment and Plan:  [name] is a 59 y.o. male with:  Gastroesophageal reflux disease- on Protonix twice daily  Seasonal and perennial allergic rhinitis(grasses, weeds, trees, molds, DM, dog, cat, cockroach)  Recent passing of his wife   Mr. [name] Harper is having worsening postnasal drip and nausea and vomiting secondary to this.  We are going to go ahead and add on a different nasal spray to help dry out his nasal passages.  I think it would be helpful to retest his environmental allergies as well since it has been so long.  He might need to restart allergy shots since he seemed to be better controlled when he was on them.  He is open to this idea.   Diagnostics: None.  Medication List:  Current Outpatient  Medications  Medication Sig Dispense Refill  . azelastine (ASTELIN) 0.1 % nasal spray Place 1-2 sprays into both nostrils 2 (two) times daily. 30 mL 0  . citalopram (CELEXA) 40 MG tablet Take 40 mg by mouth daily.    . clonazePAM (KLONOPIN) 0.5 MG tablet Take 0.5 mg by mouth 3 (three) times daily as needed for anxiety. anxiety    . EPINEPHrine 0.3 mg/0.3 mL IJ SOAJ injection USE AS DIRECTED FOR A SEVERE ALLERGIC REACTION. 2 Device 2  . esomeprazole (NEXIUM) 40 MG capsule Take 1 capsule (40 mg total) by mouth 2 (two) times daily before a  meal. 60 capsule 3  . hydrochlorothiazide (HYDRODIURIL) 25 MG tablet Take 25 mg by mouth every morning.     . mometasone (NASONEX) 50 MCG/ACT nasal spray Place 2 sprays into the nose daily. 17 g 0  . ondansetron (ZOFRAN) 4 MG tablet     . simvastatin (ZOCOR) 20 MG tablet Take 20 mg by mouth at bedtime.      No current facility-administered medications for this visit.   Facility-Administered Medications Ordered in Other Visits  Medication Dose Route Frequency Provider Last Rate Last Admin  . bupivacaine liposome (EXPAREL) 1.3 % injection 266 mg  20 mL Infiltration Once [place], [employer], PA-C       Allergies: No Known Allergies I reviewed his past medical history, social history, family history, and environmental history and no significant changes have been reported from previous visits.  Review of Systems  Constitutional: Negative for chills, diaphoresis, fatigue and fever.  HENT: Positive for congestion, postnasal drip and rhinorrhea. Negative for ear discharge, ear pain, facial swelling, sinus pressure, sinus pain and sore throat.   Eyes: Negative for pain, discharge, redness and itching.  Respiratory: Positive for cough and choking. Negative for apnea, chest tightness and shortness of breath.   Cardiovascular: Negative for chest pain.  Gastrointestinal: Negative for diarrhea and nausea.  Musculoskeletal: Negative for arthralgias and myalgias.  Skin: Negative for rash.  Allergic/Immunologic: Negative for environmental allergies and food allergies.    Objective:  Physical exam not obtained as encounter was done via telephone.   Previous notes and tests were reviewed.  I discussed the assessment and treatment plan with the patient. The patient was provided an opportunity to ask questions and all were answered. The patient agreed with the plan and demonstrated an understanding of the instructions.   The patient was advised to call back or seek an in-person evaluation if the symptoms  worsen or if the condition fails to improve as anticipated.  I provided 29 minutes of non-face-to-face time during this encounter.  It was my pleasure to participate in Teigen Borello care today. Please feel free to contact me with any questions or concerns.   Sincerely,  Valentina Shaggy, MD

## 2019-04-10 ENCOUNTER — Other Ambulatory Visit: Payer: Self-pay

## 2019-04-10 ENCOUNTER — Encounter: Payer: Self-pay | Admitting: Allergy & Immunology

## 2019-04-10 ENCOUNTER — Ambulatory Visit (INDEPENDENT_AMBULATORY_CARE_PROVIDER_SITE_OTHER): Payer: PPO | Admitting: Allergy & Immunology

## 2019-04-10 VITALS — BP 132/78 | HR 100 | Temp 98.2°F | Resp 20

## 2019-04-10 DIAGNOSIS — J3089 Other allergic rhinitis: Secondary | ICD-10-CM | POA: Diagnosis not present

## 2019-04-10 DIAGNOSIS — K219 Gastro-esophageal reflux disease without esophagitis: Secondary | ICD-10-CM | POA: Diagnosis not present

## 2019-04-10 DIAGNOSIS — R059 Cough, unspecified: Secondary | ICD-10-CM

## 2019-04-10 DIAGNOSIS — J302 Other seasonal allergic rhinitis: Secondary | ICD-10-CM

## 2019-04-10 DIAGNOSIS — R05 Cough: Secondary | ICD-10-CM

## 2019-04-10 MED ORDER — MOMETASONE FUROATE 50 MCG/ACT NA SUSP: 2.0000 | Freq: Every day | NASAL | 5 refills | Status: DC

## 2019-04-10 MED ORDER — LEVOCETIRIZINE DIHYDROCHLORIDE 5 MG PO TABS: 5.0000 mg | ORAL_TABLET | Freq: Every evening | ORAL | 5 refills | Status: DC

## 2019-04-10 MED ORDER — IPRATROPIUM BROMIDE 0.06 % NA SOLN: 2.0000 | Freq: Four times a day (QID) | NASAL | 5 refills | Status: DC | PRN

## 2019-04-10 NOTE — Progress Notes (Signed)
FOLLOW UP  Date of Service/Encounter:  04/10/19   Assessment:   Seasonal and perennial allergic rhinitis (horse, grasses, ragweed, weeds, trees, indoor molds, dust mites, cat, dog and cockroach)  Gastroesophageal reflux disease  Cough - likely postnasal drip   [name] Harper presents for retesting. He actually stopped his allergy shots a couple of years ago.  He had been on shots for approximately 3 or 4 years, started by Dr. Ishmael Holter.  However, around 2019 he felt that they were not providing any relief so he stopped.  In the intervening 18 months or so, he has been evaluated by gastroenterology and has been placed on [name] Harper for his reflux.  This seemed to control his symptoms for a while but then I talked to him approximately 3 weeks ago and he was having worsening congestion and postnasal drip with vomiting.  Therefore, we added ipratropium to his nasal regimen and had him come in for repeat testing.  He was open to starting allergy injections again.   Plan/Recommendations:   1. Seasonal and perennial allergic rhinitis - Testing today showed: horse, grasses, ragweed, weeds, trees, indoor molds, dust mites, cat, dog and cockroach - Copy of test results provided.  - Avoidance measures provided. - Continue with: nasal ipratropium and Xyzal 5mg   - We will re mix your allergy vials and you can start in three weeks.  - We can try and be more aggressive with the shot dosing/mixing.   2. Gastroesophageal reflux disease - Continue with your PPI daily.   3. Cough - We did not test your lungs today.  - We will look at that at your next appointment. - The nasal sprays seems to be helping as well.  4. Return in about 3 months (around 07/08/2019). This can be an in-person, a virtual Webex or a telephone follow up visit.   Subjective:   [name] Harper is a 59 y.o. male presenting today for follow up of  Chief Complaint  Patient presents with  . Allergies  . Allergy Testing    [name] Harper has a history of the following: Patient Active Problem List   Diagnosis Date Noted  . Non-intractable vomiting   . Fatty liver 01/25/2018  . Chronic cough 01/25/2018  . Chronic superficial gastritis without bleeding   . Polyp of descending colon   . Encounter for screening colonoscopy 03/02/2017  . Abnormal LFTs 03/02/2017  . Nausea without vomiting 03/02/2017  . Post-nasal drip 11/29/2016  . Perennial allergic rhinitis 11/29/2016  . Gastroesophageal reflux disease 11/29/2016  . 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  [name] Harper is a 59 y.o. male presenting for skin testing.  He was last seen a few weeks ago via a telephone visit.  At that time, he was having nausea and vomiting which he felt was secondary to postnasal drip and mucus production.  We added on nasal ipratropium to see if this would help with his postnasal drip.  We continued with Xyzal daily.  We also discussed possibly restarting allergen immunotherapy, but as his last testing was done in 2015, we needed to get him in for updated testing.  Since the last visit, he has done well. He has remained off of Xyzal for the testing today. She does report that he has had improvement with the new nasal spray and actually did not have any nausea and vomiting since starting it. He is open to restarting allergen immunotherapy.  Otherwise, there have been no changes to his past medical history, surgical history, family history, or social history.    Review of Systems  Constitutional: Negative.  Negative for fever, malaise/fatigue and weight loss.  HENT: Positive for congestion and sinus pain. Negative for ear discharge and ear pain.   Eyes: Negative for pain, discharge and redness.  Respiratory: Negative for cough, sputum production, shortness of breath and wheezing.   Cardiovascular: Negative.  Negative for chest pain and palpitations.  Gastrointestinal: Positive for  nausea. Negative for abdominal pain, constipation, diarrhea, heartburn and vomiting.  Skin: Negative.  Negative for itching and rash.  Neurological: Negative for dizziness and headaches.  Endo/Heme/Allergies: Negative for environmental allergies. Does not bruise/bleed easily.       Objective:   Blood pressure 132/78, pulse 100, temperature 98.2 F (36.8 C), temperature source Temporal, resp. rate 20, SpO2 96 %. There is no height or weight on file to calculate BMI.   Physical Exam:  Physical Exam  Constitutional: He appears well-developed.  HENT:  Head: Normocephalic and atraumatic.  Right Ear: Tympanic membrane, external ear and ear canal normal.  Left Ear: Tympanic membrane, external ear and ear canal normal.  Nose: Mucosal edema and rhinorrhea present. No nasal deformity or septal deviation. No epistaxis. Right sinus exhibits no maxillary sinus tenderness and no frontal sinus tenderness. Left sinus exhibits no maxillary sinus tenderness and no frontal sinus tenderness.  Mouth/Throat: Uvula is midline and oropharynx is clear and moist. Mucous membranes are not pale and not dry.  Turbinates are enlarged bilaterally with clear discharge. Tonsils normal bilaterally without discharge.   Eyes: Pupils are equal, round, and reactive to light. Conjunctivae and EOM are normal. Right eye exhibits no chemosis and no discharge. Left eye exhibits no chemosis and no discharge. Right conjunctiva is not injected. Left conjunctiva is not injected.  Cardiovascular: Normal rate, regular rhythm and normal heart sounds.  Respiratory: Effort normal and breath sounds normal. No accessory muscle usage. No tachypnea. No respiratory distress. He has no wheezes. He has no rhonchi. He has no rales. He exhibits no tenderness.  Moving air well in all lung fields. No increased work of breathing.  Lymphadenopathy:    He has no cervical adenopathy.  Neurological: He is alert.  Skin: No abrasion, no petechiae and  no rash noted. Rash is not papular, not vesicular and not urticarial. No erythema. No pallor.  Psychiatric: He has a normal mood and affect.     Diagnostic studies:     Spirometry: Normal FEV1, FVC, and FEV1/FVC ratio. There is no scooping suggestive of obstructive disease.   Allergy Studies:    Airborne Adult Perc - 04/10/19 0942    Time Antigen Placed  [id number]    Allergen Manufacturer  Lavella Hammock    Location  Back    Number of Test  59    1. Control-Buffer 50% Glycerol  Negative    2. Control-Histamine 1 mg/ml  Negative    3. Albumin saline  Negative    4. [place]  Negative    5. [place]  Negative    6. Johnson  Negative    7. [place] Blue  2+    8. Meadow Fescue  2+    9. Perennial Rye  2+    10. Sweet Vernal  2+    11. Timothy  2+    12. Cocklebur  2+    13. Burweed Marshelder  Negative    14. Ragweed, short  2+    15.  Ragweed, Giant  2+    16. Plantain,  English  2+    17. Lamb's Quarters  Negative    18. Sheep Sorrell  Negative    19. Rough Pigweed  Negative    20. Marsh Elder, Rough  Negative    21. Mugwort, Common  Negative    22. Ash mix  Negative    23. Birch mix  3+    24. Beech American  2+    25. Box, Elder  Negative    26. Cedar, red  Negative    27. Cottonwood, [place]  Negative    28. Elm mix  Negative    29. Hickory mix  3+    30. Maple mix  Negative    31. Oak, [place] mix  2+    32. Pecan Pollen  3+    33. Pine mix  Negative    34. Sycamore Eastern  Negative    35. Houstonia, Black Pollen  3+    36. Alternaria alternata  Negative    37. Cladosporium Herbarum  Negative    38. Aspergillus mix  Negative    39. Penicillium mix  Negative    40. Bipolaris sorokiniana (Helminthosporium)  Negative    41. Drechslera spicifera (Curvularia)  Negative    42. Mucor plumbeus  Negative    43. Fusarium moniliforme  Negative    44. Aureobasidium pullulans (pullulara)  Negative    45. Rhizopus oryzae  Negative    46. Botrytis cinera  Negative    47. Epicoccum nigrum   Negative    48. Phoma betae  Negative    49. Candida Albicans  Negative    50. Trichophyton mentagrophytes  Negative    51. Mite, D Farinae  5,000 AU/ml  Negative    52. Mite, D Pteronyssinus  5,000 AU/ml  Negative    53. Cat Hair 10,000 BAU/ml  3+    54.  Dog Epithelia  Negative    55. Mixed Feathers  Negative    56. Horse Epithelia  2+    57. Cockroach, German  Negative    58. Mouse  Negative    59. Tobacco Leaf  Negative     Intradermal - 04/10/19 1022    Time Antigen Placed  1023    Allergen Manufacturer  Lavella Hammock    Location  Arm    Number of Test  10    Control  Negative    [place]  Negative    Johnson  Negative    Mold 1  Negative    Mold 2  3+    Mold 3  Negative    Mold 4  2+    Dog  2+    Cockroach  3+    Mite mix  3+       Allergy testing results were read and interpreted by myself, documented by clinical staff.      [name], MD  [facility]

## 2019-04-10 NOTE — Patient Instructions (Addendum)
1. Seasonal and perennial allergic rhinitis - Testing today showed: horse, grasses, ragweed, weeds, trees, indoor molds, dust mites, cat, dog and cockroach - Copy of test results provided.  - Avoidance measures provided. - Continue with: nasal ipratropium and Xyzal 5mg   - We will re mix your allergy vials and you can start in three weeks.  - We can try and be more aggressive with the shot dosing/mixing.   2. Gastroesophageal reflux disease - Continue with your PPI daily.   3. Cough - We did not test your lungs today.  - We will look at that at your next appointment. - The nasal sprays seems to be helping as well.  4. Return in about 3 months (around 07/08/2019). This can be an in-person, a virtual Webex or a telephone follow up visit.   Please inform us of any Emergency Department visits, hospitalizations, or changes in symptoms. Call us before going to the ED for breathing or allergy symptoms since we might be able to fit you in for a sick visit. Feel free to contact us anytime with any questions, problems, or concerns.  It was a pleasure to see you again today!  Websites that have reliable patient information: 1. American Academy of Asthma, Allergy, and Immunology: www.aaaai.org 2. Food Allergy Research and Education (FARE): foodallergy.org 3. Mothers of Asthmatics: http://www.asthmacommunitynetwork.org 4. American College of Allergy, Asthma, and Immunology: www.acaai.org   COVID-19 Vaccine Information can be found at: ShippingScam.co.uk For questions related to vaccine distribution or appointments, please email [email or web address] or call [phone].     "Like" [place] on Facebook and Instagram for our latest updates!        Make sure you are registered to vote! If you have moved or changed any of your contact information, you will need to get this updated before voting!  In some cases, you MAY be able to register  to vote online: [email or web address]

## 2019-04-10 NOTE — Addendum Note (Signed)
Addended by: Cathi Roan on: 04/10/2019 04:27 PM   Modules accepted: Orders

## 2019-04-19 DIAGNOSIS — E7849 Other hyperlipidemia: Secondary | ICD-10-CM | POA: Diagnosis not present

## 2019-04-19 DIAGNOSIS — I10 Essential (primary) hypertension: Secondary | ICD-10-CM | POA: Diagnosis not present

## 2019-04-25 ENCOUNTER — Other Ambulatory Visit: Payer: Self-pay | Admitting: Allergy & Immunology

## 2019-05-01 DIAGNOSIS — J3081 Allergic rhinitis due to animal (cat) (dog) hair and dander: Secondary | ICD-10-CM | POA: Diagnosis not present

## 2019-05-01 NOTE — Addendum Note (Signed)
Addended by: Valentina Shaggy on: 05/01/2019 02:12 PM   Modules accepted: Orders

## 2019-05-01 NOTE — Progress Notes (Signed)
VIALS EXP 04-30-20

## 2019-05-02 DIAGNOSIS — J3089 Other allergic rhinitis: Secondary | ICD-10-CM | POA: Diagnosis not present

## 2019-05-22 DIAGNOSIS — I10 Essential (primary) hypertension: Secondary | ICD-10-CM | POA: Diagnosis not present

## 2019-05-22 DIAGNOSIS — E7849 Other hyperlipidemia: Secondary | ICD-10-CM | POA: Diagnosis not present

## 2019-05-29 ENCOUNTER — Ambulatory Visit: Payer: Self-pay

## 2019-05-29 ENCOUNTER — Ambulatory Visit (INDEPENDENT_AMBULATORY_CARE_PROVIDER_SITE_OTHER): Payer: PPO

## 2019-05-29 DIAGNOSIS — J309 Allergic rhinitis, unspecified: Secondary | ICD-10-CM | POA: Diagnosis not present

## 2019-05-29 MED ORDER — EPINEPHRINE 0.3 MG/0.3ML IJ SOAJ: INTRAMUSCULAR | 1 refills | Status: DC

## 2019-05-29 NOTE — Progress Notes (Signed)
Immunotherapy   Patient Details  Name: [name] Harper MRN: [id number] Date of Birth: [birth year]  05/29/2019 Patient started allergy injections today. Patient received 0.05 from Blue vial [id number] and 0.05 from Blue vial [id number]. Patient will follow Schedule B and may receive injections 1-2 times weekly. Consent signed and patient instructions given. EpiPen refill sent to pharmacy. Patient aware. Patient waited 30 minutes prior to leaving. No adverse reactions noted.    Cathi Roan 05/29/2019, 9:32 AM

## 2019-05-31 DIAGNOSIS — R945 Abnormal results of liver function studies: Secondary | ICD-10-CM | POA: Diagnosis not present

## 2019-05-31 DIAGNOSIS — K2100 Gastro-esophageal reflux disease with esophagitis, without bleeding: Secondary | ICD-10-CM | POA: Diagnosis not present

## 2019-05-31 DIAGNOSIS — E782 Mixed hyperlipidemia: Secondary | ICD-10-CM | POA: Diagnosis not present

## 2019-05-31 DIAGNOSIS — E7800 Pure hypercholesterolemia, unspecified: Secondary | ICD-10-CM | POA: Diagnosis not present

## 2019-05-31 DIAGNOSIS — E119 Type 2 diabetes mellitus without complications: Secondary | ICD-10-CM | POA: Diagnosis not present

## 2019-05-31 DIAGNOSIS — I10 Essential (primary) hypertension: Secondary | ICD-10-CM | POA: Diagnosis not present

## 2019-06-04 DIAGNOSIS — R945 Abnormal results of liver function studies: Secondary | ICD-10-CM | POA: Diagnosis not present

## 2019-06-04 DIAGNOSIS — E291 Testicular hypofunction: Secondary | ICD-10-CM | POA: Diagnosis not present

## 2019-06-04 DIAGNOSIS — F411 Generalized anxiety disorder: Secondary | ICD-10-CM | POA: Diagnosis not present

## 2019-06-04 DIAGNOSIS — F5221 Male erectile disorder: Secondary | ICD-10-CM | POA: Diagnosis not present

## 2019-06-04 DIAGNOSIS — I10 Essential (primary) hypertension: Secondary | ICD-10-CM | POA: Diagnosis not present

## 2019-06-04 DIAGNOSIS — Z6836 Body mass index (BMI) 36.0-36.9, adult: Secondary | ICD-10-CM | POA: Diagnosis not present

## 2019-06-04 DIAGNOSIS — E119 Type 2 diabetes mellitus without complications: Secondary | ICD-10-CM | POA: Diagnosis not present

## 2019-06-04 DIAGNOSIS — E782 Mixed hyperlipidemia: Secondary | ICD-10-CM | POA: Diagnosis not present

## 2019-06-05 ENCOUNTER — Ambulatory Visit (INDEPENDENT_AMBULATORY_CARE_PROVIDER_SITE_OTHER): Payer: PPO

## 2019-06-05 DIAGNOSIS — J309 Allergic rhinitis, unspecified: Secondary | ICD-10-CM | POA: Diagnosis not present

## 2019-06-12 ENCOUNTER — Ambulatory Visit (INDEPENDENT_AMBULATORY_CARE_PROVIDER_SITE_OTHER): Payer: PPO

## 2019-06-12 DIAGNOSIS — J309 Allergic rhinitis, unspecified: Secondary | ICD-10-CM | POA: Diagnosis not present

## 2019-06-17 ENCOUNTER — Other Ambulatory Visit: Payer: Self-pay | Admitting: Allergy & Immunology

## 2019-06-17 DIAGNOSIS — Z23 Encounter for immunization: Secondary | ICD-10-CM | POA: Diagnosis not present

## 2019-06-19 ENCOUNTER — Ambulatory Visit (INDEPENDENT_AMBULATORY_CARE_PROVIDER_SITE_OTHER): Payer: PPO

## 2019-06-19 DIAGNOSIS — J309 Allergic rhinitis, unspecified: Secondary | ICD-10-CM | POA: Diagnosis not present

## 2019-06-21 DIAGNOSIS — F411 Generalized anxiety disorder: Secondary | ICD-10-CM | POA: Diagnosis not present

## 2019-06-21 DIAGNOSIS — I10 Essential (primary) hypertension: Secondary | ICD-10-CM | POA: Diagnosis not present

## 2019-06-21 DIAGNOSIS — E119 Type 2 diabetes mellitus without complications: Secondary | ICD-10-CM | POA: Diagnosis not present

## 2019-06-26 ENCOUNTER — Ambulatory Visit (INDEPENDENT_AMBULATORY_CARE_PROVIDER_SITE_OTHER): Payer: PPO

## 2019-06-26 DIAGNOSIS — J309 Allergic rhinitis, unspecified: Secondary | ICD-10-CM | POA: Diagnosis not present

## 2019-07-03 ENCOUNTER — Ambulatory Visit (INDEPENDENT_AMBULATORY_CARE_PROVIDER_SITE_OTHER): Payer: PPO

## 2019-07-03 DIAGNOSIS — J309 Allergic rhinitis, unspecified: Secondary | ICD-10-CM

## 2019-07-12 ENCOUNTER — Encounter: Payer: Self-pay | Admitting: Allergy & Immunology

## 2019-07-12 ENCOUNTER — Ambulatory Visit (INDEPENDENT_AMBULATORY_CARE_PROVIDER_SITE_OTHER): Payer: PPO | Admitting: Allergy & Immunology

## 2019-07-12 ENCOUNTER — Other Ambulatory Visit: Payer: Self-pay

## 2019-07-12 ENCOUNTER — Ambulatory Visit: Payer: Self-pay

## 2019-07-12 VITALS — BP 114/78 | HR 88 | Resp 14 | Ht 62.21 in | Wt 196.0 lb

## 2019-07-12 DIAGNOSIS — J3089 Other allergic rhinitis: Secondary | ICD-10-CM | POA: Insufficient documentation

## 2019-07-12 DIAGNOSIS — R05 Cough: Secondary | ICD-10-CM | POA: Diagnosis not present

## 2019-07-12 DIAGNOSIS — J302 Other seasonal allergic rhinitis: Secondary | ICD-10-CM | POA: Insufficient documentation

## 2019-07-12 DIAGNOSIS — K219 Gastro-esophageal reflux disease without esophagitis: Secondary | ICD-10-CM

## 2019-07-12 DIAGNOSIS — R059 Cough, unspecified: Secondary | ICD-10-CM | POA: Insufficient documentation

## 2019-07-12 DIAGNOSIS — J309 Allergic rhinitis, unspecified: Secondary | ICD-10-CM

## 2019-07-12 NOTE — Progress Notes (Signed)
FOLLOW UP  Date of Service/Encounter:  07/12/19   Assessment:   Seasonal and perennial allergic rhinitis - on allergen immunotherapy  Gastroesophageal reflux disease  Cough - with normal spiro  Plan/Recommendations:   Allergic rhinitis Continue allergen avoidance measures Continue Xyzal 5 mg-taking 1 tablet once a day as needed for runny nose and itching Continue ipratropium bromide nasal spray as needed Continue azelastine nasal spray using 1 to 2 sprays each nostril twice a day Continue buildup of environmental allergy injections.  Reflux Continue dietary and lifestyle modifications.  Continue all other scheduled medications. Please let us know if this treatment plan is not working well for you. Schedule follow-up appointment in 6 months    Subjective:   [name] Harper is a 59 y.o. male presenting today for follow up of No chief complaint on file.   [name] Harper has a history of the following: Patient Active Problem List   Diagnosis Date Noted  . Seasonal and perennial allergic rhinitis 07/12/2019  . Cough 07/12/2019  . Non-intractable vomiting   . Fatty liver 01/25/2018  . Chronic cough 01/25/2018  . Chronic superficial gastritis without bleeding   . Polyp of descending colon   . Encounter for screening colonoscopy 03/02/2017  . Abnormal LFTs 03/02/2017  . Nausea without vomiting 03/02/2017  . Post-nasal drip 11/29/2016  . Perennial allergic rhinitis 11/29/2016  . Gastroesophageal reflux disease 11/29/2016  . 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  [name] Harper is a 59 y.o. male presenting for a follow up visit.  Allergic Rhinitis Symptom History: He reports a small amount of clear rhinorrhea and denies nasal congestion, sneezing and post nasal drip. He is currently in buildup process of  allergy injections for ragweed, mold, cockroach, dust mite, grass, tree, cat and dog. He denies any large local  reactions and feels that between the allergy injections and Xyzal, azelastine nasal spray and ipratriopium bromide nasal spray that his symptoms are much better.  His cough is reported as a lot better. He denies any wheezing, tightness in chest or shortness of breath.  Reflux is reported as well controlled. He is currently not taking any medication and denies any heartburn, reflux or burping.  Otherwise, there have been no changes to his past medical history, surgical history, family history, or social history.    Review of Systems  Constitutional: Negative for chills and fever.  HENT: Negative for congestion.        Denies nasal congestion, sneezing and post nasal drainage. Reports small amount of clear rhinorrhea.  Respiratory: Negative for shortness of breath and wheezing.        Reports that cough is better  Skin: Negative for rash.  Endo/Heme/Allergies: Positive for environmental allergies.       Objective:   Blood pressure 114/78, pulse 88, resp. rate 14, height 5' 2.21" (1.58 m), weight 196 lb (88.9 kg), SpO2 96 %. Body mass index is 35.61 kg/m.   Physical Exam:  Physical Exam  Vitals reviewed. Constitutional: He is oriented to person, place, and time. He appears well-developed and well-nourished.  HENT:  Head: Normocephalic and atraumatic.  Right Ear: External ear normal.  Left Ear: External ear normal.  Nose: Nose normal.  Mouth/Throat: Oropharynx is clear and moist.  Eyes: Conjunctivae are normal.  Cardiovascular: Normal rate, regular rhythm and normal heart sounds.  Respiratory: Effort normal and breath sounds normal.  Lungs clear to auscultation  Musculoskeletal:  Cervical back: Neck supple.  Neurological: He is alert and oriented to person, place, and time.  Skin: Skin is warm and dry.  Psychiatric: He has a normal mood and affect. His behavior is normal. Judgment and thought content normal.     Diagnostic studies:   Spirometry: 3.96 L, FEV1 2.99  L.  Predicted FVC 3.50 L, FEV1 2.66 L.  Spirometry indicates normal ventilatory function.Marland Kitchen    Spirometry consistent with normal pattern.    Thank you for the opportunity to care for this patient.  Please do not hesitate to contact me with any questions.  Althea Charon, FNP [facility]    [name], MD  [facility]

## 2019-07-12 NOTE — Patient Instructions (Addendum)
Allergic rhinitis Continue allergen avoidance measures Continue Xyzal 5 mg-taking 1 tablet once a day as needed for runny nose and itching Continue ipratropium bromide nasal spray as needed Continue azelastine nasal spray using 1 to 2 sprays each nostril twice a day Continue buildup of environmental allergy injections.  Reflux Continue dietary and lifestyle modifications. Lifestyle Changes for Controlling GERD When you have GERD, stomach acid feels as if it's backing up toward your mouth. Whether or not you take medication to control your GERD, your symptoms can often be improved with lifestyle changes.   Raise Your Head  Reflux is more likely to strike when you're lying down flat, because stomach fluid can  flow backward more easily. Raising the head of your bed 4-6 inches can help. To do this:  Slide blocks or books under the legs at the head of your bed. Or, place a wedge under  the mattress. Many foam stores can make a suitable wedge for you. The wedge  should run from your waist to the top of your head.  Don't just prop your head on several pillows. This increases pressure on your  stomach. It can make GERD worse.  Watch Your Eating Habits Certain foods may increase the acid in your stomach or relax the lower esophageal sphincter, making GERD more likely. It's best to avoid the following:  Coffee, tea, and carbonated drinks (with and without caffeine)  Fatty, fried, or spicy food  Mint, chocolate, onions, and tomatoes  Any other foods that seem to irritate your stomach or cause you pain  Relieve the Pressure  Eat smaller meals, even if you have to eat more often.  Don't lie down right after you eat. Wait a few hours for your stomach to empty.  Avoid tight belts and tight-fitting clothes.  Lose excess weight.  Tobacco and Alcohol Avoid smoking tobacco and drinking alcohol. They can make GERD symptoms worse.  Continue all other scheduled medications. Please  let us know if this treatment plan is not working well for you. Schedule follow-up appointment in 6 months

## 2019-07-15 DIAGNOSIS — Z23 Encounter for immunization: Secondary | ICD-10-CM | POA: Diagnosis not present

## 2019-07-17 ENCOUNTER — Ambulatory Visit (INDEPENDENT_AMBULATORY_CARE_PROVIDER_SITE_OTHER): Payer: PPO

## 2019-07-17 DIAGNOSIS — J309 Allergic rhinitis, unspecified: Secondary | ICD-10-CM | POA: Diagnosis not present

## 2019-07-22 DIAGNOSIS — E119 Type 2 diabetes mellitus without complications: Secondary | ICD-10-CM | POA: Diagnosis not present

## 2019-07-22 DIAGNOSIS — I10 Essential (primary) hypertension: Secondary | ICD-10-CM | POA: Diagnosis not present

## 2019-07-22 DIAGNOSIS — E7849 Other hyperlipidemia: Secondary | ICD-10-CM | POA: Diagnosis not present

## 2019-07-24 ENCOUNTER — Ambulatory Visit (INDEPENDENT_AMBULATORY_CARE_PROVIDER_SITE_OTHER): Payer: PPO

## 2019-07-24 DIAGNOSIS — J309 Allergic rhinitis, unspecified: Secondary | ICD-10-CM | POA: Diagnosis not present

## 2019-07-31 ENCOUNTER — Ambulatory Visit (INDEPENDENT_AMBULATORY_CARE_PROVIDER_SITE_OTHER): Payer: PPO

## 2019-07-31 DIAGNOSIS — J309 Allergic rhinitis, unspecified: Secondary | ICD-10-CM | POA: Diagnosis not present

## 2019-08-07 ENCOUNTER — Ambulatory Visit (INDEPENDENT_AMBULATORY_CARE_PROVIDER_SITE_OTHER): Payer: PPO

## 2019-08-07 DIAGNOSIS — J309 Allergic rhinitis, unspecified: Secondary | ICD-10-CM

## 2019-08-14 ENCOUNTER — Ambulatory Visit (INDEPENDENT_AMBULATORY_CARE_PROVIDER_SITE_OTHER): Payer: PPO

## 2019-08-14 DIAGNOSIS — J309 Allergic rhinitis, unspecified: Secondary | ICD-10-CM

## 2019-08-21 ENCOUNTER — Ambulatory Visit (INDEPENDENT_AMBULATORY_CARE_PROVIDER_SITE_OTHER): Payer: PPO

## 2019-08-21 DIAGNOSIS — E119 Type 2 diabetes mellitus without complications: Secondary | ICD-10-CM | POA: Diagnosis not present

## 2019-08-21 DIAGNOSIS — J309 Allergic rhinitis, unspecified: Secondary | ICD-10-CM | POA: Diagnosis not present

## 2019-08-21 DIAGNOSIS — I10 Essential (primary) hypertension: Secondary | ICD-10-CM | POA: Diagnosis not present

## 2019-08-21 DIAGNOSIS — E7849 Other hyperlipidemia: Secondary | ICD-10-CM | POA: Diagnosis not present

## 2019-08-28 ENCOUNTER — Ambulatory Visit (INDEPENDENT_AMBULATORY_CARE_PROVIDER_SITE_OTHER): Payer: PPO

## 2019-08-28 DIAGNOSIS — J309 Allergic rhinitis, unspecified: Secondary | ICD-10-CM

## 2019-09-04 ENCOUNTER — Ambulatory Visit (INDEPENDENT_AMBULATORY_CARE_PROVIDER_SITE_OTHER): Payer: PPO

## 2019-09-04 DIAGNOSIS — J309 Allergic rhinitis, unspecified: Secondary | ICD-10-CM

## 2019-09-11 ENCOUNTER — Ambulatory Visit (INDEPENDENT_AMBULATORY_CARE_PROVIDER_SITE_OTHER): Payer: PPO

## 2019-09-11 ENCOUNTER — Other Ambulatory Visit: Payer: Self-pay | Admitting: Allergy & Immunology

## 2019-09-11 DIAGNOSIS — J309 Allergic rhinitis, unspecified: Secondary | ICD-10-CM | POA: Diagnosis not present

## 2019-09-17 DIAGNOSIS — S39012A Strain of muscle, fascia and tendon of lower back, initial encounter: Secondary | ICD-10-CM | POA: Diagnosis not present

## 2019-09-18 ENCOUNTER — Ambulatory Visit (INDEPENDENT_AMBULATORY_CARE_PROVIDER_SITE_OTHER): Payer: PPO

## 2019-09-18 DIAGNOSIS — J309 Allergic rhinitis, unspecified: Secondary | ICD-10-CM | POA: Diagnosis not present

## 2019-09-20 DIAGNOSIS — E7849 Other hyperlipidemia: Secondary | ICD-10-CM | POA: Diagnosis not present

## 2019-09-20 DIAGNOSIS — I10 Essential (primary) hypertension: Secondary | ICD-10-CM | POA: Diagnosis not present

## 2019-09-20 DIAGNOSIS — E119 Type 2 diabetes mellitus without complications: Secondary | ICD-10-CM | POA: Diagnosis not present

## 2019-09-27 ENCOUNTER — Ambulatory Visit (INDEPENDENT_AMBULATORY_CARE_PROVIDER_SITE_OTHER): Payer: PPO

## 2019-09-27 DIAGNOSIS — J309 Allergic rhinitis, unspecified: Secondary | ICD-10-CM

## 2019-10-01 DIAGNOSIS — E119 Type 2 diabetes mellitus without complications: Secondary | ICD-10-CM | POA: Diagnosis not present

## 2019-10-01 DIAGNOSIS — K2100 Gastro-esophageal reflux disease with esophagitis, without bleeding: Secondary | ICD-10-CM | POA: Diagnosis not present

## 2019-10-01 DIAGNOSIS — I10 Essential (primary) hypertension: Secondary | ICD-10-CM | POA: Diagnosis not present

## 2019-10-01 DIAGNOSIS — E7800 Pure hypercholesterolemia, unspecified: Secondary | ICD-10-CM | POA: Diagnosis not present

## 2019-10-01 DIAGNOSIS — E782 Mixed hyperlipidemia: Secondary | ICD-10-CM | POA: Diagnosis not present

## 2019-10-01 DIAGNOSIS — R945 Abnormal results of liver function studies: Secondary | ICD-10-CM | POA: Diagnosis not present

## 2019-10-02 ENCOUNTER — Ambulatory Visit (INDEPENDENT_AMBULATORY_CARE_PROVIDER_SITE_OTHER): Payer: PPO

## 2019-10-02 DIAGNOSIS — J309 Allergic rhinitis, unspecified: Secondary | ICD-10-CM

## 2019-10-03 DIAGNOSIS — E782 Mixed hyperlipidemia: Secondary | ICD-10-CM | POA: Diagnosis not present

## 2019-10-03 DIAGNOSIS — R945 Abnormal results of liver function studies: Secondary | ICD-10-CM | POA: Diagnosis not present

## 2019-10-03 DIAGNOSIS — E119 Type 2 diabetes mellitus without complications: Secondary | ICD-10-CM | POA: Diagnosis not present

## 2019-10-03 DIAGNOSIS — Z6837 Body mass index (BMI) 37.0-37.9, adult: Secondary | ICD-10-CM | POA: Diagnosis not present

## 2019-10-03 DIAGNOSIS — I10 Essential (primary) hypertension: Secondary | ICD-10-CM | POA: Diagnosis not present

## 2019-10-03 DIAGNOSIS — E291 Testicular hypofunction: Secondary | ICD-10-CM | POA: Diagnosis not present

## 2019-10-03 DIAGNOSIS — F411 Generalized anxiety disorder: Secondary | ICD-10-CM | POA: Diagnosis not present

## 2019-10-03 DIAGNOSIS — F3341 Major depressive disorder, recurrent, in partial remission: Secondary | ICD-10-CM | POA: Diagnosis not present

## 2019-10-09 ENCOUNTER — Ambulatory Visit (INDEPENDENT_AMBULATORY_CARE_PROVIDER_SITE_OTHER): Payer: PPO

## 2019-10-09 DIAGNOSIS — J309 Allergic rhinitis, unspecified: Secondary | ICD-10-CM | POA: Diagnosis not present

## 2019-10-16 ENCOUNTER — Ambulatory Visit (INDEPENDENT_AMBULATORY_CARE_PROVIDER_SITE_OTHER): Payer: PPO

## 2019-10-16 DIAGNOSIS — J309 Allergic rhinitis, unspecified: Secondary | ICD-10-CM | POA: Diagnosis not present

## 2019-10-25 ENCOUNTER — Ambulatory Visit (INDEPENDENT_AMBULATORY_CARE_PROVIDER_SITE_OTHER): Payer: PPO

## 2019-10-25 DIAGNOSIS — J309 Allergic rhinitis, unspecified: Secondary | ICD-10-CM

## 2019-10-30 ENCOUNTER — Ambulatory Visit (INDEPENDENT_AMBULATORY_CARE_PROVIDER_SITE_OTHER): Payer: PPO

## 2019-10-30 DIAGNOSIS — J309 Allergic rhinitis, unspecified: Secondary | ICD-10-CM

## 2019-11-06 ENCOUNTER — Ambulatory Visit (INDEPENDENT_AMBULATORY_CARE_PROVIDER_SITE_OTHER): Payer: PPO

## 2019-11-06 DIAGNOSIS — J309 Allergic rhinitis, unspecified: Secondary | ICD-10-CM | POA: Diagnosis not present

## 2019-11-13 ENCOUNTER — Ambulatory Visit (INDEPENDENT_AMBULATORY_CARE_PROVIDER_SITE_OTHER): Payer: PPO

## 2019-11-13 DIAGNOSIS — J309 Allergic rhinitis, unspecified: Secondary | ICD-10-CM | POA: Diagnosis not present

## 2019-11-20 ENCOUNTER — Ambulatory Visit (INDEPENDENT_AMBULATORY_CARE_PROVIDER_SITE_OTHER): Payer: PPO

## 2019-11-20 DIAGNOSIS — J309 Allergic rhinitis, unspecified: Secondary | ICD-10-CM | POA: Diagnosis not present

## 2019-11-21 DIAGNOSIS — E7849 Other hyperlipidemia: Secondary | ICD-10-CM | POA: Diagnosis not present

## 2019-11-21 DIAGNOSIS — I10 Essential (primary) hypertension: Secondary | ICD-10-CM | POA: Diagnosis not present

## 2019-11-21 DIAGNOSIS — E119 Type 2 diabetes mellitus without complications: Secondary | ICD-10-CM | POA: Diagnosis not present

## 2019-11-27 ENCOUNTER — Ambulatory Visit (INDEPENDENT_AMBULATORY_CARE_PROVIDER_SITE_OTHER): Payer: PPO

## 2019-11-27 DIAGNOSIS — J309 Allergic rhinitis, unspecified: Secondary | ICD-10-CM | POA: Diagnosis not present

## 2019-11-28 DIAGNOSIS — J3081 Allergic rhinitis due to animal (cat) (dog) hair and dander: Secondary | ICD-10-CM | POA: Diagnosis not present

## 2019-11-28 NOTE — Progress Notes (Signed)
VIALS EXP 11-27-20

## 2019-11-29 DIAGNOSIS — J3089 Other allergic rhinitis: Secondary | ICD-10-CM | POA: Diagnosis not present

## 2019-12-04 ENCOUNTER — Ambulatory Visit (INDEPENDENT_AMBULATORY_CARE_PROVIDER_SITE_OTHER): Payer: PPO

## 2019-12-04 DIAGNOSIS — J309 Allergic rhinitis, unspecified: Secondary | ICD-10-CM

## 2019-12-11 ENCOUNTER — Ambulatory Visit (INDEPENDENT_AMBULATORY_CARE_PROVIDER_SITE_OTHER): Payer: PPO

## 2019-12-11 DIAGNOSIS — J309 Allergic rhinitis, unspecified: Secondary | ICD-10-CM | POA: Diagnosis not present

## 2019-12-18 ENCOUNTER — Ambulatory Visit (INDEPENDENT_AMBULATORY_CARE_PROVIDER_SITE_OTHER): Payer: PPO

## 2019-12-18 DIAGNOSIS — J309 Allergic rhinitis, unspecified: Secondary | ICD-10-CM | POA: Diagnosis not present

## 2019-12-20 ENCOUNTER — Other Ambulatory Visit: Payer: Self-pay | Admitting: Allergy & Immunology

## 2019-12-25 ENCOUNTER — Ambulatory Visit (INDEPENDENT_AMBULATORY_CARE_PROVIDER_SITE_OTHER): Payer: PPO

## 2019-12-25 DIAGNOSIS — J309 Allergic rhinitis, unspecified: Secondary | ICD-10-CM | POA: Diagnosis not present

## 2020-01-01 ENCOUNTER — Ambulatory Visit (INDEPENDENT_AMBULATORY_CARE_PROVIDER_SITE_OTHER): Payer: PPO

## 2020-01-01 DIAGNOSIS — J309 Allergic rhinitis, unspecified: Secondary | ICD-10-CM | POA: Diagnosis not present

## 2020-01-08 ENCOUNTER — Other Ambulatory Visit: Payer: Self-pay

## 2020-01-08 ENCOUNTER — Encounter: Payer: Self-pay | Admitting: Allergy & Immunology

## 2020-01-08 ENCOUNTER — Ambulatory Visit: Payer: PPO | Admitting: Allergy & Immunology

## 2020-01-08 VITALS — BP 128/86 | HR 86 | Resp 18 | Ht 64.0 in | Wt 195.0 lb

## 2020-01-08 DIAGNOSIS — J302 Other seasonal allergic rhinitis: Secondary | ICD-10-CM | POA: Diagnosis not present

## 2020-01-08 DIAGNOSIS — K219 Gastro-esophageal reflux disease without esophagitis: Secondary | ICD-10-CM

## 2020-01-08 DIAGNOSIS — J309 Allergic rhinitis, unspecified: Secondary | ICD-10-CM | POA: Diagnosis not present

## 2020-01-08 DIAGNOSIS — J3089 Other allergic rhinitis: Secondary | ICD-10-CM | POA: Diagnosis not present

## 2020-01-08 DIAGNOSIS — R059 Cough, unspecified: Secondary | ICD-10-CM | POA: Diagnosis not present

## 2020-01-08 MED ORDER — MOMETASONE FUROATE 50 MCG/ACT NA SUSP
2.0000 | Freq: Every day | NASAL | 5 refills | Status: DC
Start: 2020-01-08 — End: 2020-07-29

## 2020-01-08 NOTE — Patient Instructions (Addendum)
1. Seasonal and perennial allergic rhinitis (horse, grasses, ragweed, weeds, trees, indoor molds, dust mites, cat, dog and cockroach) - Continue with allergy shots at the same schedule.  - Continue with: Xyzal 5mg  daily as well as the nasal sprays as you are doing. - Continue to wean down on the nasal sprays as you are doing.   2. Gastroesophageal reflux disease - Continue with your dietary changes.   3. Return in about 1 year (around 01/07/2021).   Please inform us of any Emergency Department visits, hospitalizations, or changes in symptoms. Call us before going to the ED for breathing or allergy symptoms since we might be able to fit you in for a sick visit. Feel free to contact us anytime with any questions, problems, or concerns.  It was a pleasure to see you again today!  Websites that have reliable patient information: 1. American Academy of Asthma, Allergy, and Immunology: www.aaaai.org 2. Food Allergy Research and Education (FARE): foodallergy.org 3. Mothers of Asthmatics: http://www.asthmacommunitynetwork.org 4. American College of Allergy, Asthma, and Immunology: www.acaai.org   COVID-19 Vaccine Information can be found at: ShippingScam.co.uk For questions related to vaccine distribution or appointments, please email [email or web address] or call [phone].     "Like" [place] on Facebook and Instagram for our latest updates!     HAPPY FALL!     Make sure you are registered to vote! If you have moved or changed any of your contact information, you will need to get this updated before voting!  In some cases, you MAY be able to register to vote online: [email or web address]

## 2020-01-08 NOTE — Progress Notes (Signed)
FOLLOW UP  Date of Service/Encounter:  01/08/20   Assessment:   Seasonal and perennial allergic rhinitis - on allergen immunotherapy  Gastroesophageal reflux disease  Cough - with normal spiro  Plan/Recommendations:   1. Seasonal and perennial allergic rhinitis (horse, grasses, ragweed, weeds, trees, indoor molds, dust mites, cat, dog and cockroach) - Continue with allergy shots at the same schedule.  - Continue with: Xyzal 5mg  daily as well as the nasal sprays as you are doing. - Continue to wean down on the nasal sprays as you are doing.   2. Gastroesophageal reflux disease - Continue with your dietary changes.   3. Return in about 1 year (around 01/07/2021).   Subjective:   [name] Harper is a 59 y.o. male presenting today for follow up of  Chief Complaint  Patient presents with  . Allergic Rhinitis     LAMOINE MAGALLON has a history of the following: Patient Active Problem List   Diagnosis Date Noted  . Seasonal and perennial allergic rhinitis 07/12/2019  . Cough 07/12/2019  . Non-intractable vomiting   . Fatty liver 01/25/2018  . Chronic cough 01/25/2018  . Chronic superficial gastritis without bleeding   . Polyp of descending colon   . Encounter for screening colonoscopy 03/02/2017  . Abnormal LFTs 03/02/2017  . Nausea without vomiting 03/02/2017  . Post-nasal drip 11/29/2016  . Perennial allergic rhinitis 11/29/2016  . Gastroesophageal reflux disease 11/29/2016  . 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  Keyion is a 59 y.o. male presenting for a follow up visit.  He was last seen in May 2021.  At that time, he was doing very well on Xyzal, ipratropium, azelastine, nasal sprays.  We continued him on dietary and lifestyle modifications for his reflux.  Since the last visit, he has done well.   Allergic Rhinitis Symptom History: He takes the nasal sprays only daily at this point. He was doing  threse 2-3 times daily. But the shots have helped to decrease his nasal sprays. He has been doing very well with the nasal sprays. He is using the levocetirizine every night. He has not had a sinus infection recently. He has not had any problems with his allergy shots.   Gregorio is on allergen immunotherapy. He receives two injections. Immunotherapy script #1 contains ragweed, molds, dust mites and cockroach. He currently receives 0.1mL of the RED vial (1/100). Immunotherapy script #2 contains trees, grasses, cat and dog. He currently receives 0.21mL of the RED vial (1/100). He started shots April of 2021 and reached maintenance in August of 2021.  GERD Symptom History: He is not taking anything at all.  He took a COVID test 6 months ago and he had two COVID vaccinations. He has not had any symptoms of COVID at all since the last visit.   Otherwise, there have been no changes to his past medical history, surgical history, family history, or social history.    Review of Systems  Constitutional: Negative.  Negative for chills, fever, malaise/fatigue and weight loss.  HENT: Positive for congestion and sinus pain. Negative for ear discharge and ear pain.   Eyes: Negative for pain, discharge and redness.  Respiratory: Negative for cough, sputum production, shortness of breath and wheezing.   Cardiovascular: Negative.  Negative for chest pain and palpitations.  Gastrointestinal: Negative for abdominal pain, constipation, diarrhea, heartburn, nausea and vomiting.  Skin: Negative.  Negative for itching and rash.  Neurological: Negative  for dizziness and headaches.  Endo/Heme/Allergies: Positive for environmental allergies. Does not bruise/bleed easily.       Objective:   Blood pressure 128/86, pulse 86, resp. rate 18, height 5\' 4"  (1.626 m), weight 195 lb (88.5 kg), SpO2 98 %. Body mass index is 33.47 kg/m.   Physical Exam:  Physical Exam Constitutional:      Appearance: He is  well-developed.  HENT:     Head: Normocephalic and atraumatic.     Right Ear: Tympanic membrane, ear canal and external ear normal.     Left Ear: Tympanic membrane, ear canal and external ear normal.     Nose: No nasal deformity, septal deviation, mucosal edema or rhinorrhea.     Right Turbinates: Enlarged and swollen.     Left Turbinates: Enlarged and swollen.     Right Sinus: No maxillary sinus tenderness or frontal sinus tenderness.     Left Sinus: No maxillary sinus tenderness or frontal sinus tenderness.     Mouth/Throat:     Mouth: Mucous membranes are not pale and not dry.     Pharynx: Uvula midline.     Comments: No polyps appreciated. Eyes:     General:        Right eye: No discharge.        Left eye: No discharge.     Conjunctiva/sclera: Conjunctivae normal.     Right eye: Right conjunctiva is not injected. No chemosis.    Left eye: Left conjunctiva is not injected. No chemosis.    Pupils: Pupils are equal, round, and reactive to light.  Cardiovascular:     Rate and Rhythm: Normal rate and regular rhythm.     Heart sounds: Normal heart sounds.  Pulmonary:     Effort: Pulmonary effort is normal. No tachypnea, accessory muscle usage or respiratory distress.     Breath sounds: Normal breath sounds. No wheezing, rhonchi or rales.     Comments: Moving air well in all lung fields. No increased work of breath.  Chest:     Chest wall: No tenderness.  Lymphadenopathy:     Cervical: No cervical adenopathy.  Skin:    General: Skin is warm.     Capillary Refill: Capillary refill takes less than 2 seconds.     Coloration: Skin is not pale.     Findings: No abrasion, erythema, petechiae or rash. Rash is not papular, urticarial or vesicular.     Comments: No eczematous or urticarial lesions noted.   Neurological:     Mental Status: He is alert.  Psychiatric:        Behavior: Behavior is cooperative.      Diagnostic studies: none    [name], MD  [facility] [place]

## 2020-01-15 ENCOUNTER — Ambulatory Visit: Payer: PPO | Admitting: Allergy & Immunology

## 2020-01-15 ENCOUNTER — Ambulatory Visit (INDEPENDENT_AMBULATORY_CARE_PROVIDER_SITE_OTHER): Payer: PPO

## 2020-01-15 DIAGNOSIS — J309 Allergic rhinitis, unspecified: Secondary | ICD-10-CM | POA: Diagnosis not present

## 2020-01-21 DIAGNOSIS — E119 Type 2 diabetes mellitus without complications: Secondary | ICD-10-CM | POA: Diagnosis not present

## 2020-01-21 DIAGNOSIS — I10 Essential (primary) hypertension: Secondary | ICD-10-CM | POA: Diagnosis not present

## 2020-01-21 DIAGNOSIS — E7849 Other hyperlipidemia: Secondary | ICD-10-CM | POA: Diagnosis not present

## 2020-01-22 ENCOUNTER — Ambulatory Visit (INDEPENDENT_AMBULATORY_CARE_PROVIDER_SITE_OTHER): Payer: PPO

## 2020-01-22 DIAGNOSIS — J309 Allergic rhinitis, unspecified: Secondary | ICD-10-CM | POA: Diagnosis not present

## 2020-01-29 ENCOUNTER — Ambulatory Visit (INDEPENDENT_AMBULATORY_CARE_PROVIDER_SITE_OTHER): Payer: PPO

## 2020-01-29 DIAGNOSIS — J309 Allergic rhinitis, unspecified: Secondary | ICD-10-CM

## 2020-02-03 DIAGNOSIS — Z6837 Body mass index (BMI) 37.0-37.9, adult: Secondary | ICD-10-CM | POA: Diagnosis not present

## 2020-02-03 DIAGNOSIS — F3341 Major depressive disorder, recurrent, in partial remission: Secondary | ICD-10-CM | POA: Diagnosis not present

## 2020-02-03 DIAGNOSIS — F411 Generalized anxiety disorder: Secondary | ICD-10-CM | POA: Diagnosis not present

## 2020-02-03 DIAGNOSIS — Z6836 Body mass index (BMI) 36.0-36.9, adult: Secondary | ICD-10-CM | POA: Diagnosis not present

## 2020-02-03 DIAGNOSIS — R945 Abnormal results of liver function studies: Secondary | ICD-10-CM | POA: Diagnosis not present

## 2020-02-03 DIAGNOSIS — E119 Type 2 diabetes mellitus without complications: Secondary | ICD-10-CM | POA: Diagnosis not present

## 2020-02-03 DIAGNOSIS — I10 Essential (primary) hypertension: Secondary | ICD-10-CM | POA: Diagnosis not present

## 2020-02-03 DIAGNOSIS — E291 Testicular hypofunction: Secondary | ICD-10-CM | POA: Diagnosis not present

## 2020-02-03 DIAGNOSIS — E782 Mixed hyperlipidemia: Secondary | ICD-10-CM | POA: Diagnosis not present

## 2020-02-03 DIAGNOSIS — M25561 Pain in right knee: Secondary | ICD-10-CM | POA: Diagnosis not present

## 2020-02-03 DIAGNOSIS — F5221 Male erectile disorder: Secondary | ICD-10-CM | POA: Diagnosis not present

## 2020-02-05 ENCOUNTER — Ambulatory Visit (INDEPENDENT_AMBULATORY_CARE_PROVIDER_SITE_OTHER): Payer: PPO

## 2020-02-05 DIAGNOSIS — J309 Allergic rhinitis, unspecified: Secondary | ICD-10-CM | POA: Diagnosis not present

## 2020-02-12 ENCOUNTER — Ambulatory Visit (INDEPENDENT_AMBULATORY_CARE_PROVIDER_SITE_OTHER): Payer: PPO

## 2020-02-12 DIAGNOSIS — J309 Allergic rhinitis, unspecified: Secondary | ICD-10-CM | POA: Diagnosis not present

## 2020-02-19 ENCOUNTER — Ambulatory Visit (INDEPENDENT_AMBULATORY_CARE_PROVIDER_SITE_OTHER): Payer: PPO

## 2020-02-19 DIAGNOSIS — J309 Allergic rhinitis, unspecified: Secondary | ICD-10-CM | POA: Diagnosis not present

## 2020-02-26 ENCOUNTER — Ambulatory Visit (INDEPENDENT_AMBULATORY_CARE_PROVIDER_SITE_OTHER): Payer: PPO

## 2020-02-26 DIAGNOSIS — J309 Allergic rhinitis, unspecified: Secondary | ICD-10-CM | POA: Diagnosis not present

## 2020-03-06 ENCOUNTER — Ambulatory Visit (INDEPENDENT_AMBULATORY_CARE_PROVIDER_SITE_OTHER): Payer: PPO

## 2020-03-06 DIAGNOSIS — J309 Allergic rhinitis, unspecified: Secondary | ICD-10-CM

## 2020-03-13 ENCOUNTER — Ambulatory Visit (INDEPENDENT_AMBULATORY_CARE_PROVIDER_SITE_OTHER): Payer: PPO

## 2020-03-13 DIAGNOSIS — J309 Allergic rhinitis, unspecified: Secondary | ICD-10-CM | POA: Diagnosis not present

## 2020-03-18 ENCOUNTER — Ambulatory Visit (INDEPENDENT_AMBULATORY_CARE_PROVIDER_SITE_OTHER): Payer: PPO

## 2020-03-18 DIAGNOSIS — J309 Allergic rhinitis, unspecified: Secondary | ICD-10-CM

## 2020-03-19 DIAGNOSIS — J3081 Allergic rhinitis due to animal (cat) (dog) hair and dander: Secondary | ICD-10-CM

## 2020-03-19 NOTE — Progress Notes (Signed)
Vials exp 03-19-21

## 2020-03-20 ENCOUNTER — Other Ambulatory Visit: Payer: Self-pay | Admitting: Allergy & Immunology

## 2020-03-20 DIAGNOSIS — J3089 Other allergic rhinitis: Secondary | ICD-10-CM | POA: Diagnosis not present

## 2020-03-21 DIAGNOSIS — E7849 Other hyperlipidemia: Secondary | ICD-10-CM | POA: Diagnosis not present

## 2020-03-21 DIAGNOSIS — I10 Essential (primary) hypertension: Secondary | ICD-10-CM | POA: Diagnosis not present

## 2020-03-21 DIAGNOSIS — E119 Type 2 diabetes mellitus without complications: Secondary | ICD-10-CM | POA: Diagnosis not present

## 2020-03-25 ENCOUNTER — Ambulatory Visit (INDEPENDENT_AMBULATORY_CARE_PROVIDER_SITE_OTHER): Payer: PPO

## 2020-03-25 DIAGNOSIS — J309 Allergic rhinitis, unspecified: Secondary | ICD-10-CM | POA: Diagnosis not present

## 2020-04-03 ENCOUNTER — Ambulatory Visit (INDEPENDENT_AMBULATORY_CARE_PROVIDER_SITE_OTHER): Payer: PPO

## 2020-04-03 DIAGNOSIS — J309 Allergic rhinitis, unspecified: Secondary | ICD-10-CM | POA: Diagnosis not present

## 2020-04-08 ENCOUNTER — Ambulatory Visit (INDEPENDENT_AMBULATORY_CARE_PROVIDER_SITE_OTHER): Payer: PPO

## 2020-04-08 DIAGNOSIS — J309 Allergic rhinitis, unspecified: Secondary | ICD-10-CM | POA: Diagnosis not present

## 2020-04-15 ENCOUNTER — Ambulatory Visit (INDEPENDENT_AMBULATORY_CARE_PROVIDER_SITE_OTHER): Payer: PPO

## 2020-04-15 DIAGNOSIS — J309 Allergic rhinitis, unspecified: Secondary | ICD-10-CM | POA: Diagnosis not present

## 2020-04-24 ENCOUNTER — Ambulatory Visit (INDEPENDENT_AMBULATORY_CARE_PROVIDER_SITE_OTHER): Payer: PPO

## 2020-04-24 DIAGNOSIS — J309 Allergic rhinitis, unspecified: Secondary | ICD-10-CM | POA: Diagnosis not present

## 2020-05-05 DIAGNOSIS — Z6836 Body mass index (BMI) 36.0-36.9, adult: Secondary | ICD-10-CM | POA: Diagnosis not present

## 2020-05-05 DIAGNOSIS — M7661 Achilles tendinitis, right leg: Secondary | ICD-10-CM | POA: Diagnosis not present

## 2020-05-08 ENCOUNTER — Ambulatory Visit (INDEPENDENT_AMBULATORY_CARE_PROVIDER_SITE_OTHER): Payer: PPO

## 2020-05-08 DIAGNOSIS — J309 Allergic rhinitis, unspecified: Secondary | ICD-10-CM

## 2020-05-20 DIAGNOSIS — E782 Mixed hyperlipidemia: Secondary | ICD-10-CM | POA: Diagnosis not present

## 2020-05-20 DIAGNOSIS — K2100 Gastro-esophageal reflux disease with esophagitis, without bleeding: Secondary | ICD-10-CM | POA: Diagnosis not present

## 2020-05-20 DIAGNOSIS — E119 Type 2 diabetes mellitus without complications: Secondary | ICD-10-CM | POA: Diagnosis not present

## 2020-05-20 DIAGNOSIS — I10 Essential (primary) hypertension: Secondary | ICD-10-CM | POA: Diagnosis not present

## 2020-05-22 ENCOUNTER — Ambulatory Visit (INDEPENDENT_AMBULATORY_CARE_PROVIDER_SITE_OTHER): Payer: Medicare HMO

## 2020-05-22 DIAGNOSIS — J309 Allergic rhinitis, unspecified: Secondary | ICD-10-CM

## 2020-05-26 DIAGNOSIS — E782 Mixed hyperlipidemia: Secondary | ICD-10-CM | POA: Diagnosis not present

## 2020-05-26 DIAGNOSIS — K2100 Gastro-esophageal reflux disease with esophagitis, without bleeding: Secondary | ICD-10-CM | POA: Diagnosis not present

## 2020-05-26 DIAGNOSIS — E7849 Other hyperlipidemia: Secondary | ICD-10-CM | POA: Diagnosis not present

## 2020-05-26 DIAGNOSIS — E7800 Pure hypercholesterolemia, unspecified: Secondary | ICD-10-CM | POA: Diagnosis not present

## 2020-05-26 DIAGNOSIS — R7989 Other specified abnormal findings of blood chemistry: Secondary | ICD-10-CM | POA: Diagnosis not present

## 2020-05-26 DIAGNOSIS — E7801 Familial hypercholesterolemia: Secondary | ICD-10-CM | POA: Diagnosis not present

## 2020-05-26 DIAGNOSIS — E119 Type 2 diabetes mellitus without complications: Secondary | ICD-10-CM | POA: Diagnosis not present

## 2020-05-26 DIAGNOSIS — I10 Essential (primary) hypertension: Secondary | ICD-10-CM | POA: Diagnosis not present

## 2020-05-29 DIAGNOSIS — R945 Abnormal results of liver function studies: Secondary | ICD-10-CM | POA: Diagnosis not present

## 2020-05-29 DIAGNOSIS — Z0001 Encounter for general adult medical examination with abnormal findings: Secondary | ICD-10-CM | POA: Diagnosis not present

## 2020-05-29 DIAGNOSIS — E7849 Other hyperlipidemia: Secondary | ICD-10-CM | POA: Diagnosis not present

## 2020-05-29 DIAGNOSIS — E1159 Type 2 diabetes mellitus with other circulatory complications: Secondary | ICD-10-CM | POA: Diagnosis not present

## 2020-05-29 DIAGNOSIS — M7660 Achilles tendinitis, unspecified leg: Secondary | ICD-10-CM | POA: Diagnosis not present

## 2020-05-29 DIAGNOSIS — Z23 Encounter for immunization: Secondary | ICD-10-CM | POA: Diagnosis not present

## 2020-05-29 DIAGNOSIS — N521 Erectile dysfunction due to diseases classified elsewhere: Secondary | ICD-10-CM | POA: Diagnosis not present

## 2020-05-29 DIAGNOSIS — I10 Essential (primary) hypertension: Secondary | ICD-10-CM | POA: Diagnosis not present

## 2020-06-03 ENCOUNTER — Ambulatory Visit (INDEPENDENT_AMBULATORY_CARE_PROVIDER_SITE_OTHER): Payer: Medicare HMO

## 2020-06-03 DIAGNOSIS — J309 Allergic rhinitis, unspecified: Secondary | ICD-10-CM

## 2020-06-15 DIAGNOSIS — J3081 Allergic rhinitis due to animal (cat) (dog) hair and dander: Secondary | ICD-10-CM | POA: Diagnosis not present

## 2020-06-15 NOTE — Progress Notes (Signed)
VIALS EXP 06-15-21 

## 2020-06-16 ENCOUNTER — Other Ambulatory Visit: Payer: Self-pay | Admitting: Allergy & Immunology

## 2020-06-17 ENCOUNTER — Ambulatory Visit (INDEPENDENT_AMBULATORY_CARE_PROVIDER_SITE_OTHER): Payer: Medicare HMO

## 2020-06-17 DIAGNOSIS — J309 Allergic rhinitis, unspecified: Secondary | ICD-10-CM

## 2020-06-18 DIAGNOSIS — J3089 Other allergic rhinitis: Secondary | ICD-10-CM | POA: Diagnosis not present

## 2020-06-20 DIAGNOSIS — E7849 Other hyperlipidemia: Secondary | ICD-10-CM | POA: Diagnosis not present

## 2020-06-20 DIAGNOSIS — I10 Essential (primary) hypertension: Secondary | ICD-10-CM | POA: Diagnosis not present

## 2020-06-20 DIAGNOSIS — E119 Type 2 diabetes mellitus without complications: Secondary | ICD-10-CM | POA: Diagnosis not present

## 2020-07-01 ENCOUNTER — Ambulatory Visit (INDEPENDENT_AMBULATORY_CARE_PROVIDER_SITE_OTHER): Payer: Medicare HMO

## 2020-07-01 DIAGNOSIS — J309 Allergic rhinitis, unspecified: Secondary | ICD-10-CM

## 2020-07-08 DIAGNOSIS — I10 Essential (primary) hypertension: Secondary | ICD-10-CM | POA: Diagnosis not present

## 2020-07-08 DIAGNOSIS — J309 Allergic rhinitis, unspecified: Secondary | ICD-10-CM | POA: Diagnosis not present

## 2020-07-08 DIAGNOSIS — R69 Illness, unspecified: Secondary | ICD-10-CM | POA: Diagnosis not present

## 2020-07-08 DIAGNOSIS — K08409 Partial loss of teeth, unspecified cause, unspecified class: Secondary | ICD-10-CM | POA: Diagnosis not present

## 2020-07-08 DIAGNOSIS — H269 Unspecified cataract: Secondary | ICD-10-CM | POA: Diagnosis not present

## 2020-07-08 DIAGNOSIS — E785 Hyperlipidemia, unspecified: Secondary | ICD-10-CM | POA: Diagnosis not present

## 2020-07-08 DIAGNOSIS — M1990 Unspecified osteoarthritis, unspecified site: Secondary | ICD-10-CM | POA: Diagnosis not present

## 2020-07-08 DIAGNOSIS — E669 Obesity, unspecified: Secondary | ICD-10-CM | POA: Diagnosis not present

## 2020-07-15 ENCOUNTER — Ambulatory Visit (INDEPENDENT_AMBULATORY_CARE_PROVIDER_SITE_OTHER): Payer: Medicare HMO

## 2020-07-15 DIAGNOSIS — J309 Allergic rhinitis, unspecified: Secondary | ICD-10-CM | POA: Diagnosis not present

## 2020-07-20 DIAGNOSIS — K2100 Gastro-esophageal reflux disease with esophagitis, without bleeding: Secondary | ICD-10-CM | POA: Diagnosis not present

## 2020-07-20 DIAGNOSIS — E7849 Other hyperlipidemia: Secondary | ICD-10-CM | POA: Diagnosis not present

## 2020-07-20 DIAGNOSIS — E119 Type 2 diabetes mellitus without complications: Secondary | ICD-10-CM | POA: Diagnosis not present

## 2020-07-20 DIAGNOSIS — I10 Essential (primary) hypertension: Secondary | ICD-10-CM | POA: Diagnosis not present

## 2020-07-29 ENCOUNTER — Ambulatory Visit (INDEPENDENT_AMBULATORY_CARE_PROVIDER_SITE_OTHER): Payer: Medicare HMO

## 2020-07-29 DIAGNOSIS — J309 Allergic rhinitis, unspecified: Secondary | ICD-10-CM

## 2020-07-29 MED ORDER — IPRATROPIUM BROMIDE 0.06 % NA SOLN: NASAL | 1 refills | Status: DC

## 2020-07-29 MED ORDER — MOMETASONE FUROATE 50 MCG/ACT NA SUSP
2.0000 | Freq: Every day | NASAL | 1 refills | Status: DC
Start: 2020-07-29 — End: 2021-02-08

## 2020-07-29 MED ORDER — AZELASTINE HCL 0.1 % NA SOLN
NASAL | 1 refills | Status: DC
Start: 2020-07-29 — End: 2021-01-28

## 2020-07-31 DIAGNOSIS — Z0100 Encounter for examination of eyes and vision without abnormal findings: Secondary | ICD-10-CM | POA: Diagnosis not present

## 2020-07-31 DIAGNOSIS — H5200 Hypermetropia, unspecified eye: Secondary | ICD-10-CM | POA: Diagnosis not present

## 2020-07-31 DIAGNOSIS — E119 Type 2 diabetes mellitus without complications: Secondary | ICD-10-CM | POA: Diagnosis not present

## 2020-08-05 ENCOUNTER — Ambulatory Visit (INDEPENDENT_AMBULATORY_CARE_PROVIDER_SITE_OTHER): Payer: Medicare HMO

## 2020-08-05 DIAGNOSIS — J309 Allergic rhinitis, unspecified: Secondary | ICD-10-CM

## 2020-08-12 ENCOUNTER — Ambulatory Visit (INDEPENDENT_AMBULATORY_CARE_PROVIDER_SITE_OTHER): Payer: Medicare HMO

## 2020-08-12 DIAGNOSIS — J309 Allergic rhinitis, unspecified: Secondary | ICD-10-CM | POA: Diagnosis not present

## 2020-08-20 DIAGNOSIS — I10 Essential (primary) hypertension: Secondary | ICD-10-CM | POA: Diagnosis not present

## 2020-08-20 DIAGNOSIS — E7849 Other hyperlipidemia: Secondary | ICD-10-CM | POA: Diagnosis not present

## 2020-08-20 DIAGNOSIS — K2100 Gastro-esophageal reflux disease with esophagitis, without bleeding: Secondary | ICD-10-CM | POA: Diagnosis not present

## 2020-08-20 DIAGNOSIS — E119 Type 2 diabetes mellitus without complications: Secondary | ICD-10-CM | POA: Diagnosis not present

## 2020-08-26 ENCOUNTER — Ambulatory Visit (INDEPENDENT_AMBULATORY_CARE_PROVIDER_SITE_OTHER): Payer: Medicare HMO

## 2020-08-26 DIAGNOSIS — J309 Allergic rhinitis, unspecified: Secondary | ICD-10-CM | POA: Diagnosis not present

## 2020-09-02 ENCOUNTER — Ambulatory Visit (INDEPENDENT_AMBULATORY_CARE_PROVIDER_SITE_OTHER): Payer: Medicare HMO

## 2020-09-02 DIAGNOSIS — J309 Allergic rhinitis, unspecified: Secondary | ICD-10-CM | POA: Diagnosis not present

## 2020-09-09 ENCOUNTER — Ambulatory Visit (INDEPENDENT_AMBULATORY_CARE_PROVIDER_SITE_OTHER): Payer: Medicare HMO | Admitting: *Deleted

## 2020-09-09 DIAGNOSIS — J309 Allergic rhinitis, unspecified: Secondary | ICD-10-CM

## 2020-09-20 DIAGNOSIS — K2100 Gastro-esophageal reflux disease with esophagitis, without bleeding: Secondary | ICD-10-CM | POA: Diagnosis not present

## 2020-09-20 DIAGNOSIS — E119 Type 2 diabetes mellitus without complications: Secondary | ICD-10-CM | POA: Diagnosis not present

## 2020-09-20 DIAGNOSIS — E7849 Other hyperlipidemia: Secondary | ICD-10-CM | POA: Diagnosis not present

## 2020-09-20 DIAGNOSIS — I10 Essential (primary) hypertension: Secondary | ICD-10-CM | POA: Diagnosis not present

## 2020-09-25 ENCOUNTER — Ambulatory Visit (INDEPENDENT_AMBULATORY_CARE_PROVIDER_SITE_OTHER): Payer: Medicare HMO

## 2020-09-25 DIAGNOSIS — J309 Allergic rhinitis, unspecified: Secondary | ICD-10-CM | POA: Diagnosis not present

## 2020-10-05 DIAGNOSIS — Z6834 Body mass index (BMI) 34.0-34.9, adult: Secondary | ICD-10-CM | POA: Diagnosis not present

## 2020-10-05 DIAGNOSIS — B3742 Candidal balanitis: Secondary | ICD-10-CM | POA: Diagnosis not present

## 2020-10-07 ENCOUNTER — Ambulatory Visit (INDEPENDENT_AMBULATORY_CARE_PROVIDER_SITE_OTHER): Payer: Medicare HMO

## 2020-10-07 DIAGNOSIS — J309 Allergic rhinitis, unspecified: Secondary | ICD-10-CM | POA: Diagnosis not present

## 2020-10-07 DIAGNOSIS — K2100 Gastro-esophageal reflux disease with esophagitis, without bleeding: Secondary | ICD-10-CM | POA: Diagnosis not present

## 2020-10-07 DIAGNOSIS — E782 Mixed hyperlipidemia: Secondary | ICD-10-CM | POA: Diagnosis not present

## 2020-10-07 DIAGNOSIS — E7849 Other hyperlipidemia: Secondary | ICD-10-CM | POA: Diagnosis not present

## 2020-10-07 DIAGNOSIS — R7989 Other specified abnormal findings of blood chemistry: Secondary | ICD-10-CM | POA: Diagnosis not present

## 2020-10-07 DIAGNOSIS — E119 Type 2 diabetes mellitus without complications: Secondary | ICD-10-CM | POA: Diagnosis not present

## 2020-10-07 DIAGNOSIS — E7800 Pure hypercholesterolemia, unspecified: Secondary | ICD-10-CM | POA: Diagnosis not present

## 2020-10-07 DIAGNOSIS — E7801 Familial hypercholesterolemia: Secondary | ICD-10-CM | POA: Diagnosis not present

## 2020-10-08 DIAGNOSIS — M25561 Pain in right knee: Secondary | ICD-10-CM | POA: Diagnosis not present

## 2020-10-08 DIAGNOSIS — F5221 Male erectile disorder: Secondary | ICD-10-CM | POA: Diagnosis not present

## 2020-10-08 DIAGNOSIS — R69 Illness, unspecified: Secondary | ICD-10-CM | POA: Diagnosis not present

## 2020-10-08 DIAGNOSIS — J3081 Allergic rhinitis due to animal (cat) (dog) hair and dander: Secondary | ICD-10-CM | POA: Diagnosis not present

## 2020-10-08 DIAGNOSIS — E7849 Other hyperlipidemia: Secondary | ICD-10-CM | POA: Diagnosis not present

## 2020-10-08 DIAGNOSIS — N521 Erectile dysfunction due to diseases classified elsewhere: Secondary | ICD-10-CM | POA: Diagnosis not present

## 2020-10-08 DIAGNOSIS — E1159 Type 2 diabetes mellitus with other circulatory complications: Secondary | ICD-10-CM | POA: Diagnosis not present

## 2020-10-08 DIAGNOSIS — I10 Essential (primary) hypertension: Secondary | ICD-10-CM | POA: Diagnosis not present

## 2020-10-08 DIAGNOSIS — Z23 Encounter for immunization: Secondary | ICD-10-CM | POA: Diagnosis not present

## 2020-10-08 NOTE — Progress Notes (Signed)
VIALS MADE. EXP 10-08-21 

## 2020-10-09 DIAGNOSIS — J3089 Other allergic rhinitis: Secondary | ICD-10-CM | POA: Diagnosis not present

## 2020-10-20 DIAGNOSIS — B009 Herpesviral infection, unspecified: Secondary | ICD-10-CM | POA: Diagnosis not present

## 2020-10-20 DIAGNOSIS — Z6835 Body mass index (BMI) 35.0-35.9, adult: Secondary | ICD-10-CM | POA: Diagnosis not present

## 2020-10-23 ENCOUNTER — Ambulatory Visit (INDEPENDENT_AMBULATORY_CARE_PROVIDER_SITE_OTHER): Payer: Medicare HMO

## 2020-10-23 DIAGNOSIS — J309 Allergic rhinitis, unspecified: Secondary | ICD-10-CM | POA: Diagnosis not present

## 2020-11-03 DIAGNOSIS — Z6835 Body mass index (BMI) 35.0-35.9, adult: Secondary | ICD-10-CM | POA: Diagnosis not present

## 2020-11-03 DIAGNOSIS — M545 Low back pain, unspecified: Secondary | ICD-10-CM | POA: Diagnosis not present

## 2020-11-03 DIAGNOSIS — B009 Herpesviral infection, unspecified: Secondary | ICD-10-CM | POA: Diagnosis not present

## 2020-11-06 ENCOUNTER — Ambulatory Visit (INDEPENDENT_AMBULATORY_CARE_PROVIDER_SITE_OTHER): Payer: Medicare HMO | Admitting: *Deleted

## 2020-11-06 DIAGNOSIS — J309 Allergic rhinitis, unspecified: Secondary | ICD-10-CM

## 2020-11-17 DIAGNOSIS — Z7689 Persons encountering health services in other specified circumstances: Secondary | ICD-10-CM | POA: Diagnosis not present

## 2020-11-17 DIAGNOSIS — Z6835 Body mass index (BMI) 35.0-35.9, adult: Secondary | ICD-10-CM | POA: Diagnosis not present

## 2020-11-17 DIAGNOSIS — Z23 Encounter for immunization: Secondary | ICD-10-CM | POA: Diagnosis not present

## 2020-11-17 DIAGNOSIS — N492 Inflammatory disorders of scrotum: Secondary | ICD-10-CM | POA: Diagnosis not present

## 2020-11-25 ENCOUNTER — Ambulatory Visit (INDEPENDENT_AMBULATORY_CARE_PROVIDER_SITE_OTHER): Payer: Medicare HMO

## 2020-11-25 DIAGNOSIS — J309 Allergic rhinitis, unspecified: Secondary | ICD-10-CM | POA: Diagnosis not present

## 2020-12-01 ENCOUNTER — Other Ambulatory Visit: Payer: Self-pay

## 2020-12-01 ENCOUNTER — Ambulatory Visit (INDEPENDENT_AMBULATORY_CARE_PROVIDER_SITE_OTHER): Payer: Medicare HMO | Admitting: Urology

## 2020-12-01 ENCOUNTER — Encounter: Payer: Self-pay | Admitting: Urology

## 2020-12-01 VITALS — BP 158/99 | HR 108

## 2020-12-01 DIAGNOSIS — R21 Rash and other nonspecific skin eruption: Secondary | ICD-10-CM

## 2020-12-01 LAB — URINALYSIS, ROUTINE W REFLEX MICROSCOPIC
Bilirubin, UA: NEGATIVE
Glucose, UA: NEGATIVE
Ketones, UA: NEGATIVE
Leukocytes,UA: NEGATIVE
Nitrite, UA: NEGATIVE
Protein,UA: NEGATIVE
RBC, UA: NEGATIVE
Specific Gravity, UA: 1.015 (ref 1.005–1.030)
Urobilinogen, Ur: 0.2 mg/dL (ref 0.2–1.0)
pH, UA: 7 (ref 5.0–7.5)

## 2020-12-01 MED ORDER — CLOTRIMAZOLE-BETAMETHASONE 1-0.05 % EX CREA: 1.0000 "application " | TOPICAL_CREAM | Freq: Two times a day (BID) | CUTANEOUS | 0 refills | Status: DC

## 2020-12-01 NOTE — Progress Notes (Signed)
Assessment: 1. Scrotal rash      Plan: The scrotal rash currently has the appearance of a folliculitis.  This does not have the typical appearance of any STD type lesion.  Patient was reassured. Trial of Lotrisone cream twice daily to scrotum. Warm moist compresses to scrotum as needed Return to office in 4-6 weeks  Chief Complaint:  Chief Complaint  Patient presents with   scrotal rash    History of Present Illness:  [name] Harper is a 60 y.o. year old male who is seen in consultation from Manon Hilding, MD  for evaluation of scrotal lesions.  He noted onset of lesions involving the scrotum and penis approximately 6 weeks ago.  This began after a sexual encounter.  He reports white spots on the scrotum and penile shaft with associated itching.  He has been treated with valacyclovir for approximately 4 weeks.  He is also been treated with Keflex and mupirocin ointment.  He reports some slight improvement in his symptoms.  He continues to have whitish areas on the scrotum with associated itching.  He is not having any urethral discharge.  No dysuria or gross hematuria.  No history of STDs.   Past Medical History:  Past Medical History:  Diagnosis Date   Anxiety    Arthritis    Bronchitis    Complication of anesthesia    Depression    Hypercholesterolemia    Hypertension    PONV (postoperative nausea and vomiting)    Pre-diabetes    pt stated    Past Surgical History:  Past Surgical History:  Procedure Laterality Date   ABDOMINAL SURGERY  2007   hiatal hernia repair   BIOPSY  03/28/2017   Procedure: BIOPSY;  Surgeon: Danie Binder, MD;  [facility];  Service: Endoscopy;;  gastric bx's and gastric polyp   COLONOSCOPY WITH PROPOFOL N/A 03/28/2017   Dr. Oneida Alar: Few small and large mouth diverticula in the rectosigmoid colon, sigmoid colon, descending colon.  External and internal hemorrhoids noted.  One descending colon polyp removed, pathology benign.  Return  colonoscopy in 10 years.   ESOPHAGEAL MANOMETRY N/A 10/10/2018   Procedure: ESOPHAGEAL MANOMETRY (EM);  Surgeon: Mauri Pole, MD;  Location: [facility];  Service: Endoscopy;  Laterality: N/A;   ESOPHAGOGASTRODUODENOSCOPY (EGD) WITH PROPOFOL N/A 03/28/2017   Dr. Oneida Alar: Patchy mild inflammation and erythema in the gastric antrum, negative H. pylori on biopsies.  On biopsy he had chronic gastritis, negative for dysplasia.     ESOPHAGOGASTRODUODENOSCOPY (EGD) WITH PROPOFOL N/A 09/24/2018   Procedure: ESOPHAGOGASTRODUODENOSCOPY (EGD) WITH PROPOFOL;  Surgeon: Daneil Dolin, MD;  [facility];  Service: Endoscopy;  Laterality: N/A;  2:45pm   KNEE ARTHROSCOPY  2016   rt knee   PH IMPEDANCE STUDY N/A 10/10/2018   Procedure: [place] IMPEDANCE STUDY;  Surgeon: Mauri Pole, MD;  Location: [facility];  Service: Endoscopy;  Laterality: N/A;   POLYPECTOMY  03/28/2017   Procedure: POLYPECTOMY;  Surgeon: Danie Binder, MD;  [facility];  Service: Endoscopy;;  descending colon polyp   TOTAL KNEE ARTHROPLASTY Right 10/29/2014   Procedure: RIGHT TOTAL KNEE ARTHROPLASTY;  Surgeon: Latanya Maudlin, MD;  Location: [facility];  Service: Orthopedics;  Laterality: Right;    Allergies:  No Known Allergies  Family History:  Family History  Problem Relation Age of Onset   Lung disease Mother    Colon cancer Neg Hx     Social History:  Social History  Tobacco Use   Smoking status: Never   Smokeless tobacco: Never  Vaping Use   Vaping Use: Never used  Substance Use Topics   Alcohol use: No    Alcohol/week: 0.0 standard drinks   Drug use: No    Review of symptoms:  Constitutional:  Negative for unexplained weight loss, night sweats, fever, chills ENT:  Negative for nose bleeds, sinus pain, painful swallowing CV:  Negative for chest pain, shortness of breath, exercise intolerance, palpitations, loss of consciousness Resp:  Negative for cough, wheezing, shortness of  breath GI:  Negative for nausea, vomiting, diarrhea, bloody stools GU:  Positives noted in HPI; otherwise negative for gross hematuria, dysuria, urinary incontinence Neuro:  Negative for seizures, poor balance, limb weakness, slurred speech Psych:  Negative for lack of energy, depression, anxiety Endocrine:  Negative for polydipsia, polyuria, symptoms of hypoglycemia (dizziness, hunger, sweating) Hematologic:  Negative for anemia, purpura, petechia, prolonged or excessive bleeding, use of anticoagulants  Allergic:  Negative for difficulty breathing or choking as a result of exposure to anything; no shellfish allergy; no allergic response (rash/itch) to materials, foods  Physical exam: BP (!) 158/99   Pulse (!) 108  GENERAL APPEARANCE:  Well appearing, well developed, well nourished, NAD HEENT: Atraumatic, Normocephalic, oropharynx clear. NECK: Supple without lymphadenopathy or thyromegaly. LUNGS: Clear to auscultation bilaterally. HEART: Regular Rate and Rhythm without murmurs, gallops, or rubs. ABDOMEN: Soft, non-tender, No Masses. EXTREMITIES: Moves all extremities well.  Without clubbing, cyanosis, or edema. NEUROLOGIC:  Alert and oriented x 3, normal gait, CN II-XII grossly intact.  MENTAL STATUS:  Appropriate. BACK:  Non-tender to palpation.  No CVAT SKIN:  Warm, dry and intact.   GU: Penis:  circumcised, no lesions on glans or shaft Meatus: Normal Scrotum: multiple small whitish colored nodules on scrotum, some with overlying dry scab, no discharge or erythema, no blisters Testis: normal without masses bilateral    Results: U/A dipstick:  negative

## 2020-12-09 ENCOUNTER — Ambulatory Visit (INDEPENDENT_AMBULATORY_CARE_PROVIDER_SITE_OTHER): Payer: Medicare HMO

## 2020-12-09 DIAGNOSIS — J309 Allergic rhinitis, unspecified: Secondary | ICD-10-CM | POA: Diagnosis not present

## 2020-12-23 ENCOUNTER — Ambulatory Visit (INDEPENDENT_AMBULATORY_CARE_PROVIDER_SITE_OTHER): Payer: Medicare HMO

## 2020-12-23 DIAGNOSIS — J309 Allergic rhinitis, unspecified: Secondary | ICD-10-CM | POA: Diagnosis not present

## 2021-01-04 ENCOUNTER — Ambulatory Visit: Payer: Medicare HMO | Admitting: Urology

## 2021-01-04 ENCOUNTER — Other Ambulatory Visit: Payer: Self-pay

## 2021-01-04 ENCOUNTER — Encounter: Payer: Self-pay | Admitting: Urology

## 2021-01-04 VITALS — BP 150/92 | HR 90 | Temp 99.0°F

## 2021-01-04 DIAGNOSIS — R21 Rash and other nonspecific skin eruption: Secondary | ICD-10-CM | POA: Diagnosis not present

## 2021-01-04 MED ORDER — CLOTRIMAZOLE-BETAMETHASONE 1-0.05 % EX CREA: 1.0000 "application " | TOPICAL_CREAM | Freq: Two times a day (BID) | CUTANEOUS | 1 refills | Status: DC

## 2021-01-04 NOTE — Progress Notes (Signed)

## 2021-01-04 NOTE — Progress Notes (Signed)
Assessment: 1. Scrotal rash     Plan: His rash as resolved. Continue Lotrisone cream twice daily prn Return to office prn  Chief Complaint: Chief Complaint  Patient presents with   scrotal rash    HPI: [name] Harper is a 60 y.o. male who presents for continued evaluation of scrotal lesions.  At his initial visit in 10/22, he noted onset of lesions involving the scrotum and penis approximately 6 weeks prior.  This began after a sexual encounter.  He reported white spots on the scrotum and penile shaft with associated itching.  He was treated with valacyclovir for approximately 4 weeks.  He was also treated with Keflex and mupirocin ointment.  He reported some slight improvement in his symptoms.  He continued to have whitish areas on the scrotum with associated itching.  No history of urethral discharge.  No dysuria or gross hematuria.  No history of STDs. He was treated with Lotrisone cream.  He returns today for follow-up.  The rash on the scrotum and penis has resolved completely.  He is not having any current symptoms.   Portions of the above documentation were copied from a prior visit for review purposes only.  Allergies: No Known Allergies  PMH: Past Medical History:  Diagnosis Date   Anxiety    Arthritis    Bronchitis    Complication of anesthesia    Depression    Hypercholesterolemia    Hypertension    PONV (postoperative nausea and vomiting)    Pre-diabetes    pt stated    PSH: Past Surgical History:  Procedure Laterality Date   ABDOMINAL SURGERY  2007   hiatal hernia repair   BIOPSY  03/28/2017   Procedure: BIOPSY;  Surgeon: Danie Binder, MD;  [facility];  Service: Endoscopy;;  gastric bx's and gastric polyp   COLONOSCOPY WITH PROPOFOL N/A 03/28/2017   Dr. Oneida Alar: Few small and large mouth diverticula in the rectosigmoid colon, sigmoid colon, descending colon.  External and internal hemorrhoids noted.  One descending colon polyp removed,  pathology benign.  Return colonoscopy in 10 years.   ESOPHAGEAL MANOMETRY N/A 10/10/2018   Procedure: ESOPHAGEAL MANOMETRY (EM);  Surgeon: Mauri Pole, MD;  [facility];  Service: Endoscopy;  Laterality: N/A;   ESOPHAGOGASTRODUODENOSCOPY (EGD) WITH PROPOFOL N/A 03/28/2017   Dr. Oneida Alar: Patchy mild inflammation and erythema in the gastric antrum, negative H. pylori on biopsies.  On biopsy he had chronic gastritis, negative for dysplasia.     ESOPHAGOGASTRODUODENOSCOPY (EGD) WITH PROPOFOL N/A 09/24/2018   Procedure: ESOPHAGOGASTRODUODENOSCOPY (EGD) WITH PROPOFOL;  Surgeon: Daneil Dolin, MD;  [facility];  Service: Endoscopy;  Laterality: N/A;  2:45pm   KNEE ARTHROSCOPY  2016   rt knee   PH IMPEDANCE STUDY N/A 10/10/2018   Procedure: [place] IMPEDANCE STUDY;  Surgeon: Mauri Pole, MD;  [facility];  Service: Endoscopy;  Laterality: N/A;   POLYPECTOMY  03/28/2017   Procedure: POLYPECTOMY;  Surgeon: Danie Binder, MD;  [facility];  Service: Endoscopy;;  descending colon polyp   TOTAL KNEE ARTHROPLASTY Right 10/29/2014   Procedure: RIGHT TOTAL KNEE ARTHROPLASTY;  Surgeon: Latanya Maudlin, MD;  [facility];  Service: Orthopedics;  Laterality: Right;    SH: Social History   Tobacco Use   Smoking status: Never   Smokeless tobacco: Never  Vaping Use   Vaping Use: Never used  Substance Use Topics   Alcohol use: No    Alcohol/week: 0.0  standard drinks   Drug use: No    ROS: Constitutional:  Negative for fever, chills, weight loss CV: Negative for chest pain, previous MI, hypertension Respiratory:  Negative for shortness of breath, wheezing, sleep apnea, frequent cough GI:  Negative for nausea, vomiting, bloody stool, GERD  PE: BP (!) 150/92   Pulse 90   Temp 99 F (37.2 C)  GENERAL APPEARANCE:  Well appearing, well developed, well nourished, NAD HEENT:  Atraumatic, normocephalic, oropharynx clear NECK:  Supple without  lymphadenopathy or thyromegaly ABDOMEN:  Soft, non-tender, no masses EXTREMITIES:  Moves all extremities well, without clubbing, cyanosis, or edema NEUROLOGIC:  Alert and oriented x 3, normal gait, CN II-XII grossly intact MENTAL STATUS:  appropriate BACK:  Non-tender to palpation, No CVAT SKIN:  Warm, dry, and intact GU: Penis:  circumcised, no lesions Meatus: Normal Scrotum: normal, no masses, no rash or skin lesions   Results: None

## 2021-01-06 ENCOUNTER — Other Ambulatory Visit: Payer: Self-pay

## 2021-01-06 ENCOUNTER — Encounter: Payer: Self-pay | Admitting: Allergy & Immunology

## 2021-01-06 ENCOUNTER — Ambulatory Visit: Payer: Medicare HMO | Admitting: Allergy & Immunology

## 2021-01-06 ENCOUNTER — Ambulatory Visit: Payer: Self-pay

## 2021-01-06 VITALS — BP 118/88 | HR 75 | Temp 98.3°F | Resp 16 | Ht 64.0 in | Wt 185.4 lb

## 2021-01-06 DIAGNOSIS — J302 Other seasonal allergic rhinitis: Secondary | ICD-10-CM | POA: Diagnosis not present

## 2021-01-06 DIAGNOSIS — K219 Gastro-esophageal reflux disease without esophagitis: Secondary | ICD-10-CM | POA: Diagnosis not present

## 2021-01-06 DIAGNOSIS — J309 Allergic rhinitis, unspecified: Secondary | ICD-10-CM | POA: Diagnosis not present

## 2021-01-06 DIAGNOSIS — K1370 Unspecified lesions of oral mucosa: Secondary | ICD-10-CM | POA: Diagnosis not present

## 2021-01-06 MED ORDER — TRIAMCINOLONE ACETONIDE 0.1 % MT PSTE: 1.0000 "application " | PASTE | Freq: Two times a day (BID) | OROMUCOSAL | 3 refills | Status: DC

## 2021-01-06 NOTE — Patient Instructions (Addendum)
1. Seasonal and perennial allergic rhinitis (horse, grasses, ragweed, weeds, trees, indoor molds, dust mites, cat, dog and cockroach) - Continue with allergy shots at the same schedule.  - Continue with: Xyzal 5mg  daily as well as the nasal sprays as you are doing. - Continue to wean down on the nasal sprays as you are doing.   2. Gastroesophageal reflux disease - Continue with your dietary changes.   3. Oral lesions - I sent in triamcinolone orabase to use twice daily for these lesions. - Don't drink anything after using it.  - Let Caryl Pina know if this is not working and we can refer you to see ENT.   4. Return in about 1 year (around 01/06/2022).   Please inform us of any Emergency Department visits, hospitalizations, or changes in symptoms. Call us before going to the ED for breathing or allergy symptoms since we might be able to fit you in for a sick visit. Feel free to contact us anytime with any questions, problems, or concerns.  It was a pleasure to see you again today!  Websites that have reliable patient information: 1. American Academy of Asthma, Allergy, and Immunology: www.aaaai.org 2. Food Allergy Research and Education (FARE): foodallergy.org 3. Mothers of Asthmatics: http://www.asthmacommunitynetwork.org 4. American College of Allergy, Asthma, and Immunology: www.acaai.org   COVID-19 Vaccine Information can be found at: ShippingScam.co.uk For questions related to vaccine distribution or appointments, please email [email or web address] or call [phone].     "Like" [place] on Facebook and Instagram for our latest updates!     HAPPY FALL!     Make sure you are registered to vote! If you have moved or changed any of your contact information, you will need to get this updated before voting!  In some cases, you MAY be able to register to vote online: [email or web address]

## 2021-01-06 NOTE — Progress Notes (Signed)
FOLLOW UP  Date of Service/Encounter:  01/06/21   Assessment:    Seasonal and perennial allergic rhinitis - on allergen immunotherapy with excellent  control of symptoms   Gastroesophageal reflux disease - off of all medications   Cough - resolved   Oral lesions including on the time  Plan/Recommendations:   1. Seasonal and perennial allergic rhinitis (horse, grasses, ragweed, weeds, trees, indoor molds, dust mites, cat, dog and cockroach) - Continue with allergy shots at the same schedule.  - Continue with: Xyzal 5mg  daily as well as the nasal sprays as you are doing. - Continue to wean down on the nasal sprays as you are doing.   2. Gastroesophageal reflux disease - Continue with your dietary changes.   3. Oral lesions - I sent in triamcinolone orabase to use twice daily for these lesions. - Don't drink anything after using it.  - Let Caryl Pina know if this is not working and we can refer you to see ENT.   4. Return in about 1 year (around 01/06/2022).   Subjective:   [name] Harper is a 60 y.o. male presenting today for follow up of  Chief Complaint  Patient presents with   Allergic Rhinitis     Yearly visit - going good    [name] Harper has a history of the following: Patient Active Problem List   Diagnosis Date Noted   Seasonal and perennial allergic rhinitis 07/12/2019   Cough 07/12/2019   Non-intractable vomiting    Fatty liver 01/25/2018   Chronic cough 01/25/2018   Chronic superficial gastritis without bleeding    Polyp of descending colon    Encounter for screening colonoscopy 03/02/2017   Abnormal LFTs 03/02/2017   Nausea without vomiting 03/02/2017   Post-nasal drip 11/29/2016   Perennial allergic rhinitis 11/29/2016   Gastroesophageal reflux disease 11/29/2016   History of total knee arthroplasty 10/29/2014    History obtained from: chart review and patient.  [name] Harper is a 60 y.o. male presenting for a follow up visit.  He is well-known to  this practice.  He was last seen in November 2021.  At that time, we continue with his allergy shots at the same schedule.  We also continue with Xyzal 5 mg daily.  He does have nasal sprays that he uses on a as needed basis.  He was aggressively weaning these nasal sprays because he was feeling better on the shots.  For his reflux, he continue with his dietary changes.  Since last visit, he has done very well.  Allergic Rhinitis Symptom History: He has been using his nasal sprays only as needed. He does not use them every day. He is using his levocetirizine daily but he is interested in making this as needed. Allergy shots are going well.  He has gotten rid of all of his animals. He seems to like the freedom of not having the animals every where. Shots are going very well. He has not sinus infections or pneumonias.   [place] is on allergen immunotherapy. He receives two injections. Immunotherapy script #1 contains ragweed, molds, dust mites and cockroach. He currently receives 0.36mL of the RED vial (1/100). Immunotherapy script #2 contains trees, grasses, cat and dog. He currently receives 0.28mL of the RED vial (1/100). He started shots April of 2021 and reached maintenance in August of 2021. He is very happy with how he is doing symptomatically.   Overall, he is doing well.  He did have a cough at 1 point  this has improved.  I felt this was related to GERD, and we started him on a PPI.  We ended up sending him to GI where it was noted that he had elevated LFTs.  I never did see the GI notes, but he tells me that his LFTs are all improving. Gastritis is under good control.  He is no longer having any of the abdominal pain that he was having in 2018 and 2019.  He was also having a lot of phlegm production, which has not been the case since then.  He does report some sores on his tongue. He has been having issues for 3 weeks or so. He has never had this before.  He has never seen anyone about this.  He has  not talked to his primary care doctor about this.  Overall, he is doing fairly good.  His wife passed away in the last couple years, and he was able to get rid of a lot of her animals.  He is now animal free which has helped his allergic rhinitis.  He does feel better knowing that his wife is no longer suffering.  He never had any children of his own, but his wife had some children before they got married.  He now has 7 grandchildren.  Otherwise, there have been no changes to his past medical history, surgical history, family history, or social history.    Review of Systems  Constitutional: Negative.  Negative for fever, malaise/fatigue and weight loss.  HENT: Negative.  Negative for congestion, ear discharge and ear pain.   Eyes:  Negative for pain, discharge and redness.  Respiratory:  Negative for cough, sputum production, shortness of breath and wheezing.   Cardiovascular: Negative.  Negative for chest pain and palpitations.  Gastrointestinal:  Negative for abdominal pain, constipation, diarrhea, heartburn, nausea and vomiting.  Skin: Negative.  Negative for itching and rash.  Neurological:  Negative for dizziness and headaches.  Endo/Heme/Allergies:  Negative for environmental allergies. Does not bruise/bleed easily.      Objective:   Blood pressure 118/88, pulse 75, temperature 98.3 F (36.8 C), resp. rate 16, height 5\' 4"  (1.626 m), weight 185 lb 6.4 oz (84.1 kg), SpO2 96 %. Body mass index is 31.82 kg/m.   Physical Exam:  Physical Exam Vitals reviewed.  Constitutional:      Appearance: He is well-developed.     Comments: Very pleasant.  HENT:     Head: Normocephalic and atraumatic.     Right Ear: Tympanic membrane, ear canal and external ear normal.     Left Ear: Tympanic membrane, ear canal and external ear normal.     Nose: No nasal deformity, septal deviation, mucosal edema or rhinorrhea.     Right Turbinates: Enlarged and swollen.     Left Turbinates: Enlarged  and swollen.     Right Sinus: No maxillary sinus tenderness or frontal sinus tenderness.     Left Sinus: No maxillary sinus tenderness or frontal sinus tenderness.     Mouth/Throat:     Mouth: Mucous membranes are not pale and not dry.     Pharynx: Uvula midline.  Eyes:     General: Lids are normal. No allergic shiner.       Right eye: No discharge.        Left eye: No discharge.     Conjunctiva/sclera: Conjunctivae normal.     Right eye: Right conjunctiva is not injected. No chemosis.    Left eye: Left conjunctiva is not  injected. No chemosis.    Pupils: Pupils are equal, round, and reactive to light.  Cardiovascular:     Rate and Rhythm: Normal rate and regular rhythm.     Heart sounds: Normal heart sounds.  Pulmonary:     Effort: Pulmonary effort is normal. No tachypnea, accessory muscle usage or respiratory distress.     Breath sounds: Normal breath sounds. No wheezing, rhonchi or rales.     Comments: Moving air well in all lung fields.  No increased work of breathing. Chest:     Chest wall: No tenderness.  Lymphadenopathy:     Cervical: No cervical adenopathy.  Skin:    Coloration: Skin is not pale.     Findings: No abrasion, erythema, petechiae or rash. Rash is not papular, urticarial or vesicular.  Neurological:     Mental Status: He is alert.  Psychiatric:        Behavior: Behavior is cooperative.     Diagnostic studies: none       [name], MD  [facility]

## 2021-01-07 ENCOUNTER — Encounter: Payer: Self-pay | Admitting: Allergy & Immunology

## 2021-01-13 ENCOUNTER — Ambulatory Visit (INDEPENDENT_AMBULATORY_CARE_PROVIDER_SITE_OTHER): Payer: Medicare HMO | Admitting: *Deleted

## 2021-01-13 DIAGNOSIS — J309 Allergic rhinitis, unspecified: Secondary | ICD-10-CM

## 2021-01-20 ENCOUNTER — Ambulatory Visit (INDEPENDENT_AMBULATORY_CARE_PROVIDER_SITE_OTHER): Payer: Medicare HMO

## 2021-01-20 DIAGNOSIS — J309 Allergic rhinitis, unspecified: Secondary | ICD-10-CM

## 2021-01-27 ENCOUNTER — Ambulatory Visit (INDEPENDENT_AMBULATORY_CARE_PROVIDER_SITE_OTHER): Payer: Medicare HMO

## 2021-01-27 DIAGNOSIS — J309 Allergic rhinitis, unspecified: Secondary | ICD-10-CM | POA: Diagnosis not present

## 2021-01-28 ENCOUNTER — Other Ambulatory Visit: Payer: Self-pay | Admitting: Allergy & Immunology

## 2021-02-03 ENCOUNTER — Ambulatory Visit (INDEPENDENT_AMBULATORY_CARE_PROVIDER_SITE_OTHER): Payer: Medicare HMO | Admitting: *Deleted

## 2021-02-03 DIAGNOSIS — J309 Allergic rhinitis, unspecified: Secondary | ICD-10-CM | POA: Diagnosis not present

## 2021-02-05 DIAGNOSIS — E7800 Pure hypercholesterolemia, unspecified: Secondary | ICD-10-CM | POA: Diagnosis not present

## 2021-02-05 DIAGNOSIS — E7849 Other hyperlipidemia: Secondary | ICD-10-CM | POA: Diagnosis not present

## 2021-02-05 DIAGNOSIS — R7989 Other specified abnormal findings of blood chemistry: Secondary | ICD-10-CM | POA: Diagnosis not present

## 2021-02-05 DIAGNOSIS — K2100 Gastro-esophageal reflux disease with esophagitis, without bleeding: Secondary | ICD-10-CM | POA: Diagnosis not present

## 2021-02-05 DIAGNOSIS — E782 Mixed hyperlipidemia: Secondary | ICD-10-CM | POA: Diagnosis not present

## 2021-02-05 DIAGNOSIS — E1159 Type 2 diabetes mellitus with other circulatory complications: Secondary | ICD-10-CM | POA: Diagnosis not present

## 2021-02-05 DIAGNOSIS — E7801 Familial hypercholesterolemia: Secondary | ICD-10-CM | POA: Diagnosis not present

## 2021-02-05 DIAGNOSIS — I10 Essential (primary) hypertension: Secondary | ICD-10-CM | POA: Diagnosis not present

## 2021-02-07 ENCOUNTER — Other Ambulatory Visit: Payer: Self-pay | Admitting: Allergy & Immunology

## 2021-02-08 ENCOUNTER — Other Ambulatory Visit: Payer: Self-pay | Admitting: Allergy & Immunology

## 2021-02-09 DIAGNOSIS — N521 Erectile dysfunction due to diseases classified elsewhere: Secondary | ICD-10-CM | POA: Diagnosis not present

## 2021-02-09 DIAGNOSIS — E291 Testicular hypofunction: Secondary | ICD-10-CM | POA: Diagnosis not present

## 2021-02-09 DIAGNOSIS — Z23 Encounter for immunization: Secondary | ICD-10-CM | POA: Diagnosis not present

## 2021-02-09 DIAGNOSIS — I10 Essential (primary) hypertension: Secondary | ICD-10-CM | POA: Diagnosis not present

## 2021-02-09 DIAGNOSIS — E7849 Other hyperlipidemia: Secondary | ICD-10-CM | POA: Diagnosis not present

## 2021-02-09 DIAGNOSIS — E1159 Type 2 diabetes mellitus with other circulatory complications: Secondary | ICD-10-CM | POA: Diagnosis not present

## 2021-02-09 DIAGNOSIS — R69 Illness, unspecified: Secondary | ICD-10-CM | POA: Diagnosis not present

## 2021-02-09 DIAGNOSIS — R945 Abnormal results of liver function studies: Secondary | ICD-10-CM | POA: Diagnosis not present

## 2021-02-10 ENCOUNTER — Other Ambulatory Visit: Payer: Self-pay | Admitting: Allergy & Immunology

## 2021-02-17 ENCOUNTER — Ambulatory Visit (INDEPENDENT_AMBULATORY_CARE_PROVIDER_SITE_OTHER): Payer: Medicare HMO | Admitting: *Deleted

## 2021-02-17 DIAGNOSIS — J309 Allergic rhinitis, unspecified: Secondary | ICD-10-CM | POA: Diagnosis not present

## 2021-02-18 DIAGNOSIS — J3081 Allergic rhinitis due to animal (cat) (dog) hair and dander: Secondary | ICD-10-CM | POA: Diagnosis not present

## 2021-02-18 NOTE — Progress Notes (Signed)
VIALS MADE. EXP 02-18-22

## 2021-02-19 DIAGNOSIS — J3089 Other allergic rhinitis: Secondary | ICD-10-CM | POA: Diagnosis not present

## 2021-03-03 ENCOUNTER — Ambulatory Visit (INDEPENDENT_AMBULATORY_CARE_PROVIDER_SITE_OTHER): Payer: Medicare HMO

## 2021-03-03 DIAGNOSIS — J309 Allergic rhinitis, unspecified: Secondary | ICD-10-CM

## 2021-03-19 ENCOUNTER — Ambulatory Visit (INDEPENDENT_AMBULATORY_CARE_PROVIDER_SITE_OTHER): Payer: Medicare HMO

## 2021-03-19 DIAGNOSIS — J309 Allergic rhinitis, unspecified: Secondary | ICD-10-CM | POA: Diagnosis not present

## 2021-05-10 ENCOUNTER — Other Ambulatory Visit: Payer: Self-pay | Admitting: Allergy & Immunology

## 2021-06-10 DIAGNOSIS — E119 Type 2 diabetes mellitus without complications: Secondary | ICD-10-CM | POA: Diagnosis not present

## 2021-06-10 DIAGNOSIS — K2100 Gastro-esophageal reflux disease with esophagitis, without bleeding: Secondary | ICD-10-CM | POA: Diagnosis not present

## 2021-06-10 DIAGNOSIS — E7849 Other hyperlipidemia: Secondary | ICD-10-CM | POA: Diagnosis not present

## 2021-06-10 DIAGNOSIS — E782 Mixed hyperlipidemia: Secondary | ICD-10-CM | POA: Diagnosis not present

## 2021-06-10 DIAGNOSIS — I10 Essential (primary) hypertension: Secondary | ICD-10-CM | POA: Diagnosis not present

## 2021-06-14 DIAGNOSIS — I10 Essential (primary) hypertension: Secondary | ICD-10-CM | POA: Diagnosis not present

## 2021-06-14 DIAGNOSIS — N521 Erectile dysfunction due to diseases classified elsewhere: Secondary | ICD-10-CM | POA: Diagnosis not present

## 2021-06-14 DIAGNOSIS — E1159 Type 2 diabetes mellitus with other circulatory complications: Secondary | ICD-10-CM | POA: Diagnosis not present

## 2021-06-14 DIAGNOSIS — Z1331 Encounter for screening for depression: Secondary | ICD-10-CM | POA: Diagnosis not present

## 2021-06-14 DIAGNOSIS — R945 Abnormal results of liver function studies: Secondary | ICD-10-CM | POA: Diagnosis not present

## 2021-06-14 DIAGNOSIS — Z1389 Encounter for screening for other disorder: Secondary | ICD-10-CM | POA: Diagnosis not present

## 2021-06-14 DIAGNOSIS — F5221 Male erectile disorder: Secondary | ICD-10-CM | POA: Diagnosis not present

## 2021-06-14 DIAGNOSIS — E291 Testicular hypofunction: Secondary | ICD-10-CM | POA: Diagnosis not present

## 2021-06-15 ENCOUNTER — Other Ambulatory Visit: Payer: Self-pay | Admitting: Family Medicine

## 2021-06-15 ENCOUNTER — Other Ambulatory Visit (HOSPITAL_COMMUNITY): Payer: Self-pay | Admitting: Family Medicine

## 2021-06-15 DIAGNOSIS — R945 Abnormal results of liver function studies: Secondary | ICD-10-CM

## 2021-06-24 ENCOUNTER — Ambulatory Visit (HOSPITAL_COMMUNITY)
Admission: RE | Admit: 2021-06-24 | Discharge: 2021-06-24 | Disposition: A | Discharge status: Home / Self Care | Payer: No Typology Code available for payment source | Source: Ambulatory Visit | Attending: Family Medicine | Admitting: Family Medicine

## 2021-06-24 DIAGNOSIS — R945 Abnormal results of liver function studies: Secondary | ICD-10-CM

## 2021-06-24 DIAGNOSIS — K7689 Other specified diseases of liver: Secondary | ICD-10-CM | POA: Diagnosis not present

## 2021-06-24 DIAGNOSIS — R7989 Other specified abnormal findings of blood chemistry: Secondary | ICD-10-CM | POA: Diagnosis not present

## 2021-07-12 DIAGNOSIS — F3341 Major depressive disorder, recurrent, in partial remission: Secondary | ICD-10-CM | POA: Diagnosis not present

## 2021-07-12 DIAGNOSIS — K760 Fatty (change of) liver, not elsewhere classified: Secondary | ICD-10-CM | POA: Diagnosis not present

## 2021-07-12 DIAGNOSIS — Z6835 Body mass index (BMI) 35.0-35.9, adult: Secondary | ICD-10-CM | POA: Diagnosis not present

## 2021-07-12 DIAGNOSIS — F10929 Alcohol use, unspecified with intoxication, unspecified: Secondary | ICD-10-CM | POA: Diagnosis not present

## 2021-07-12 DIAGNOSIS — F419 Anxiety disorder, unspecified: Secondary | ICD-10-CM | POA: Diagnosis not present

## 2021-07-30 ENCOUNTER — Other Ambulatory Visit: Payer: Self-pay | Admitting: Allergy & Immunology

## 2021-08-02 DIAGNOSIS — E7800 Pure hypercholesterolemia, unspecified: Secondary | ICD-10-CM | POA: Diagnosis not present

## 2021-08-02 DIAGNOSIS — H5200 Hypermetropia, unspecified eye: Secondary | ICD-10-CM | POA: Diagnosis not present

## 2021-08-02 DIAGNOSIS — E109 Type 1 diabetes mellitus without complications: Secondary | ICD-10-CM | POA: Diagnosis not present

## 2021-08-04 ENCOUNTER — Other Ambulatory Visit: Payer: Self-pay | Admitting: Allergy & Immunology

## 2021-08-16 DIAGNOSIS — E119 Type 2 diabetes mellitus without complications: Secondary | ICD-10-CM | POA: Diagnosis not present

## 2021-09-02 DIAGNOSIS — F419 Anxiety disorder, unspecified: Secondary | ICD-10-CM | POA: Diagnosis not present

## 2021-09-02 DIAGNOSIS — R030 Elevated blood-pressure reading, without diagnosis of hypertension: Secondary | ICD-10-CM | POA: Diagnosis not present

## 2021-09-02 DIAGNOSIS — Z6836 Body mass index (BMI) 36.0-36.9, adult: Secondary | ICD-10-CM | POA: Diagnosis not present

## 2021-09-20 DIAGNOSIS — F324 Major depressive disorder, single episode, in partial remission: Secondary | ICD-10-CM | POA: Diagnosis not present

## 2021-09-20 DIAGNOSIS — F419 Anxiety disorder, unspecified: Secondary | ICD-10-CM | POA: Diagnosis not present

## 2021-09-20 DIAGNOSIS — Z6836 Body mass index (BMI) 36.0-36.9, adult: Secondary | ICD-10-CM | POA: Diagnosis not present

## 2021-09-20 DIAGNOSIS — Z1389 Encounter for screening for other disorder: Secondary | ICD-10-CM | POA: Diagnosis not present

## 2021-09-20 DIAGNOSIS — Z1331 Encounter for screening for depression: Secondary | ICD-10-CM | POA: Diagnosis not present

## 2021-10-11 DIAGNOSIS — R7989 Other specified abnormal findings of blood chemistry: Secondary | ICD-10-CM | POA: Diagnosis not present

## 2021-10-11 DIAGNOSIS — E119 Type 2 diabetes mellitus without complications: Secondary | ICD-10-CM | POA: Diagnosis not present

## 2021-10-11 DIAGNOSIS — K2100 Gastro-esophageal reflux disease with esophagitis, without bleeding: Secondary | ICD-10-CM | POA: Diagnosis not present

## 2021-10-11 DIAGNOSIS — K760 Fatty (change of) liver, not elsewhere classified: Secondary | ICD-10-CM | POA: Diagnosis not present

## 2021-10-11 DIAGNOSIS — E7849 Other hyperlipidemia: Secondary | ICD-10-CM | POA: Diagnosis not present

## 2021-10-11 DIAGNOSIS — E782 Mixed hyperlipidemia: Secondary | ICD-10-CM | POA: Diagnosis not present

## 2021-10-11 DIAGNOSIS — I10 Essential (primary) hypertension: Secondary | ICD-10-CM | POA: Diagnosis not present

## 2021-10-13 DIAGNOSIS — M25561 Pain in right knee: Secondary | ICD-10-CM | POA: Diagnosis not present

## 2021-10-13 DIAGNOSIS — F5221 Male erectile disorder: Secondary | ICD-10-CM | POA: Diagnosis not present

## 2021-10-13 DIAGNOSIS — E1159 Type 2 diabetes mellitus with other circulatory complications: Secondary | ICD-10-CM | POA: Diagnosis not present

## 2021-10-13 DIAGNOSIS — I10 Essential (primary) hypertension: Secondary | ICD-10-CM | POA: Diagnosis not present

## 2021-10-13 DIAGNOSIS — Z6836 Body mass index (BMI) 36.0-36.9, adult: Secondary | ICD-10-CM | POA: Diagnosis not present

## 2021-10-13 DIAGNOSIS — E7849 Other hyperlipidemia: Secondary | ICD-10-CM | POA: Diagnosis not present

## 2021-10-13 DIAGNOSIS — N521 Erectile dysfunction due to diseases classified elsewhere: Secondary | ICD-10-CM | POA: Diagnosis not present

## 2021-10-13 DIAGNOSIS — R945 Abnormal results of liver function studies: Secondary | ICD-10-CM | POA: Diagnosis not present

## 2021-10-13 DIAGNOSIS — E291 Testicular hypofunction: Secondary | ICD-10-CM | POA: Diagnosis not present

## 2021-10-13 DIAGNOSIS — F3341 Major depressive disorder, recurrent, in partial remission: Secondary | ICD-10-CM | POA: Diagnosis not present

## 2021-10-13 DIAGNOSIS — F411 Generalized anxiety disorder: Secondary | ICD-10-CM | POA: Diagnosis not present

## 2021-10-22 ENCOUNTER — Other Ambulatory Visit: Payer: Self-pay | Admitting: Allergy & Immunology

## 2021-10-27 ENCOUNTER — Other Ambulatory Visit: Payer: Self-pay | Admitting: Allergy & Immunology

## 2021-11-03 ENCOUNTER — Other Ambulatory Visit: Payer: Self-pay | Admitting: Allergy & Immunology

## 2022-01-05 ENCOUNTER — Ambulatory Visit: Payer: Medicare HMO | Admitting: Allergy & Immunology

## 2022-01-20 DIAGNOSIS — Z6834 Body mass index (BMI) 34.0-34.9, adult: Secondary | ICD-10-CM | POA: Diagnosis not present

## 2022-01-20 DIAGNOSIS — F411 Generalized anxiety disorder: Secondary | ICD-10-CM | POA: Diagnosis not present

## 2022-01-20 DIAGNOSIS — R030 Elevated blood-pressure reading, without diagnosis of hypertension: Secondary | ICD-10-CM | POA: Diagnosis not present

## 2022-01-21 ENCOUNTER — Other Ambulatory Visit: Payer: Self-pay | Admitting: Allergy & Immunology

## 2022-01-26 ENCOUNTER — Ambulatory Visit (INDEPENDENT_AMBULATORY_CARE_PROVIDER_SITE_OTHER): Payer: No Typology Code available for payment source | Admitting: Family Medicine

## 2022-01-26 ENCOUNTER — Encounter: Payer: Self-pay | Admitting: Family Medicine

## 2022-01-26 ENCOUNTER — Other Ambulatory Visit: Payer: Self-pay

## 2022-01-26 VITALS — BP 130/90 | HR 102 | Temp 97.2°F | Resp 18 | Ht 64.0 in | Wt 183.8 lb

## 2022-01-26 DIAGNOSIS — K219 Gastro-esophageal reflux disease without esophagitis: Secondary | ICD-10-CM

## 2022-01-26 DIAGNOSIS — J3089 Other allergic rhinitis: Secondary | ICD-10-CM

## 2022-01-26 DIAGNOSIS — J302 Other seasonal allergic rhinitis: Secondary | ICD-10-CM

## 2022-01-26 MED ORDER — EPINEPHRINE 0.3 MG/0.3ML IJ SOAJ: INTRAMUSCULAR | 1 refills | Status: DC

## 2022-01-26 MED ORDER — MOMETASONE FUROATE 50 MCG/ACT NA SUSP: 2.0000 | Freq: Every day | NASAL | 1 refills | Status: DC

## 2022-01-26 MED ORDER — AZELASTINE HCL 137 MCG/SPRAY NA SOLN: 2.0000 | Freq: Two times a day (BID) | NASAL | 2 refills | Status: DC | PRN

## 2022-01-26 MED ORDER — IPRATROPIUM BROMIDE 0.06 % NA SOLN: 2.0000 | Freq: Two times a day (BID) | NASAL | 1 refills | Status: DC | PRN

## 2022-01-26 MED ORDER — LEVOCETIRIZINE DIHYDROCHLORIDE 5 MG PO TABS: 5.0000 mg | ORAL_TABLET | Freq: Every day | ORAL | 1 refills | Status: DC | PRN

## 2022-01-26 NOTE — Progress Notes (Signed)
[place], [address] [place] [id number] Dept: [phone]  FOLLOW UP NOTE  Patient ID: [name] Harper, male    DOB: 1960-07-02  Age: 61 y.o. MRN: [id number] Date of Office Visit: 01/26/2022  Assessment  Chief Complaint: Gastroesophageal Reflux (6 mth f/u - Fine) and Seasonal and Perennial Allergic Rhinits (6 mth f/u - Fine)  HPI [name] Harper is a 61 year old male who presents to the clinic for a follow up visit. He was last seen in this clinic on 01/06/2021 by Dr. Ernst Bowler for evaluation of seasonal and perennial allergic rhinitis on allergen immunotherapy, reflux, cough, and oral lesions. His last allergy injection was on 03/19/2021. He reports that [place] had to go out of the [place] for several weeks following his last injection. At today's visit, he reports his symptoms of allergic rhinitis have been moderately well controlled with occasional clear rhinorrhea as the main symptom. He continues levocetirizine 5 mg once a day, mometasone nasal spray daily, ipratropium nasal spray daily, and azelastine daily with relief of symptoms. He is interested in restarting allergen immunotherapy. Reflux is reported as well controlled with no symptoms including heartburn or vomiting with no medical intervention. He denies oral lesions since his last visit to this clinic. His current medications are listed in the chart.   Drug Allergies:  No Known Allergies  Physical Exam: BP (!) 130/90 Comment: Patient states he's been sick and depressed.  Pulse (!) 102   Temp (!) 97.2 F (36.2 C)   Resp 18   Ht '5\' 4"'$  (1.626 m)   Wt 183 lb 12.8 oz (83.4 kg)   SpO2 94%   BMI 31.55 kg/m    Physical Exam Vitals reviewed.  Constitutional:      Appearance: Normal appearance.  HENT:     Head: Normocephalic and atraumatic.     Right Ear: Tympanic membrane normal.     Left Ear: Tympanic membrane normal.     Nose:     Comments: Bilateral nares slightly erythematous with clear nasal drainage noted.  Pharynx normal. Ears normal. Eyes normal. Eyes:     Conjunctiva/sclera: Conjunctivae normal.  Cardiovascular:     Rate and Rhythm: Normal rate and regular rhythm.     Heart sounds: Normal heart sounds. No murmur heard. Pulmonary:     Effort: Pulmonary effort is normal.     Breath sounds: Normal breath sounds.     Comments: Lungs clear to auscultation Musculoskeletal:        General: Normal range of motion.     Cervical back: Normal range of motion and neck supple.  Skin:    General: Skin is warm and dry.  Neurological:     Mental Status: He is alert and oriented to person, place, and time.  Psychiatric:        Mood and Affect: Mood normal.        Behavior: Behavior normal.        Thought Content: Thought content normal.        Judgment: Judgment normal.     Assessment and Plan: 1. Seasonal and perennial allergic rhinitis   2. Gastroesophageal reflux disease, unspecified whether esophagitis present     Meds ordered this encounter  Medications   Azelastine HCl 137 MCG/SPRAY SOLN    Sig: Place 2 sprays into both nostrils 2 (two) times daily as needed.    Dispense:  30 mL    Refill:  2    PT REQUESTED   EPINEPHrine 0.3 mg/0.3  mL IJ SOAJ injection    Sig: USE AS DIRECTED FOR A SEVERE ALLERGIC REACTION.    Dispense:  2 each    Refill:  1   ipratropium (ATROVENT) 0.06 % nasal spray    Sig: Place 2 sprays into both nostrils 2 (two) times daily as needed for rhinitis.    Dispense:  45 mL    Refill:  1   levocetirizine (XYZAL) 5 MG tablet    Sig: Take 1 tablet (5 mg total) by mouth daily as needed for allergies.    Dispense:  90 tablet    Refill:  1   mometasone (NASONEX) 50 MCG/ACT nasal spray    Sig: Place 2 sprays into the nose daily.    Dispense:  51 each    Refill:  1    Patient Instructions  Allergic rhinitis Continue allergen avoidance measures directed toward pollen, pets, mold, dust mite, and cockroach as listed below Restart allergen immunotherapy and have  access to an epinephrine autoinjector set Continue Xyzal 5 mg once a day as needed for runny nose Continue Flonase 2 sprays in each nostril once a day as needed for stuffy nose Consider saline nasal rinses as needed for nasal symptoms. Use this before any medicated nasal sprays for best result  Reflux Continue dietary and lifestyle modifications as listed below  Call the clinic if this treatment plan is not working well for you.  Follow up in 6 months or sooner if needed.   No follow-ups on file.    Thank you for the opportunity to care for this patient.  Please do not hesitate to contact me with questions.  Gareth Morgan, FNP [facility]

## 2022-01-26 NOTE — Patient Instructions (Signed)
Allergic rhinitis Continue allergen avoidance measures directed toward pollen, pets, mold, dust mite, and cockroach as listed below Restart allergen immunotherapy and have access to an epinephrine autoinjector set Continue Xyzal 5 mg once a day as needed for runny nose Continue Flonase 2 sprays in each nostril once a day as needed for stuffy nose Consider saline nasal rinses as needed for nasal symptoms. Use this before any medicated nasal sprays for best result  Reflux Continue dietary and lifestyle modifications as listed below  Call the clinic if this treatment plan is not working well for you.  Follow up in 6 months or sooner if needed.  Reducing Pollen Exposure The American Academy of Allergy, Asthma and Immunology suggests the following steps to reduce your exposure to pollen during allergy seasons. Do not hang sheets or clothing out to dry; pollen may collect on these items. Do not mow lawns or spend time around freshly cut grass; mowing stirs up pollen. Keep windows closed at night.  Keep car windows closed while driving. Minimize morning activities outdoors, a time when pollen counts are usually at their highest. Stay indoors as much as possible when pollen counts or humidity is high and on windy days when pollen tends to remain in the air longer. Use air conditioning when possible.  Many air conditioners have filters that trap the pollen spores. Use a HEPA room air filter to remove pollen form the indoor air you breathe.  Control of Mold Allergen Mold and fungi can grow on a variety of surfaces provided certain temperature and moisture conditions exist.  Outdoor molds grow on plants, decaying vegetation and soil.  The major outdoor mold, Alternaria and Cladosporium, are found in very high numbers during hot and dry conditions.  Generally, a late Summer - Fall peak is seen for common outdoor fungal spores.  Rain will temporarily lower outdoor mold spore count, but counts rise  rapidly when the rainy period ends.  The most important indoor molds are Aspergillus and Penicillium.  Dark, humid and poorly ventilated basements are ideal sites for mold growth.  The next most common sites of mold growth are the bathroom and the kitchen.  Outdoor [employer] Use air conditioning and keep windows closed Avoid exposure to decaying vegetation. Avoid leaf raking. Avoid grain handling. Consider wearing a face mask if working in moldy areas.  Indoor Mold Control Maintain humidity below 50%. Clean washable surfaces with 5% bleach solution. Remove sources e.g. Contaminated carpets.   Control of Dust Mite Allergen Dust mites play a major role in allergic asthma and rhinitis. They occur in environments with high humidity wherever human skin is found. Dust mites absorb humidity from the atmosphere (ie, they do not drink) and feed on organic matter (including shed human and animal skin). Dust mites are a microscopic type of insect that you cannot see with the naked eye. High levels of dust mites have been detected from mattresses, pillows, carpets, upholstered furniture, bed covers, clothes, soft toys and any woven material. The principal allergen of the dust mite is found in its feces. A gram of dust may contain 1,000 mites and 250,000 fecal particles. Mite antigen is easily measured in the air during house cleaning activities. Dust mites do not bite and do not cause harm to humans, other than by triggering allergies/asthma.  Ways to decrease your exposure to dust mites in your home:  1. Encase mattresses, box springs and pillows with a mite-impermeable barrier or cover  2. Wash sheets, blankets and drapes  weekly in hot water (130 F) with detergent and dry them in a dryer on the hot setting.  3. Have the room cleaned frequently with a vacuum cleaner and a damp dust-mop. For carpeting or rugs, vacuuming with a vacuum cleaner equipped with a high-efficiency particulate air (HEPA)  filter. The dust mite allergic individual should not be in a room which is being cleaned and should wait 1 hour after cleaning before going into the room.  4. Do not sleep on upholstered furniture (eg, couches).  5. If possible removing carpeting, upholstered furniture and drapery from the home is ideal. Horizontal blinds should be eliminated in the rooms where the person spends the most time (bedroom, study, television room). Washable vinyl, roller-type shades are optimal.  6. Remove all non-washable stuffed toys from the bedroom. Wash stuffed toys weekly like sheets and blankets above.  7. Reduce indoor humidity to less than 50%. Inexpensive humidity monitors can be purchased at most hardware stores. Do not use a humidifier as can make the problem worse and are not recommended.  Control of Dog or Cat Allergen Avoidance is the best way to manage a dog or cat allergy. If you have a dog or cat and are allergic to dog or cats, consider removing the dog or cat from the home. If you have a dog or cat but don't want to find it a new home, or if your family wants a pet even though someone in the household is allergic, here are some strategies that may help keep symptoms at bay:  Keep the pet out of your bedroom and restrict it to only a few rooms. Be advised that keeping the dog or cat in only one room will not limit the allergens to that room. Don't pet, hug or kiss the dog or cat; if you do, wash your hands with soap and water. High-efficiency particulate air (HEPA) cleaners run continuously in a bedroom or living room can reduce allergen levels over time. Regular use of a high-efficiency vacuum cleaner or a central vacuum can reduce allergen levels. Giving your dog or cat a bath at least once a week can reduce airborne allergen.  Control of Cockroach Allergen Cockroach allergen has been identified as an important cause of acute attacks of asthma, especially in urban settings.  There are fifty-five  species of cockroach that exist in the [place], however only three, the [place], [place] species produce allergen that can affect patients with Asthma.  Allergens can be obtained from fecal particles, egg casings and secretions from cockroaches.    Remove food sources. Reduce access to water. Seal access and entry points. Spray runways with 0.5-1% Diazinon or Chlorpyrifos Blow boric acid power under stoves and refrigerator. Place bait stations (hydramethylnon) at feeding sites.    Lifestyle Changes for Controlling GERD When you have GERD, stomach acid feels as if it's backing up toward your mouth. Whether or not you take medication to control your GERD, your symptoms can often be improved with lifestyle changes.   Raise Your Head Reflux is more likely to strike when you're lying down flat, because stomach fluid can flow backward more easily. Raising the head of your bed 4-6 inches can help. To do this: Slide blocks or books under the legs at the head of your bed. Or, place a wedge under the mattress. Many foam stores can make a suitable wedge for you. The wedge should run from your waist to the top of your head. Don't just prop  your head on several pillows. This increases pressure on your stomach. It can make GERD worse.  Watch Your Eating Habits Certain foods may increase the acid in your stomach or relax the lower esophageal sphincter, making GERD more likely. It's best to avoid the following: Coffee, tea, and carbonated drinks (with and without caffeine) Fatty, fried, or spicy food Mint, chocolate, onions, and tomatoes Any other foods that seem to irritate your stomach or cause you pain  Relieve the Pressure Eat smaller meals, even if you have to eat more often. Don't lie down right after you eat. Wait a few hours for your stomach to empty. Avoid tight belts and tight-fitting clothes. Lose excess weight.  Tobacco and Alcohol Avoid smoking tobacco and  drinking alcohol. They can make GERD symptoms worse.

## 2022-02-02 ENCOUNTER — Other Ambulatory Visit: Payer: Self-pay | Admitting: Family Medicine

## 2022-02-10 DIAGNOSIS — R7989 Other specified abnormal findings of blood chemistry: Secondary | ICD-10-CM | POA: Diagnosis not present

## 2022-02-10 DIAGNOSIS — E7801 Familial hypercholesterolemia: Secondary | ICD-10-CM | POA: Diagnosis not present

## 2022-02-10 DIAGNOSIS — I10 Essential (primary) hypertension: Secondary | ICD-10-CM | POA: Diagnosis not present

## 2022-02-10 DIAGNOSIS — E119 Type 2 diabetes mellitus without complications: Secondary | ICD-10-CM | POA: Diagnosis not present

## 2022-02-10 DIAGNOSIS — E7849 Other hyperlipidemia: Secondary | ICD-10-CM | POA: Diagnosis not present

## 2022-02-15 DIAGNOSIS — E7849 Other hyperlipidemia: Secondary | ICD-10-CM | POA: Diagnosis not present

## 2022-02-15 DIAGNOSIS — F3341 Major depressive disorder, recurrent, in partial remission: Secondary | ICD-10-CM | POA: Diagnosis not present

## 2022-02-15 DIAGNOSIS — R945 Abnormal results of liver function studies: Secondary | ICD-10-CM | POA: Diagnosis not present

## 2022-02-15 DIAGNOSIS — F411 Generalized anxiety disorder: Secondary | ICD-10-CM | POA: Diagnosis not present

## 2022-02-15 DIAGNOSIS — Z6833 Body mass index (BMI) 33.0-33.9, adult: Secondary | ICD-10-CM | POA: Diagnosis not present

## 2022-02-15 DIAGNOSIS — I10 Essential (primary) hypertension: Secondary | ICD-10-CM | POA: Diagnosis not present

## 2022-02-15 DIAGNOSIS — F5221 Male erectile disorder: Secondary | ICD-10-CM | POA: Diagnosis not present

## 2022-02-15 DIAGNOSIS — E291 Testicular hypofunction: Secondary | ICD-10-CM | POA: Diagnosis not present

## 2022-02-15 DIAGNOSIS — N521 Erectile dysfunction due to diseases classified elsewhere: Secondary | ICD-10-CM | POA: Diagnosis not present

## 2022-02-15 DIAGNOSIS — M25561 Pain in right knee: Secondary | ICD-10-CM | POA: Diagnosis not present

## 2022-02-15 DIAGNOSIS — E1159 Type 2 diabetes mellitus with other circulatory complications: Secondary | ICD-10-CM | POA: Diagnosis not present

## 2022-02-15 DIAGNOSIS — E6609 Other obesity due to excess calories: Secondary | ICD-10-CM | POA: Diagnosis not present

## 2022-02-16 ENCOUNTER — Ambulatory Visit: Payer: No Typology Code available for payment source

## 2022-02-16 ENCOUNTER — Telehealth: Payer: Self-pay

## 2022-02-16 DIAGNOSIS — J3081 Allergic rhinitis due to animal (cat) (dog) hair and dander: Secondary | ICD-10-CM | POA: Diagnosis not present

## 2022-02-16 NOTE — Progress Notes (Signed)
VIALS EXP 02-17-23 

## 2022-02-16 NOTE — Telephone Encounter (Signed)
Patient came in today to restart allergy injections. When patient arrived his vials were not in the refrigerator. I looked in his chart and saw in the appointment notes where Marcie Bal signed her initials and wrote make. I called [facility] to see if his vials were there and was told they were not. I then reached out to [place] and she informed me that the vials were not made and that the last vias that were made were in 2022. I gave patient a [employer] Gift card and rescheduled him for two weeks out. Patient verbalized understanding and was apologized to.

## 2022-02-17 DIAGNOSIS — J3089 Other allergic rhinitis: Secondary | ICD-10-CM | POA: Diagnosis not present

## 2022-02-17 NOTE — Telephone Encounter (Signed)
Noted, thank you

## 2022-03-02 ENCOUNTER — Ambulatory Visit: Payer: No Typology Code available for payment source

## 2022-03-07 ENCOUNTER — Ambulatory Visit: Payer: No Typology Code available for payment source

## 2022-03-11 ENCOUNTER — Ambulatory Visit (INDEPENDENT_AMBULATORY_CARE_PROVIDER_SITE_OTHER): Payer: No Typology Code available for payment source

## 2022-03-11 DIAGNOSIS — J309 Allergic rhinitis, unspecified: Secondary | ICD-10-CM

## 2022-03-11 MED ORDER — EPINEPHRINE 0.3 MG/0.3ML IJ SOAJ: 0.3000 mg | INTRAMUSCULAR | 1 refills | Status: AC | PRN

## 2022-03-11 NOTE — Progress Notes (Signed)
Immunotherapy   Patient Details  Name: [name] [place] MRN: [id number] Date of Birth: January 07, 1961  03/11/2022  [name] started injections for  grasses, trees, cats, dogs, ragweed's molds, cockroaches, and dust mites for the second time.  Following schedule: B  Frequency:2 times per week Epi-Pen:Prescription for Epi-Pen given Consent signed again in office and patient instructions given. Patient waited for thirty minutes in the lobby without an issue.    [name] 03/11/2022, 12:38 PM

## 2022-03-14 ENCOUNTER — Ambulatory Visit: Payer: No Typology Code available for payment source

## 2022-03-16 ENCOUNTER — Ambulatory Visit (INDEPENDENT_AMBULATORY_CARE_PROVIDER_SITE_OTHER): Payer: No Typology Code available for payment source

## 2022-03-16 DIAGNOSIS — J309 Allergic rhinitis, unspecified: Secondary | ICD-10-CM

## 2022-03-18 ENCOUNTER — Ambulatory Visit (INDEPENDENT_AMBULATORY_CARE_PROVIDER_SITE_OTHER): Payer: No Typology Code available for payment source

## 2022-03-18 DIAGNOSIS — J309 Allergic rhinitis, unspecified: Secondary | ICD-10-CM

## 2022-03-23 ENCOUNTER — Ambulatory Visit (INDEPENDENT_AMBULATORY_CARE_PROVIDER_SITE_OTHER): Payer: No Typology Code available for payment source

## 2022-03-23 DIAGNOSIS — J309 Allergic rhinitis, unspecified: Secondary | ICD-10-CM | POA: Diagnosis not present

## 2022-03-25 ENCOUNTER — Ambulatory Visit (INDEPENDENT_AMBULATORY_CARE_PROVIDER_SITE_OTHER): Payer: No Typology Code available for payment source

## 2022-03-25 DIAGNOSIS — J309 Allergic rhinitis, unspecified: Secondary | ICD-10-CM

## 2022-03-30 ENCOUNTER — Ambulatory Visit (INDEPENDENT_AMBULATORY_CARE_PROVIDER_SITE_OTHER): Payer: No Typology Code available for payment source

## 2022-03-30 DIAGNOSIS — J309 Allergic rhinitis, unspecified: Secondary | ICD-10-CM | POA: Diagnosis not present

## 2022-04-01 ENCOUNTER — Ambulatory Visit (INDEPENDENT_AMBULATORY_CARE_PROVIDER_SITE_OTHER): Payer: No Typology Code available for payment source

## 2022-04-01 DIAGNOSIS — J309 Allergic rhinitis, unspecified: Secondary | ICD-10-CM

## 2022-04-06 ENCOUNTER — Ambulatory Visit (INDEPENDENT_AMBULATORY_CARE_PROVIDER_SITE_OTHER): Payer: No Typology Code available for payment source

## 2022-04-06 DIAGNOSIS — J309 Allergic rhinitis, unspecified: Secondary | ICD-10-CM

## 2022-04-08 ENCOUNTER — Ambulatory Visit (INDEPENDENT_AMBULATORY_CARE_PROVIDER_SITE_OTHER): Payer: No Typology Code available for payment source

## 2022-04-08 DIAGNOSIS — J309 Allergic rhinitis, unspecified: Secondary | ICD-10-CM | POA: Diagnosis not present

## 2022-04-13 ENCOUNTER — Ambulatory Visit (INDEPENDENT_AMBULATORY_CARE_PROVIDER_SITE_OTHER): Payer: No Typology Code available for payment source

## 2022-04-13 DIAGNOSIS — J309 Allergic rhinitis, unspecified: Secondary | ICD-10-CM | POA: Diagnosis not present

## 2022-05-23 ENCOUNTER — Other Ambulatory Visit: Payer: Self-pay

## 2022-05-23 MED ORDER — AZELASTINE HCL 137 MCG/SPRAY NA SOLN: 2.0000 | Freq: Two times a day (BID) | NASAL | 2 refills | Status: DC | PRN

## 2022-05-23 NOTE — Telephone Encounter (Signed)
Received a refill renewal request via fax for Azelastine Nasal Spray. I have sent a refill to the [facility] in [place].

## 2022-05-25 ENCOUNTER — Ambulatory Visit (INDEPENDENT_AMBULATORY_CARE_PROVIDER_SITE_OTHER): Payer: No Typology Code available for payment source

## 2022-05-25 DIAGNOSIS — J309 Allergic rhinitis, unspecified: Secondary | ICD-10-CM | POA: Diagnosis not present

## 2022-05-27 ENCOUNTER — Ambulatory Visit (INDEPENDENT_AMBULATORY_CARE_PROVIDER_SITE_OTHER): Payer: No Typology Code available for payment source

## 2022-05-27 DIAGNOSIS — J309 Allergic rhinitis, unspecified: Secondary | ICD-10-CM

## 2022-06-01 ENCOUNTER — Ambulatory Visit (INDEPENDENT_AMBULATORY_CARE_PROVIDER_SITE_OTHER): Payer: No Typology Code available for payment source

## 2022-06-01 DIAGNOSIS — J309 Allergic rhinitis, unspecified: Secondary | ICD-10-CM

## 2022-06-03 ENCOUNTER — Ambulatory Visit (INDEPENDENT_AMBULATORY_CARE_PROVIDER_SITE_OTHER): Payer: No Typology Code available for payment source

## 2022-06-03 DIAGNOSIS — J309 Allergic rhinitis, unspecified: Secondary | ICD-10-CM | POA: Diagnosis not present

## 2022-06-08 ENCOUNTER — Ambulatory Visit (INDEPENDENT_AMBULATORY_CARE_PROVIDER_SITE_OTHER): Payer: No Typology Code available for payment source

## 2022-06-08 DIAGNOSIS — R7989 Other specified abnormal findings of blood chemistry: Secondary | ICD-10-CM | POA: Diagnosis not present

## 2022-06-08 DIAGNOSIS — K2100 Gastro-esophageal reflux disease with esophagitis, without bleeding: Secondary | ICD-10-CM | POA: Diagnosis not present

## 2022-06-08 DIAGNOSIS — J309 Allergic rhinitis, unspecified: Secondary | ICD-10-CM

## 2022-06-08 DIAGNOSIS — E7801 Familial hypercholesterolemia: Secondary | ICD-10-CM | POA: Diagnosis not present

## 2022-06-08 DIAGNOSIS — E7849 Other hyperlipidemia: Secondary | ICD-10-CM | POA: Diagnosis not present

## 2022-06-08 DIAGNOSIS — E119 Type 2 diabetes mellitus without complications: Secondary | ICD-10-CM | POA: Diagnosis not present

## 2022-06-10 ENCOUNTER — Ambulatory Visit (INDEPENDENT_AMBULATORY_CARE_PROVIDER_SITE_OTHER): Payer: No Typology Code available for payment source

## 2022-06-10 DIAGNOSIS — J309 Allergic rhinitis, unspecified: Secondary | ICD-10-CM | POA: Diagnosis not present

## 2022-06-15 ENCOUNTER — Ambulatory Visit (INDEPENDENT_AMBULATORY_CARE_PROVIDER_SITE_OTHER): Payer: No Typology Code available for payment source

## 2022-06-15 DIAGNOSIS — E291 Testicular hypofunction: Secondary | ICD-10-CM | POA: Diagnosis not present

## 2022-06-15 DIAGNOSIS — I10 Essential (primary) hypertension: Secondary | ICD-10-CM | POA: Diagnosis not present

## 2022-06-15 DIAGNOSIS — F3341 Major depressive disorder, recurrent, in partial remission: Secondary | ICD-10-CM | POA: Diagnosis not present

## 2022-06-15 DIAGNOSIS — J309 Allergic rhinitis, unspecified: Secondary | ICD-10-CM

## 2022-06-15 DIAGNOSIS — Z6833 Body mass index (BMI) 33.0-33.9, adult: Secondary | ICD-10-CM | POA: Diagnosis not present

## 2022-06-15 DIAGNOSIS — M25561 Pain in right knee: Secondary | ICD-10-CM | POA: Diagnosis not present

## 2022-06-15 DIAGNOSIS — E7849 Other hyperlipidemia: Secondary | ICD-10-CM | POA: Diagnosis not present

## 2022-06-15 DIAGNOSIS — F5221 Male erectile disorder: Secondary | ICD-10-CM | POA: Diagnosis not present

## 2022-06-15 DIAGNOSIS — E1139 Type 2 diabetes mellitus with other diabetic ophthalmic complication: Secondary | ICD-10-CM | POA: Diagnosis not present

## 2022-06-15 DIAGNOSIS — E6609 Other obesity due to excess calories: Secondary | ICD-10-CM | POA: Diagnosis not present

## 2022-06-15 DIAGNOSIS — R945 Abnormal results of liver function studies: Secondary | ICD-10-CM | POA: Diagnosis not present

## 2022-06-15 DIAGNOSIS — F411 Generalized anxiety disorder: Secondary | ICD-10-CM | POA: Diagnosis not present

## 2022-06-15 DIAGNOSIS — N521 Erectile dysfunction due to diseases classified elsewhere: Secondary | ICD-10-CM | POA: Diagnosis not present

## 2022-06-17 ENCOUNTER — Ambulatory Visit (INDEPENDENT_AMBULATORY_CARE_PROVIDER_SITE_OTHER): Payer: No Typology Code available for payment source

## 2022-06-17 DIAGNOSIS — J309 Allergic rhinitis, unspecified: Secondary | ICD-10-CM | POA: Diagnosis not present

## 2022-07-06 ENCOUNTER — Ambulatory Visit (INDEPENDENT_AMBULATORY_CARE_PROVIDER_SITE_OTHER): Payer: 59

## 2022-07-06 DIAGNOSIS — J309 Allergic rhinitis, unspecified: Secondary | ICD-10-CM

## 2022-07-13 ENCOUNTER — Ambulatory Visit (INDEPENDENT_AMBULATORY_CARE_PROVIDER_SITE_OTHER): Payer: 59

## 2022-07-13 DIAGNOSIS — J309 Allergic rhinitis, unspecified: Secondary | ICD-10-CM | POA: Diagnosis not present

## 2022-07-15 ENCOUNTER — Ambulatory Visit (INDEPENDENT_AMBULATORY_CARE_PROVIDER_SITE_OTHER): Payer: 59

## 2022-07-15 DIAGNOSIS — J309 Allergic rhinitis, unspecified: Secondary | ICD-10-CM

## 2022-07-19 ENCOUNTER — Other Ambulatory Visit: Payer: Self-pay | Admitting: Family Medicine

## 2022-07-20 ENCOUNTER — Ambulatory Visit (INDEPENDENT_AMBULATORY_CARE_PROVIDER_SITE_OTHER): Payer: 59

## 2022-07-20 DIAGNOSIS — J309 Allergic rhinitis, unspecified: Secondary | ICD-10-CM

## 2022-07-25 ENCOUNTER — Other Ambulatory Visit: Payer: Self-pay | Admitting: Family Medicine

## 2022-08-18 ENCOUNTER — Other Ambulatory Visit: Payer: Self-pay | Admitting: Family Medicine

## 2022-08-19 ENCOUNTER — Other Ambulatory Visit: Payer: Self-pay | Admitting: Family Medicine

## 2022-08-20 ENCOUNTER — Other Ambulatory Visit: Payer: Self-pay | Admitting: Family Medicine

## 2022-08-23 ENCOUNTER — Other Ambulatory Visit: Payer: Self-pay | Admitting: Family Medicine

## 2022-08-31 ENCOUNTER — Ambulatory Visit: Payer: Self-pay

## 2022-08-31 ENCOUNTER — Other Ambulatory Visit: Payer: Self-pay | Admitting: Family Medicine

## 2022-08-31 MED ORDER — MOMETASONE FUROATE 50 MCG/ACT NA SUSP: 2.0000 | Freq: Every day | NASAL | 1 refills | Status: DC

## 2022-08-31 MED ORDER — IPRATROPIUM BROMIDE 0.06 % NA SOLN: 2.0000 | Freq: Two times a day (BID) | NASAL | 0 refills | Status: DC | PRN

## 2022-08-31 NOTE — Telephone Encounter (Signed)
Patient needed a refill on nasal sprays  [name] Harper [phone]

## 2022-09-02 ENCOUNTER — Ambulatory Visit (INDEPENDENT_AMBULATORY_CARE_PROVIDER_SITE_OTHER): Payer: Medicare HMO

## 2022-09-02 DIAGNOSIS — J309 Allergic rhinitis, unspecified: Secondary | ICD-10-CM | POA: Diagnosis not present

## 2022-09-07 ENCOUNTER — Ambulatory Visit (INDEPENDENT_AMBULATORY_CARE_PROVIDER_SITE_OTHER): Payer: Medicare HMO

## 2022-09-07 DIAGNOSIS — J309 Allergic rhinitis, unspecified: Secondary | ICD-10-CM | POA: Diagnosis not present

## 2022-09-09 ENCOUNTER — Ambulatory Visit (INDEPENDENT_AMBULATORY_CARE_PROVIDER_SITE_OTHER): Payer: Medicare HMO

## 2022-09-09 DIAGNOSIS — J309 Allergic rhinitis, unspecified: Secondary | ICD-10-CM

## 2022-09-10 ENCOUNTER — Other Ambulatory Visit: Payer: Self-pay | Admitting: Family Medicine

## 2022-09-12 NOTE — Telephone Encounter (Signed)
Yes please. Looks like they want a 90 day refill. Thank you

## 2022-09-12 NOTE — Telephone Encounter (Signed)
Is this ok to refill? I didn't see it mentioned in your last office note.

## 2022-09-14 ENCOUNTER — Ambulatory Visit (INDEPENDENT_AMBULATORY_CARE_PROVIDER_SITE_OTHER): Payer: Medicare HMO

## 2022-09-14 DIAGNOSIS — J309 Allergic rhinitis, unspecified: Secondary | ICD-10-CM

## 2022-09-16 ENCOUNTER — Ambulatory Visit (INDEPENDENT_AMBULATORY_CARE_PROVIDER_SITE_OTHER): Payer: Medicare HMO

## 2022-09-16 DIAGNOSIS — J309 Allergic rhinitis, unspecified: Secondary | ICD-10-CM | POA: Diagnosis not present

## 2022-09-21 ENCOUNTER — Ambulatory Visit (INDEPENDENT_AMBULATORY_CARE_PROVIDER_SITE_OTHER): Payer: Medicare HMO

## 2022-09-21 DIAGNOSIS — J309 Allergic rhinitis, unspecified: Secondary | ICD-10-CM | POA: Diagnosis not present

## 2022-09-23 ENCOUNTER — Ambulatory Visit (INDEPENDENT_AMBULATORY_CARE_PROVIDER_SITE_OTHER): Payer: Medicare HMO

## 2022-09-23 DIAGNOSIS — J309 Allergic rhinitis, unspecified: Secondary | ICD-10-CM | POA: Diagnosis not present

## 2022-09-30 ENCOUNTER — Ambulatory Visit (INDEPENDENT_AMBULATORY_CARE_PROVIDER_SITE_OTHER): Payer: Medicare HMO

## 2022-09-30 DIAGNOSIS — J309 Allergic rhinitis, unspecified: Secondary | ICD-10-CM

## 2022-10-05 ENCOUNTER — Ambulatory Visit (INDEPENDENT_AMBULATORY_CARE_PROVIDER_SITE_OTHER): Payer: Self-pay

## 2022-10-05 DIAGNOSIS — J309 Allergic rhinitis, unspecified: Secondary | ICD-10-CM | POA: Diagnosis not present

## 2022-10-05 DIAGNOSIS — E7801 Familial hypercholesterolemia: Secondary | ICD-10-CM | POA: Diagnosis not present

## 2022-10-05 DIAGNOSIS — K2100 Gastro-esophageal reflux disease with esophagitis, without bleeding: Secondary | ICD-10-CM | POA: Diagnosis not present

## 2022-10-05 DIAGNOSIS — E119 Type 2 diabetes mellitus without complications: Secondary | ICD-10-CM | POA: Diagnosis not present

## 2022-10-05 DIAGNOSIS — E7849 Other hyperlipidemia: Secondary | ICD-10-CM | POA: Diagnosis not present

## 2022-10-07 ENCOUNTER — Ambulatory Visit (INDEPENDENT_AMBULATORY_CARE_PROVIDER_SITE_OTHER): Payer: Medicare HMO

## 2022-10-07 DIAGNOSIS — Z8249 Family history of ischemic heart disease and other diseases of the circulatory system: Secondary | ICD-10-CM | POA: Diagnosis not present

## 2022-10-07 DIAGNOSIS — J309 Allergic rhinitis, unspecified: Secondary | ICD-10-CM | POA: Diagnosis not present

## 2022-10-07 DIAGNOSIS — I10 Essential (primary) hypertension: Secondary | ICD-10-CM | POA: Diagnosis not present

## 2022-10-07 DIAGNOSIS — Z791 Long term (current) use of non-steroidal anti-inflammatories (NSAID): Secondary | ICD-10-CM | POA: Diagnosis not present

## 2022-10-07 DIAGNOSIS — E669 Obesity, unspecified: Secondary | ICD-10-CM | POA: Diagnosis not present

## 2022-10-07 DIAGNOSIS — Z833 Family history of diabetes mellitus: Secondary | ICD-10-CM | POA: Diagnosis not present

## 2022-10-07 DIAGNOSIS — F324 Major depressive disorder, single episode, in partial remission: Secondary | ICD-10-CM | POA: Diagnosis not present

## 2022-10-07 DIAGNOSIS — E785 Hyperlipidemia, unspecified: Secondary | ICD-10-CM | POA: Diagnosis not present

## 2022-10-07 DIAGNOSIS — J302 Other seasonal allergic rhinitis: Secondary | ICD-10-CM | POA: Diagnosis not present

## 2022-10-07 DIAGNOSIS — Z008 Encounter for other general examination: Secondary | ICD-10-CM | POA: Diagnosis not present

## 2022-10-07 DIAGNOSIS — Z6831 Body mass index (BMI) 31.0-31.9, adult: Secondary | ICD-10-CM | POA: Diagnosis not present

## 2022-10-07 DIAGNOSIS — F411 Generalized anxiety disorder: Secondary | ICD-10-CM | POA: Diagnosis not present

## 2022-10-12 ENCOUNTER — Ambulatory Visit (INDEPENDENT_AMBULATORY_CARE_PROVIDER_SITE_OTHER): Payer: Medicare HMO

## 2022-10-12 DIAGNOSIS — J309 Allergic rhinitis, unspecified: Secondary | ICD-10-CM

## 2022-10-12 DIAGNOSIS — F5221 Male erectile disorder: Secondary | ICD-10-CM | POA: Diagnosis not present

## 2022-10-12 DIAGNOSIS — R945 Abnormal results of liver function studies: Secondary | ICD-10-CM | POA: Diagnosis not present

## 2022-10-12 DIAGNOSIS — F411 Generalized anxiety disorder: Secondary | ICD-10-CM | POA: Diagnosis not present

## 2022-10-12 DIAGNOSIS — E291 Testicular hypofunction: Secondary | ICD-10-CM | POA: Diagnosis not present

## 2022-10-12 DIAGNOSIS — Z1389 Encounter for screening for other disorder: Secondary | ICD-10-CM | POA: Diagnosis not present

## 2022-10-12 DIAGNOSIS — Z23 Encounter for immunization: Secondary | ICD-10-CM | POA: Diagnosis not present

## 2022-10-12 DIAGNOSIS — M25561 Pain in right knee: Secondary | ICD-10-CM | POA: Diagnosis not present

## 2022-10-12 DIAGNOSIS — E7849 Other hyperlipidemia: Secondary | ICD-10-CM | POA: Diagnosis not present

## 2022-10-12 DIAGNOSIS — I10 Essential (primary) hypertension: Secondary | ICD-10-CM | POA: Diagnosis not present

## 2022-10-12 DIAGNOSIS — E1159 Type 2 diabetes mellitus with other circulatory complications: Secondary | ICD-10-CM | POA: Diagnosis not present

## 2022-10-12 DIAGNOSIS — Z1331 Encounter for screening for depression: Secondary | ICD-10-CM | POA: Diagnosis not present

## 2022-10-12 DIAGNOSIS — N521 Erectile dysfunction due to diseases classified elsewhere: Secondary | ICD-10-CM | POA: Diagnosis not present

## 2022-10-14 ENCOUNTER — Ambulatory Visit (INDEPENDENT_AMBULATORY_CARE_PROVIDER_SITE_OTHER): Payer: Medicare HMO

## 2022-10-14 DIAGNOSIS — J309 Allergic rhinitis, unspecified: Secondary | ICD-10-CM | POA: Diagnosis not present

## 2022-10-19 ENCOUNTER — Ambulatory Visit (INDEPENDENT_AMBULATORY_CARE_PROVIDER_SITE_OTHER): Payer: Medicare HMO

## 2022-10-19 DIAGNOSIS — J309 Allergic rhinitis, unspecified: Secondary | ICD-10-CM | POA: Diagnosis not present

## 2022-10-21 ENCOUNTER — Ambulatory Visit (INDEPENDENT_AMBULATORY_CARE_PROVIDER_SITE_OTHER): Payer: Medicare HMO

## 2022-10-21 DIAGNOSIS — J309 Allergic rhinitis, unspecified: Secondary | ICD-10-CM

## 2022-10-26 ENCOUNTER — Ambulatory Visit: Payer: Self-pay

## 2022-10-26 DIAGNOSIS — J309 Allergic rhinitis, unspecified: Secondary | ICD-10-CM | POA: Diagnosis not present

## 2022-11-04 ENCOUNTER — Ambulatory Visit (INDEPENDENT_AMBULATORY_CARE_PROVIDER_SITE_OTHER): Payer: Medicare HMO

## 2022-11-04 DIAGNOSIS — J309 Allergic rhinitis, unspecified: Secondary | ICD-10-CM | POA: Diagnosis not present

## 2022-11-09 ENCOUNTER — Ambulatory Visit (INDEPENDENT_AMBULATORY_CARE_PROVIDER_SITE_OTHER): Payer: Medicare HMO

## 2022-11-09 DIAGNOSIS — J309 Allergic rhinitis, unspecified: Secondary | ICD-10-CM | POA: Diagnosis not present

## 2022-11-09 MED ORDER — MOMETASONE FUROATE 50 MCG/ACT NA SUSP: 2.0000 | Freq: Every day | NASAL | 1 refills | Status: DC

## 2022-11-09 MED ORDER — IPRATROPIUM BROMIDE 0.06 % NA SOLN: 2.0000 | Freq: Two times a day (BID) | NASAL | 0 refills | Status: DC | PRN

## 2022-11-09 MED ORDER — AZELASTINE HCL 137 MCG/SPRAY NA SOLN: 2.0000 | Freq: Two times a day (BID) | NASAL | 1 refills | Status: DC

## 2022-11-18 ENCOUNTER — Ambulatory Visit (INDEPENDENT_AMBULATORY_CARE_PROVIDER_SITE_OTHER): Payer: Self-pay

## 2022-11-18 DIAGNOSIS — J309 Allergic rhinitis, unspecified: Secondary | ICD-10-CM

## 2022-11-25 ENCOUNTER — Ambulatory Visit (INDEPENDENT_AMBULATORY_CARE_PROVIDER_SITE_OTHER): Payer: Medicare HMO

## 2022-11-25 DIAGNOSIS — J309 Allergic rhinitis, unspecified: Secondary | ICD-10-CM | POA: Diagnosis not present

## 2022-12-01 ENCOUNTER — Other Ambulatory Visit: Payer: Self-pay | Admitting: Allergy & Immunology

## 2022-12-02 ENCOUNTER — Ambulatory Visit (INDEPENDENT_AMBULATORY_CARE_PROVIDER_SITE_OTHER): Payer: Medicare HMO

## 2022-12-02 DIAGNOSIS — J309 Allergic rhinitis, unspecified: Secondary | ICD-10-CM | POA: Diagnosis not present

## 2022-12-04 IMAGING — US US ABDOMEN COMPLETE
1 series · 14 of 25 positions shown · non-contrast
Comparison: None Available.

CLINICAL DATA: Abnormal LFTs.

EXAM:
ABDOMEN ULTRASOUND COMPLETE

[Series 1: us abdomen complete · 14 of 97 slices shown]
[im 1/97]
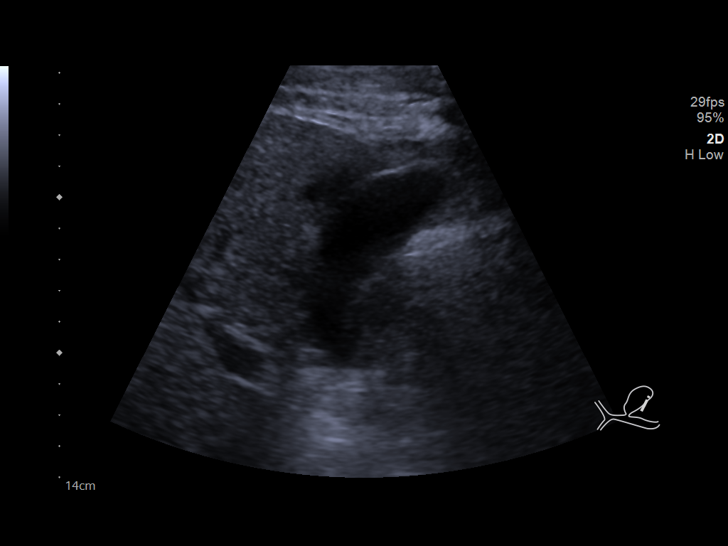
[im 9/97]
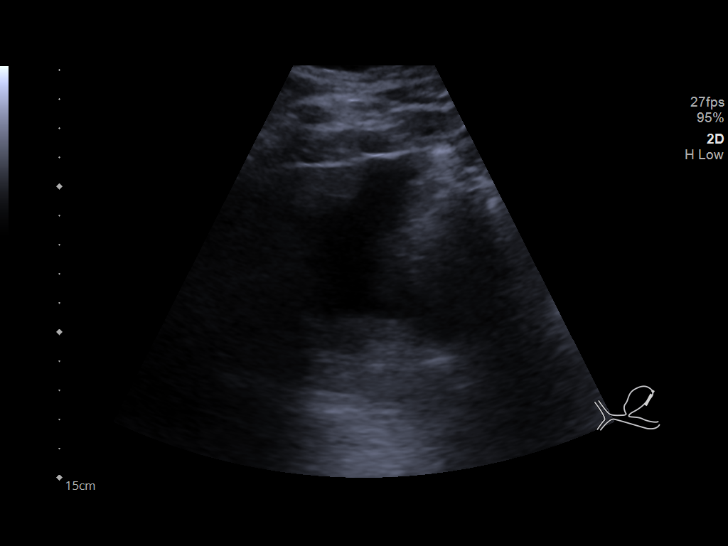
[im 17/97]
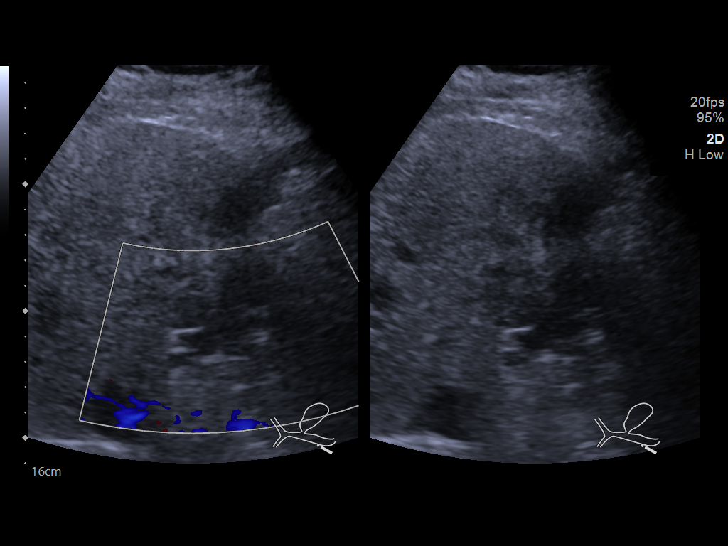
[im 25/97]
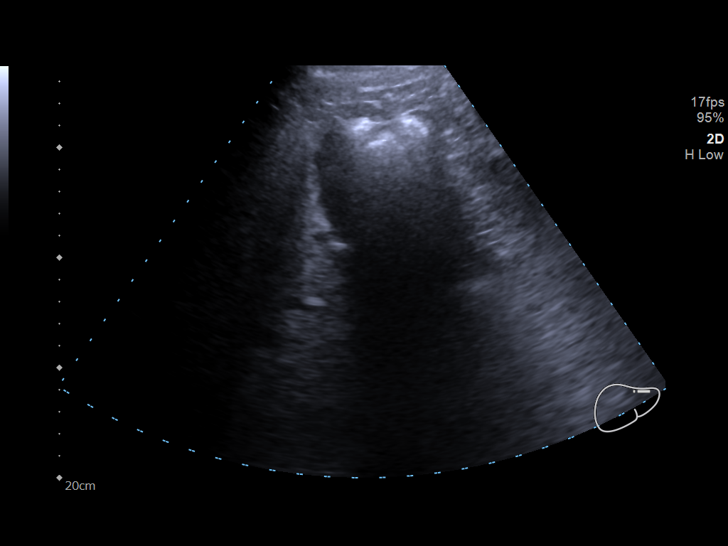
[im 33/97]
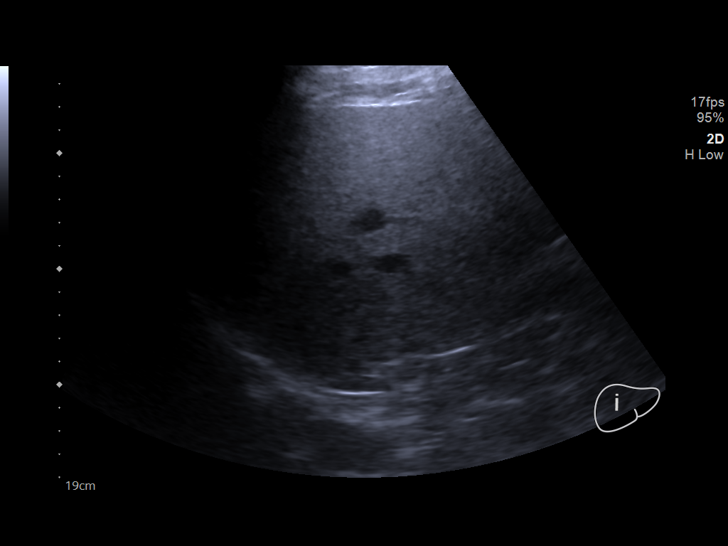
[im 37/97]
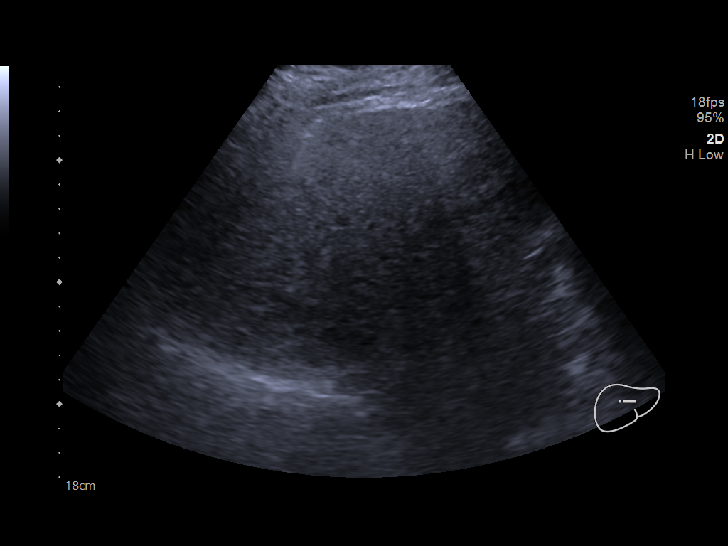
[im 45/97]
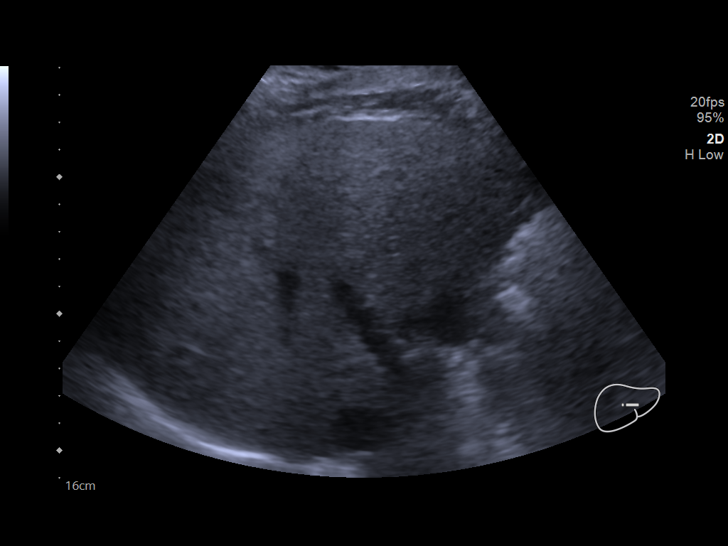
[im 53/97]
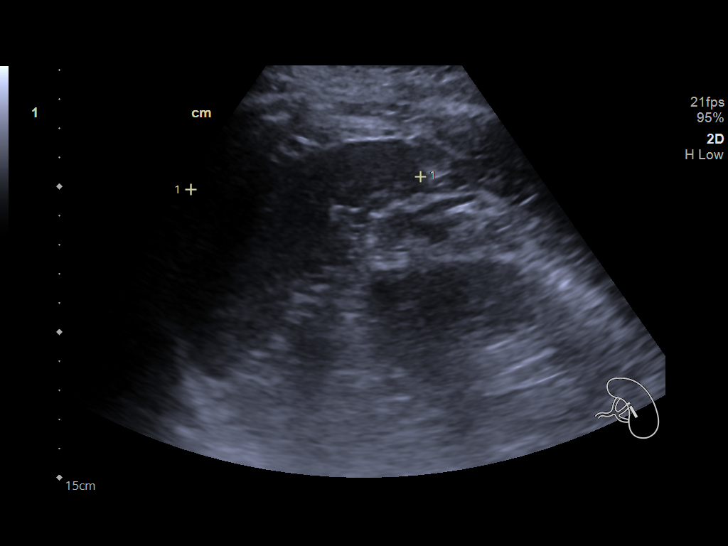
[im 61/97]
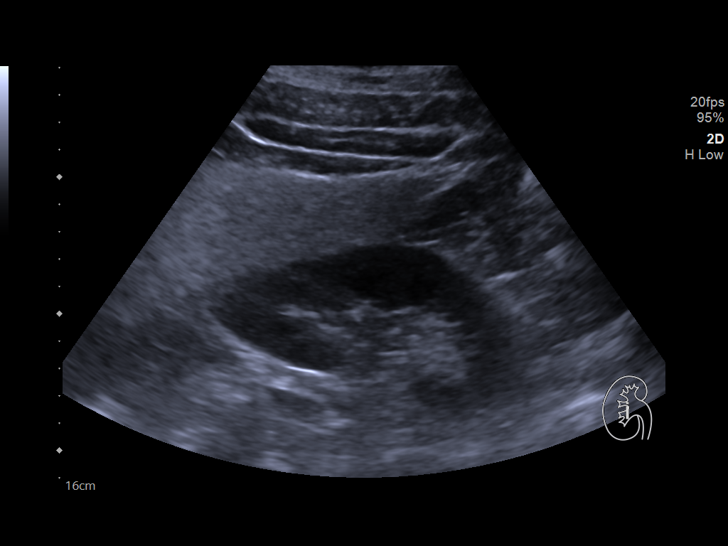
[im 65/97]
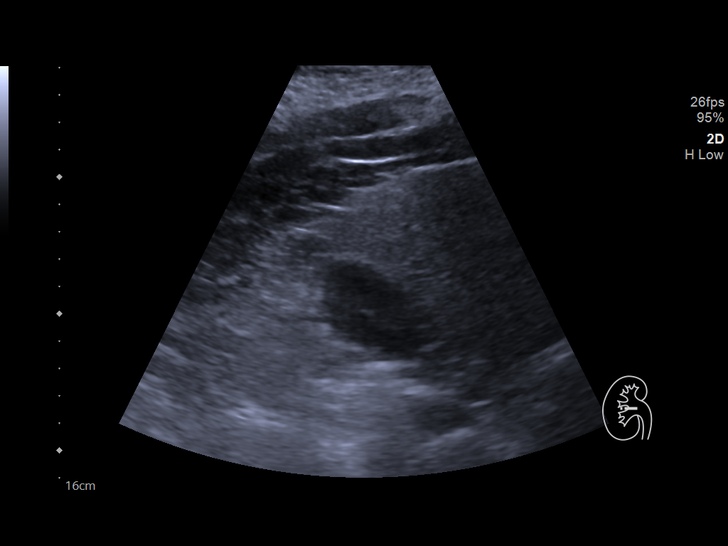
[im 73/97]
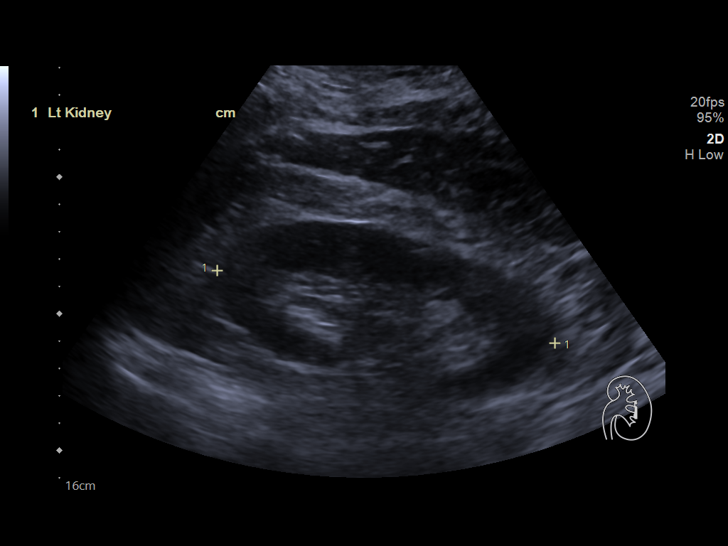
[im 81/97]
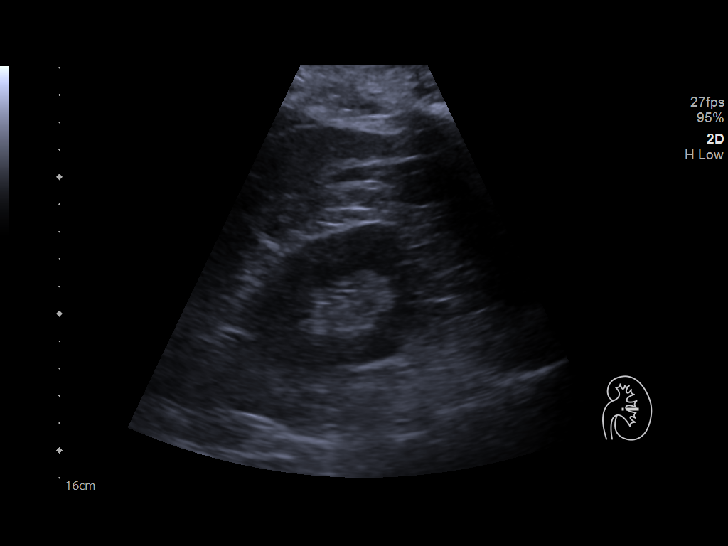
[im 89/97]
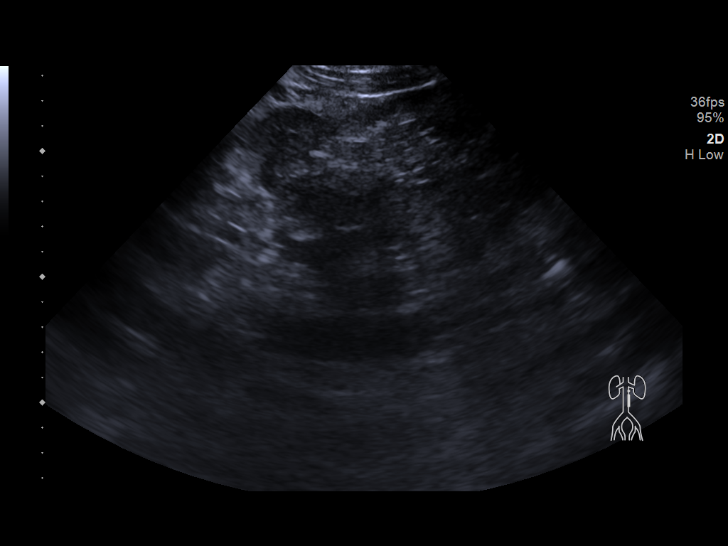
[im 97/97]
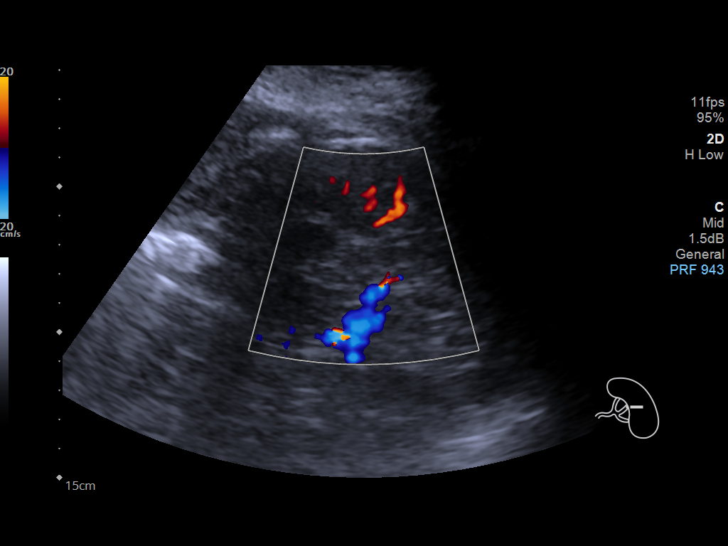

[14 of 25 positions shown; findings below may reference images not displayed]

FINDINGS: Gallbladder: No gallstones or wall thickening visualized. No
sonographic Murphy sign noted by sonographer.

Common bile duct: Not well visualized or assessed

Liver: Increased echogenicity. No focal lesion. Portal vein is
patent on color Doppler imaging with normal direction of blood flow
towards the liver.

IVC: No abnormality visualized.

Pancreas: Not well visualized.

Spleen: Size and appearance within normal limits.

Right Kidney: Length: 12.2 cm. Echogenicity within normal limits. No
mass or hydronephrosis visualized.

Left Kidney: Length: 12.6 cm. Echogenicity within normal limits. No
mass or hydronephrosis visualized.

Abdominal aorta: No aneurysm visualized.

Other findings: None.
IMPRESSION: Increased hepatic parenchymal echogenicity suggestive of steatosis.

No cholelithiasis or sonographic evidence for acute cholecystitis.

The common bile duct is not well assessed due to study limitations.

## 2022-12-07 ENCOUNTER — Ambulatory Visit (INDEPENDENT_AMBULATORY_CARE_PROVIDER_SITE_OTHER): Payer: Self-pay

## 2022-12-07 DIAGNOSIS — J309 Allergic rhinitis, unspecified: Secondary | ICD-10-CM

## 2022-12-15 DIAGNOSIS — H2513 Age-related nuclear cataract, bilateral: Secondary | ICD-10-CM | POA: Diagnosis not present

## 2022-12-15 DIAGNOSIS — Q141 Congenital malformation of retina: Secondary | ICD-10-CM | POA: Diagnosis not present

## 2022-12-15 DIAGNOSIS — H524 Presbyopia: Secondary | ICD-10-CM | POA: Diagnosis not present

## 2022-12-15 DIAGNOSIS — R7303 Prediabetes: Secondary | ICD-10-CM | POA: Diagnosis not present

## 2022-12-15 DIAGNOSIS — H5203 Hypermetropia, bilateral: Secondary | ICD-10-CM | POA: Diagnosis not present

## 2022-12-26 DIAGNOSIS — J3081 Allergic rhinitis due to animal (cat) (dog) hair and dander: Secondary | ICD-10-CM | POA: Diagnosis not present

## 2022-12-26 NOTE — Progress Notes (Signed)
EXP 12/26/23

## 2022-12-27 DIAGNOSIS — J3089 Other allergic rhinitis: Secondary | ICD-10-CM | POA: Diagnosis not present

## 2022-12-30 ENCOUNTER — Ambulatory Visit (INDEPENDENT_AMBULATORY_CARE_PROVIDER_SITE_OTHER): Payer: Medicare HMO

## 2022-12-30 DIAGNOSIS — J309 Allergic rhinitis, unspecified: Secondary | ICD-10-CM | POA: Diagnosis not present

## 2023-01-06 ENCOUNTER — Ambulatory Visit (INDEPENDENT_AMBULATORY_CARE_PROVIDER_SITE_OTHER): Payer: Medicare HMO

## 2023-01-06 DIAGNOSIS — J309 Allergic rhinitis, unspecified: Secondary | ICD-10-CM | POA: Diagnosis not present

## 2023-01-11 ENCOUNTER — Ambulatory Visit (INDEPENDENT_AMBULATORY_CARE_PROVIDER_SITE_OTHER): Payer: Medicare HMO

## 2023-01-11 DIAGNOSIS — J309 Allergic rhinitis, unspecified: Secondary | ICD-10-CM | POA: Diagnosis not present

## 2023-01-25 ENCOUNTER — Ambulatory Visit (INDEPENDENT_AMBULATORY_CARE_PROVIDER_SITE_OTHER): Payer: Medicare HMO

## 2023-01-25 DIAGNOSIS — J309 Allergic rhinitis, unspecified: Secondary | ICD-10-CM | POA: Diagnosis not present

## 2023-01-27 ENCOUNTER — Other Ambulatory Visit: Payer: Self-pay | Admitting: Family Medicine

## 2023-01-27 ENCOUNTER — Encounter: Payer: Self-pay | Admitting: Allergy & Immunology

## 2023-01-27 ENCOUNTER — Ambulatory Visit: Payer: Medicare HMO | Admitting: Allergy & Immunology

## 2023-01-27 ENCOUNTER — Other Ambulatory Visit: Payer: Self-pay

## 2023-01-27 VITALS — BP 122/72 | HR 94 | Temp 97.7°F | Ht 61.22 in | Wt 190.8 lb

## 2023-01-27 DIAGNOSIS — K219 Gastro-esophageal reflux disease without esophagitis: Secondary | ICD-10-CM

## 2023-01-27 DIAGNOSIS — J3089 Other allergic rhinitis: Secondary | ICD-10-CM

## 2023-01-27 DIAGNOSIS — J302 Other seasonal allergic rhinitis: Secondary | ICD-10-CM | POA: Diagnosis not present

## 2023-01-27 MED ORDER — LEVOCETIRIZINE DIHYDROCHLORIDE 5 MG PO TABS: 5.0000 mg | ORAL_TABLET | Freq: Every evening | ORAL | 3 refills | Status: AC

## 2023-01-27 MED ORDER — AZELASTINE HCL 137 MCG/SPRAY NA SOLN: 2.0000 | Freq: Two times a day (BID) | NASAL | 1 refills | Status: DC | PRN

## 2023-01-27 MED ORDER — MOMETASONE FUROATE 50 MCG/ACT NA SUSP: 2.0000 | Freq: Every day | NASAL | 1 refills | Status: DC | PRN

## 2023-01-27 MED ORDER — IPRATROPIUM BROMIDE 0.06 % NA SOLN: 2.0000 | Freq: Two times a day (BID) | NASAL | 1 refills | Status: DC | PRN

## 2023-01-27 NOTE — Progress Notes (Signed)
FOLLOW UP  Date of Service/Encounter:  01/27/23   Assessment:   Seasonal and perennial allergic rhinitis - on allergen immunotherapy with excellent  control of symptoms   Gastroesophageal reflux disease - off of all medications   Cough - resolved    Oral lesions including on the time    Plan/Recommendations:   Perennial and seasonal allergic rhinitis - Continue allergy shots at the same schedule. - We will continue for 3-5 years (August 2027 or August 2029 - Continue with Xyzal (levocetirizine) 5 mg daily. - I am glad that you have been able to wean your nasal sprays to as needed!   2. Reflux - Continue with a reflux medication as needed. - We can send you a prescription to use if needed.   3. Return in about 1 year (around 01/27/2024). You can have the follow up appointment with Dr. Dellis Anes or a Nurse Practicioner (our Nurse Practitioners are excellent and always have Physician oversight!).    Subjective:   [name] Harper is a 62 y.o. male presenting today for follow up of  Chief Complaint  Patient presents with   Follow-up    [name] Harper has a history of the following: Patient Active Problem List   Diagnosis Date Noted   Seasonal and perennial allergic rhinitis 07/12/2019   Cough 07/12/2019   Non-intractable vomiting    Fatty liver 01/25/2018   Chronic cough 01/25/2018   Chronic superficial gastritis without bleeding    Polyp of descending colon    Encounter for screening colonoscopy 03/02/2017   Abnormal LFTs 03/02/2017   Nausea without vomiting 03/02/2017   Post-nasal drip 11/29/2016   Perennial allergic rhinitis 11/29/2016   Gastroesophageal reflux disease 11/29/2016   History of total knee arthroplasty 10/29/2014    History obtained from: chart review and patient.  Discussed the use of AI scribe software for clinical note transcription with the patient and/or guardian, who gave verbal consent to proceed.  [name] Harper is a 62 y.o. male presenting  for a follow up visit.  He was last seen in in December 2023 by Thermon Leyland.  At that time, he decided to restart his allergy shots.  We continue with Xyzal 5 mg daily as well as Flonase.  We also continue with his reflux medication.  He did decide to restart allergy shots and has been very consistent with these.  Since the last visit, he has done fairly well.   [name] Harper has been managing symptoms with allergy shots and medications. He reports a significant improvement in symptoms, reducing the frequency of nasal sprays use from twice daily to once daily, and sometimes not at all. He is also on levocetirizine 5mg , which he takes daily. He denies any recent illnesses such as sinus infections. He continues to receive allergy shots without any adverse reactions and waits for about 20-30 minutes post-injection before leaving the clinic.   [name] Harper is on allergen immunotherapy. He receives two injections. Immunotherapy script #1 contains trees, grasses, cat, and dog. He currently receives 0.90mL of the RED vial (1/100). Immunotherapy script #2 contains  ragweed, molds, dust mites, and cockroach. He currently receives 0.26mL of the RED vial (1/100). He started shots January of 2024 and reached maintenance in August of 2024.  GERD is controlled with dietary changes. He does not use a reflux medication daily.   He also mentions taking medication for anxiety and depression, which he attributes to the passing of his spouse in late 2019. Despite these challenges, he has been  keeping active with housework and outdoor activities, although he notes feeling tired, which he attributes to his anxiety medication.  Otherwise, there have been no changes to his past medical history, surgical history, family history, or social history.    Review of systems otherwise negative other than that mentioned in the HPI.    Objective:   Blood pressure 122/72, pulse 94, temperature 97.7 F (36.5 C), height 5' 1.22" (1.555 m), weight  190 lb 12.8 oz (86.5 kg), SpO2 97%. Body mass index is 35.79 kg/m.    Physical Exam Vitals reviewed.  Constitutional:      Appearance: He is well-developed.     Comments: Very pleasant.  HENT:     Head: Normocephalic and atraumatic.     Right Ear: Tympanic membrane, ear canal and external ear normal.     Left Ear: Tympanic membrane, ear canal and external ear normal.     Nose: No nasal deformity, septal deviation, mucosal edema or rhinorrhea.     Right Turbinates: Enlarged, swollen and pale.     Left Turbinates: Enlarged, swollen and pale.     Right Sinus: No maxillary sinus tenderness or frontal sinus tenderness.     Left Sinus: No maxillary sinus tenderness or frontal sinus tenderness.     Mouth/Throat:     Mouth: Mucous membranes are not pale and not dry.     Pharynx: Uvula midline.  Eyes:     General: Lids are normal. No allergic shiner.       Right eye: No discharge.        Left eye: No discharge.     Conjunctiva/sclera: Conjunctivae normal.     Right eye: Right conjunctiva is not injected. No chemosis.    Left eye: Left conjunctiva is not injected. No chemosis.    Pupils: Pupils are equal, round, and reactive to light.  Cardiovascular:     Rate and Rhythm: Normal rate and regular rhythm.     Heart sounds: Normal heart sounds.  Pulmonary:     Effort: Pulmonary effort is normal. No tachypnea, accessory muscle usage or respiratory distress.     Breath sounds: Normal breath sounds. No wheezing, rhonchi or rales.     Comments: Moving air well in all lung fields.  No increased work of breathing. Chest:     Chest wall: No tenderness.  Lymphadenopathy:     Cervical: No cervical adenopathy.  Skin:    Coloration: Skin is not pale.     Findings: No abrasion, erythema, petechiae or rash. Rash is not papular, urticarial or vesicular.  Neurological:     Mental Status: He is alert.  Psychiatric:        Behavior: Behavior is cooperative.      Diagnostic studies: none      Malachi Bonds, MD  [facility]

## 2023-01-27 NOTE — Patient Instructions (Addendum)
Perennial and seasonal allergic rhinitis - Continue allergy shots at the same schedule. - We will continue for 3-5 years (August 2027 or August 2029 - Continue with Xyzal (levocetirizine) 5 mg daily. - I am glad that you have been able to wean your nasal sprays to as needed!   2. Reflux - Continue with a reflux medication as needed. - We can send you a prescription to use if needed.   3. Return in about 1 year (around 01/27/2024). You can have the follow up appointment with Dr. Dellis Anes or a Nurse Practicioner (our Nurse Practitioners are excellent and always have Physician oversight!).    Please inform us of any Emergency Department visits, hospitalizations, or changes in symptoms. Call us before going to the ED for breathing or allergy symptoms since we might be able to fit you in for a sick visit. Feel free to contact us anytime with any questions, problems, or concerns.  It was a pleasure to see you again today!  Websites that have reliable patient information: 1. American Academy of Asthma, Allergy, and Immunology: www.aaaai.org 2. Food Allergy Research and Education (FARE): foodallergy.org 3. Mothers of Asthmatics: http://www.asthmacommunitynetwork.org 4. American College of Allergy, Asthma, and Immunology: www.acaai.org      "Like" [place] on Facebook and Instagram for our latest updates!      A healthy democracy works best when [employer] participate! Make sure you are registered to vote! If you have moved or changed any of your contact information, you will need to get this updated before voting! Scan the QR codes below to learn more!

## 2023-02-01 ENCOUNTER — Ambulatory Visit (INDEPENDENT_AMBULATORY_CARE_PROVIDER_SITE_OTHER): Payer: Self-pay

## 2023-02-01 DIAGNOSIS — J309 Allergic rhinitis, unspecified: Secondary | ICD-10-CM | POA: Diagnosis not present

## 2023-02-10 ENCOUNTER — Ambulatory Visit (INDEPENDENT_AMBULATORY_CARE_PROVIDER_SITE_OTHER): Payer: Medicare HMO

## 2023-02-10 DIAGNOSIS — E1159 Type 2 diabetes mellitus with other circulatory complications: Secondary | ICD-10-CM | POA: Diagnosis not present

## 2023-02-10 DIAGNOSIS — E119 Type 2 diabetes mellitus without complications: Secondary | ICD-10-CM | POA: Diagnosis not present

## 2023-02-10 DIAGNOSIS — I10 Essential (primary) hypertension: Secondary | ICD-10-CM | POA: Diagnosis not present

## 2023-02-10 DIAGNOSIS — E7800 Pure hypercholesterolemia, unspecified: Secondary | ICD-10-CM | POA: Diagnosis not present

## 2023-02-10 DIAGNOSIS — J309 Allergic rhinitis, unspecified: Secondary | ICD-10-CM

## 2023-02-10 DIAGNOSIS — E782 Mixed hyperlipidemia: Secondary | ICD-10-CM | POA: Diagnosis not present

## 2023-02-10 DIAGNOSIS — R7989 Other specified abnormal findings of blood chemistry: Secondary | ICD-10-CM | POA: Diagnosis not present

## 2023-02-16 DIAGNOSIS — M25561 Pain in right knee: Secondary | ICD-10-CM | POA: Diagnosis not present

## 2023-02-16 DIAGNOSIS — Z6834 Body mass index (BMI) 34.0-34.9, adult: Secondary | ICD-10-CM | POA: Diagnosis not present

## 2023-02-16 DIAGNOSIS — F411 Generalized anxiety disorder: Secondary | ICD-10-CM | POA: Diagnosis not present

## 2023-02-16 DIAGNOSIS — I10 Essential (primary) hypertension: Secondary | ICD-10-CM | POA: Diagnosis not present

## 2023-02-16 DIAGNOSIS — E7849 Other hyperlipidemia: Secondary | ICD-10-CM | POA: Diagnosis not present

## 2023-02-16 DIAGNOSIS — E291 Testicular hypofunction: Secondary | ICD-10-CM | POA: Diagnosis not present

## 2023-02-16 DIAGNOSIS — M7711 Lateral epicondylitis, right elbow: Secondary | ICD-10-CM | POA: Diagnosis not present

## 2023-02-16 DIAGNOSIS — N521 Erectile dysfunction due to diseases classified elsewhere: Secondary | ICD-10-CM | POA: Diagnosis not present

## 2023-02-16 DIAGNOSIS — E1159 Type 2 diabetes mellitus with other circulatory complications: Secondary | ICD-10-CM | POA: Diagnosis not present

## 2023-02-16 DIAGNOSIS — R945 Abnormal results of liver function studies: Secondary | ICD-10-CM | POA: Diagnosis not present

## 2023-02-16 DIAGNOSIS — F5221 Male erectile disorder: Secondary | ICD-10-CM | POA: Diagnosis not present

## 2023-02-16 DIAGNOSIS — F3341 Major depressive disorder, recurrent, in partial remission: Secondary | ICD-10-CM | POA: Diagnosis not present

## 2023-02-24 ENCOUNTER — Ambulatory Visit (INDEPENDENT_AMBULATORY_CARE_PROVIDER_SITE_OTHER): Payer: No Typology Code available for payment source

## 2023-02-24 DIAGNOSIS — J309 Allergic rhinitis, unspecified: Secondary | ICD-10-CM | POA: Diagnosis not present

## 2023-03-08 ENCOUNTER — Ambulatory Visit (INDEPENDENT_AMBULATORY_CARE_PROVIDER_SITE_OTHER): Payer: Self-pay

## 2023-03-08 DIAGNOSIS — J309 Allergic rhinitis, unspecified: Secondary | ICD-10-CM

## 2023-03-17 ENCOUNTER — Ambulatory Visit (INDEPENDENT_AMBULATORY_CARE_PROVIDER_SITE_OTHER): Payer: Self-pay

## 2023-03-17 DIAGNOSIS — J309 Allergic rhinitis, unspecified: Secondary | ICD-10-CM | POA: Diagnosis not present

## 2023-03-24 ENCOUNTER — Ambulatory Visit (INDEPENDENT_AMBULATORY_CARE_PROVIDER_SITE_OTHER): Payer: No Typology Code available for payment source

## 2023-03-24 DIAGNOSIS — J309 Allergic rhinitis, unspecified: Secondary | ICD-10-CM

## 2023-03-31 ENCOUNTER — Ambulatory Visit (INDEPENDENT_AMBULATORY_CARE_PROVIDER_SITE_OTHER): Payer: Self-pay

## 2023-03-31 DIAGNOSIS — J309 Allergic rhinitis, unspecified: Secondary | ICD-10-CM | POA: Diagnosis not present

## 2023-04-07 ENCOUNTER — Ambulatory Visit (INDEPENDENT_AMBULATORY_CARE_PROVIDER_SITE_OTHER): Payer: No Typology Code available for payment source | Admitting: *Deleted

## 2023-04-07 DIAGNOSIS — J309 Allergic rhinitis, unspecified: Secondary | ICD-10-CM | POA: Diagnosis not present

## 2023-04-12 ENCOUNTER — Telehealth: Payer: Self-pay

## 2023-04-12 ENCOUNTER — Other Ambulatory Visit (HOSPITAL_COMMUNITY): Payer: Self-pay

## 2023-04-12 NOTE — Telephone Encounter (Signed)
Pharmacy Patient Advocate Encounter  Received notification from [employer] [employer] that Prior Authorization for Mometasone Furoate 50MCG/ACT suspension has been APPROVED from 04-12-2023 to 04-11-2024   PA #/Case ID/Reference #: [id number]

## 2023-04-12 NOTE — Telephone Encounter (Signed)
Pharmacy Patient Advocate Encounter   Received notification from  Patient Insurance  that prior authorization for Mometasone Furoate 50MCG/ACT suspension is required/requested.   Insurance verification completed.   The patient is insured through [employer] [facility] [employer] .   Per test claim: PA required; PA submitted to above mentioned insurance via CoverMyMeds Key/confirmation #/EOC [id number] Status is pending

## 2023-04-14 ENCOUNTER — Ambulatory Visit (INDEPENDENT_AMBULATORY_CARE_PROVIDER_SITE_OTHER): Payer: No Typology Code available for payment source

## 2023-04-14 DIAGNOSIS — J309 Allergic rhinitis, unspecified: Secondary | ICD-10-CM

## 2023-05-18 ENCOUNTER — Encounter (HOSPITAL_COMMUNITY): Payer: Self-pay

## 2023-05-18 ENCOUNTER — Other Ambulatory Visit: Payer: Self-pay

## 2023-05-18 ENCOUNTER — Emergency Department (HOSPITAL_COMMUNITY)

## 2023-05-18 ENCOUNTER — Inpatient Hospital Stay (HOSPITAL_COMMUNITY)
Admission: EM | Admit: 2023-05-18 | Discharge: 2023-05-20 | DRG: 372 | Disposition: A | Discharge status: Home / Self Care | Attending: Internal Medicine | Admitting: Internal Medicine

## 2023-05-18 DIAGNOSIS — A044 Other intestinal Escherichia coli infections: Secondary | ICD-10-CM | POA: Diagnosis not present

## 2023-05-18 DIAGNOSIS — E871 Hypo-osmolality and hyponatremia: Secondary | ICD-10-CM | POA: Diagnosis present

## 2023-05-18 DIAGNOSIS — D649 Anemia, unspecified: Secondary | ICD-10-CM | POA: Diagnosis present

## 2023-05-18 DIAGNOSIS — F419 Anxiety disorder, unspecified: Secondary | ICD-10-CM | POA: Diagnosis present

## 2023-05-18 DIAGNOSIS — E860 Dehydration: Secondary | ICD-10-CM | POA: Diagnosis present

## 2023-05-18 DIAGNOSIS — R112 Nausea with vomiting, unspecified: Secondary | ICD-10-CM | POA: Diagnosis present

## 2023-05-18 DIAGNOSIS — F32A Depression, unspecified: Secondary | ICD-10-CM | POA: Diagnosis present

## 2023-05-18 DIAGNOSIS — K529 Noninfective gastroenteritis and colitis, unspecified: Secondary | ICD-10-CM | POA: Diagnosis not present

## 2023-05-18 DIAGNOSIS — E872 Acidosis, unspecified: Secondary | ICD-10-CM | POA: Diagnosis present

## 2023-05-18 DIAGNOSIS — Z79899 Other long term (current) drug therapy: Secondary | ICD-10-CM

## 2023-05-18 DIAGNOSIS — R7303 Prediabetes: Secondary | ICD-10-CM | POA: Diagnosis present

## 2023-05-18 DIAGNOSIS — K2980 Duodenitis without bleeding: Secondary | ICD-10-CM | POA: Diagnosis present

## 2023-05-18 DIAGNOSIS — K219 Gastro-esophageal reflux disease without esophagitis: Secondary | ICD-10-CM | POA: Diagnosis present

## 2023-05-18 DIAGNOSIS — Z6835 Body mass index (BMI) 35.0-35.9, adult: Secondary | ICD-10-CM

## 2023-05-18 DIAGNOSIS — I10 Essential (primary) hypertension: Secondary | ICD-10-CM | POA: Diagnosis present

## 2023-05-18 DIAGNOSIS — R197 Diarrhea, unspecified: Secondary | ICD-10-CM

## 2023-05-18 DIAGNOSIS — Z1152 Encounter for screening for COVID-19: Secondary | ICD-10-CM

## 2023-05-18 DIAGNOSIS — K2970 Gastritis, unspecified, without bleeding: Secondary | ICD-10-CM | POA: Diagnosis present

## 2023-05-18 DIAGNOSIS — E7800 Pure hypercholesterolemia, unspecified: Secondary | ICD-10-CM | POA: Diagnosis present

## 2023-05-18 DIAGNOSIS — Z860100 Personal history of colon polyps, unspecified: Secondary | ICD-10-CM

## 2023-05-18 DIAGNOSIS — D751 Secondary polycythemia: Secondary | ICD-10-CM | POA: Diagnosis present

## 2023-05-18 DIAGNOSIS — E66812 Obesity, class 2: Secondary | ICD-10-CM | POA: Diagnosis present

## 2023-05-18 DIAGNOSIS — Z96651 Presence of right artificial knee joint: Secondary | ICD-10-CM | POA: Diagnosis present

## 2023-05-18 LAB — URINALYSIS, ROUTINE W REFLEX MICROSCOPIC
Bacteria, UA: NONE SEEN
Bilirubin Urine: NEGATIVE
Glucose, UA: NEGATIVE mg/dL
Hgb urine dipstick: NEGATIVE
Ketones, ur: NEGATIVE mg/dL
Leukocytes,Ua: NEGATIVE
Nitrite: NEGATIVE
Protein, ur: 100 mg/dL — AB
Specific Gravity, Urine: 1.024 (ref 1.005–1.030)
pH: 5 (ref 5.0–8.0)

## 2023-05-18 LAB — CBC
HCT: 52.6 % — ABNORMAL HIGH (ref 39.0–52.0)
Hemoglobin: 18.9 g/dL — ABNORMAL HIGH (ref 13.0–17.0)
MCH: 31 pg (ref 26.0–34.0)
MCHC: 35.9 g/dL (ref 30.0–36.0)
MCV: 86.4 fL (ref 80.0–100.0)
Platelets: 269 10*3/uL (ref 150–400)
RBC: 6.09 MIL/uL — ABNORMAL HIGH (ref 4.22–5.81)
RDW: 13.4 % (ref 11.5–15.5)
WBC: 16.8 10*3/uL — ABNORMAL HIGH (ref 4.0–10.5)
nRBC: 0 % (ref 0.0–0.2)

## 2023-05-18 LAB — TROPONIN I (HIGH SENSITIVITY): Troponin I (High Sensitivity): 7 ng/L (ref <18)

## 2023-05-18 LAB — BASIC METABOLIC PANEL WITH GFR
Anion gap: 14 (ref 5–15)
BUN: 22 mg/dL (ref 8–23)
CO2: 23 mmol/L (ref 22–32)
Calcium: 8.4 mg/dL — ABNORMAL LOW (ref 8.9–10.3)
Chloride: 94 mmol/L — ABNORMAL LOW (ref 98–111)
Creatinine, Ser: 1.07 mg/dL (ref 0.61–1.24)
GFR, Estimated: >60 mL/min (ref >60)
Glucose, Bld: 107 mg/dL — ABNORMAL HIGH (ref 70–99)
Potassium: 3.8 mmol/L (ref 3.5–5.1)
Sodium: 131 mmol/L — ABNORMAL LOW (ref 135–145)

## 2023-05-18 LAB — COMPREHENSIVE METABOLIC PANEL WITH GFR
ALT: 38 U/L (ref 0–44)
AST: 70 U/L — ABNORMAL HIGH (ref 15–41)
Albumin: 4.5 g/dL (ref 3.5–5.0)
Alkaline Phosphatase: 66 U/L (ref 38–126)
Anion gap: 19 — ABNORMAL HIGH (ref 5–15)
BUN: 26 mg/dL — ABNORMAL HIGH (ref 8–23)
CO2: 18 mmol/L — ABNORMAL LOW (ref 22–32)
Calcium: 8.8 mg/dL — ABNORMAL LOW (ref 8.9–10.3)
Chloride: 94 mmol/L — ABNORMAL LOW (ref 98–111)
Creatinine, Ser: 1.25 mg/dL — ABNORMAL HIGH (ref 0.61–1.24)
GFR, Estimated: >60 mL/min (ref >60)
Glucose, Bld: 116 mg/dL — ABNORMAL HIGH (ref 70–99)
Potassium: 3.5 mmol/L (ref 3.5–5.1)
Sodium: 131 mmol/L — ABNORMAL LOW (ref 135–145)
Total Bilirubin: 1.4 mg/dL — ABNORMAL HIGH (ref 0.0–1.2)
Total Protein: 8.4 g/dL — ABNORMAL HIGH (ref 6.5–8.1)

## 2023-05-18 LAB — LIPASE, BLOOD: Lipase: 36 U/L (ref 11–51)

## 2023-05-18 LAB — MAGNESIUM: Magnesium: 1.9 mg/dL (ref 1.7–2.4)

## 2023-05-18 MED ORDER — SODIUM CHLORIDE 0.9 % IV BOLUS
1000.0000 mL | Freq: Once | INTRAVENOUS | Status: AC
  Administered 2023-05-18: 1000 mL via INTRAVENOUS

## 2023-05-18 MED ORDER — MORPHINE SULFATE (PF) 2 MG/ML IV SOLN
2.0000 mg | INTRAVENOUS | Status: DC | PRN
  Administered 2023-05-19: 2 mg via INTRAVENOUS
  Filled 2023-05-18 (×2): qty 1

## 2023-05-18 MED ORDER — LACTATED RINGERS IV BOLUS
1000.0000 mL | Freq: Once | INTRAVENOUS | Status: AC
  Administered 2023-05-18: 1000 mL via INTRAVENOUS

## 2023-05-18 MED ORDER — SIMVASTATIN 20 MG PO TABS
20.0000 mg | ORAL_TABLET | Freq: Every day | ORAL | Status: DC
  Administered 2023-05-19: 20 mg via ORAL
  Filled 2023-05-18: qty 1

## 2023-05-18 MED ORDER — DIPHENHYDRAMINE HCL 50 MG/ML IJ SOLN
12.5000 mg | Freq: Once | INTRAMUSCULAR | Status: AC
  Administered 2023-05-18: 12.5 mg via INTRAVENOUS
  Filled 2023-05-18: qty 1

## 2023-05-18 MED ORDER — IOHEXOL 300 MG/ML  SOLN
100.0000 mL | Freq: Once | INTRAMUSCULAR | Status: AC | PRN
Start: 2023-05-18 — End: 2023-05-18
  Administered 2023-05-18: 100 mL via INTRAVENOUS

## 2023-05-18 MED ORDER — HYDROCHLOROTHIAZIDE 25 MG PO TABS
25.0000 mg | ORAL_TABLET | Freq: Every morning | ORAL | Status: DC
  Filled 2023-05-18: qty 1

## 2023-05-18 MED ORDER — DICYCLOMINE HCL 10 MG/ML IM SOLN
20.0000 mg | Freq: Once | INTRAMUSCULAR | Status: AC
  Administered 2023-05-18: 20 mg via INTRAMUSCULAR
  Filled 2023-05-18: qty 2

## 2023-05-18 MED ORDER — DROPERIDOL 2.5 MG/ML IJ SOLN
1.2500 mg | Freq: Once | INTRAMUSCULAR | Status: AC
  Administered 2023-05-18: 1.25 mg via INTRAVENOUS
  Filled 2023-05-18: qty 2

## 2023-05-18 MED ORDER — METRONIDAZOLE 500 MG/100ML IV SOLN
500.0000 mg | Freq: Two times a day (BID) | INTRAVENOUS | Status: DC
  Administered 2023-05-18 – 2023-05-19 (×3): 500 mg via INTRAVENOUS
  Filled 2023-05-18 (×4): qty 100

## 2023-05-18 MED ORDER — ACETAMINOPHEN 650 MG RE SUPP: 650.0000 mg | Freq: Four times a day (QID) | RECTAL | Status: DC | PRN

## 2023-05-18 MED ORDER — SODIUM CHLORIDE 0.9 % IV SOLN
2.0000 g | INTRAVENOUS | Status: DC
  Administered 2023-05-18 – 2023-05-20 (×2): 2 g via INTRAVENOUS
  Filled 2023-05-18 (×2): qty 20

## 2023-05-18 MED ORDER — HEPARIN SODIUM (PORCINE) 5000 UNIT/ML IJ SOLN
5000.0000 [IU] | Freq: Three times a day (TID) | INTRAMUSCULAR | Status: DC
  Administered 2023-05-19 – 2023-05-20 (×2): 5000 [IU] via SUBCUTANEOUS
  Filled 2023-05-18 (×2): qty 1

## 2023-05-18 MED ORDER — METOCLOPRAMIDE HCL 5 MG/ML IJ SOLN
10.0000 mg | Freq: Once | INTRAMUSCULAR | Status: AC
  Administered 2023-05-18: 10 mg via INTRAVENOUS
  Filled 2023-05-18: qty 2

## 2023-05-18 MED ORDER — ONDANSETRON HCL 4 MG/2ML IJ SOLN
4.0000 mg | Freq: Once | INTRAMUSCULAR | Status: AC
  Administered 2023-05-18: 4 mg via INTRAVENOUS
  Filled 2023-05-18: qty 2

## 2023-05-18 MED ORDER — ALUM & MAG HYDROXIDE-SIMETH 200-200-20 MG/5ML PO SUSP
30.0000 mL | Freq: Once | ORAL | Status: AC
  Administered 2023-05-18: 30 mL via ORAL
  Filled 2023-05-18: qty 30

## 2023-05-18 MED ORDER — ACETAMINOPHEN 325 MG PO TABS: 650.0000 mg | ORAL_TABLET | Freq: Four times a day (QID) | ORAL | Status: DC | PRN

## 2023-05-18 MED ORDER — CITALOPRAM HYDROBROMIDE 20 MG PO TABS
40.0000 mg | ORAL_TABLET | Freq: Every day | ORAL | Status: DC
  Administered 2023-05-19 – 2023-05-20 (×2): 40 mg via ORAL
  Filled 2023-05-18 (×2): qty 2

## 2023-05-18 MED ORDER — ONDANSETRON HCL 4 MG PO TABS: 4.0000 mg | ORAL_TABLET | Freq: Four times a day (QID) | ORAL | Status: DC | PRN

## 2023-05-18 MED ORDER — HYDRALAZINE HCL 20 MG/ML IJ SOLN: 5.0000 mg | INTRAMUSCULAR | Status: DC | PRN

## 2023-05-18 MED ORDER — ONDANSETRON HCL 4 MG/2ML IJ SOLN
4.0000 mg | Freq: Four times a day (QID) | INTRAMUSCULAR | Status: DC | PRN
  Administered 2023-05-19 – 2023-05-20 (×3): 4 mg via INTRAVENOUS
  Filled 2023-05-18 (×3): qty 2

## 2023-05-18 MED ORDER — ALBUTEROL SULFATE (2.5 MG/3ML) 0.083% IN NEBU: 2.5000 mg | INHALATION_SOLUTION | RESPIRATORY_TRACT | Status: DC | PRN

## 2023-05-18 MED ORDER — CLONAZEPAM 0.5 MG PO TABS: 0.5000 mg | ORAL_TABLET | Freq: Three times a day (TID) | ORAL | Status: DC | PRN

## 2023-05-18 MED ORDER — LACTATED RINGERS IV SOLN: INTRAVENOUS | Status: DC

## 2023-05-18 NOTE — ED Triage Notes (Signed)
 Pt arrived via POV c/o N/V/D and abdominal pain/cramping. Pt reports symptoms began last night and now endorses SOB.

## 2023-05-18 NOTE — Progress Notes (Signed)
   05/18/23 2244  TOC Brief Assessment  Insurance and Status Reviewed  Patient has primary care physician Yes  Home environment has been reviewed From home.  Prior level of function: Independent.  Prior/Current Home Services No current home services  Social Drivers of Health Review SDOH reviewed no interventions necessary  Readmission risk has been reviewed Yes  Transition of care needs no transition of care needs at this time   Transition of Care Department [facility]) has reviewed patient and no other TOC needs have been identified at this time. We will continue to monitor patient advancement through interdisciplinary progression rounds. If new patient needs arise, please place a TOC consult.

## 2023-05-18 NOTE — ED Notes (Signed)
 Patient transported to CT

## 2023-05-18 NOTE — H&P (Signed)
 [facility] H&P   Patient Demographics:    [name] Harper, is a 63 y.o. male  MRN: [id number]   DOB - 1960/08/29  Admit Date - 05/18/2023  Outpatient Primary MD for the patient is Sasser, Clarene Critchley, MD  Referring MD/NP/PA: Dr. Jarold Motto   Patient coming from: Home  Chief Complaint  Patient presents with   Emesis      HPI:    [name] Harper  is a 63 y.o. male, with past medical history of hypertension, anxiety/depression, hyperlipidemia, prediabetes, patient presents to ED secondary to complaints of nausea and vomiting, reports symptoms started yesterday at 7 PM, has been ongoing since, had multiple episodes of nausea and vomiting, no coffee-ground emesis, he reports diarrhea, but no melena, denies any sick contacts. -In ED workup significant for sodium 131, white blood cell count elevated at 16K, anemia with hemoglobin of 18.9, initially creatinine 1.25, but improved with hydration, repeat at 1.07, he had anion gap acidosis with bicarb of 18, anion gap of 19, but this has improved after IV hydration, but he remains with significant nausea and vomiting, CT abdomen was obtained significant for jejunitis, [facility] hospitalist consulted to admit    Review of systems:      A full 10 point Review of Systems was done, except as stated above, all other Review of Systems were negative.   With Past History of the following :    Past Medical History:  Diagnosis Date   Anxiety    Arthritis    Bronchitis    Complication of anesthesia    Depression    Hypercholesterolemia    Hypertension    PONV (postoperative nausea and vomiting)    Pre-diabetes    pt stated      Past Surgical History:  Procedure Laterality Date   ABDOMINAL SURGERY  2007   hiatal hernia repair   BIOPSY  03/28/2017   Procedure: BIOPSY;  Surgeon: West Bali, MD;  [facility]: [facility];  Service: Endoscopy;;  gastric  bx's and gastric polyp   COLONOSCOPY WITH PROPOFOL N/A 03/28/2017   Dr. Darrick Penna: Few small and large mouth diverticula in the rectosigmoid colon, sigmoid colon, descending colon.  External and internal hemorrhoids noted.  One descending colon polyp removed, pathology benign.  Return colonoscopy in 10 years.   ESOPHAGEAL MANOMETRY N/A 10/10/2018   Procedure: ESOPHAGEAL MANOMETRY (EM);  Surgeon: Napoleon Form, MD;  Location: [facility];  Service: Endoscopy;  Laterality: N/A;   ESOPHAGOGASTRODUODENOSCOPY (EGD) WITH PROPOFOL N/A 03/28/2017   Dr. Darrick Penna: Patchy mild inflammation and erythema in the gastric antrum, negative H. pylori on biopsies.  On biopsy he had chronic gastritis, negative for dysplasia.     ESOPHAGOGASTRODUODENOSCOPY (EGD) WITH PROPOFOL N/A 09/24/2018   Procedure: ESOPHAGOGASTRODUODENOSCOPY (EGD) WITH PROPOFOL;  Surgeon: Corbin Ade, MD;  Location: [facility];  Service: Endoscopy;  Laterality: N/A;  2:45pm   KNEE ARTHROSCOPY  2016   rt knee   PH IMPEDANCE STUDY N/A 10/10/2018   Procedure: PH IMPEDANCE STUDY;  Surgeon: Napoleon Form, MD;  Location: [facility];  Service: Endoscopy;  Laterality: N/A;   POLYPECTOMY  03/28/2017   Procedure: POLYPECTOMY;  Surgeon: West Bali, MD;  Location: [facility];  Service: Endoscopy;;  descending colon polyp   TOTAL KNEE ARTHROPLASTY Right 10/29/2014   Procedure: RIGHT TOTAL KNEE ARTHROPLASTY;  Surgeon: Ranee Gosselin, MD;  Location: [facility];  Service: Orthopedics;  Laterality: Right;      Social History:     Social History   Tobacco Use   Smoking status: Never   Smokeless tobacco: Never  Substance Use Topics   Alcohol use: No    Alcohol/week: 0.0 standard drinks of alcohol        Family History :     Family History  Problem Relation Age of Onset   Lung disease Mother    Colon cancer Neg Hx       Home Medications:   Prior to Admission medications   Medication Sig Start Date End Date Taking? Authorizing  Provider  Azelastine HCl 137 MCG/SPRAY SOLN Place 2 sprays into both nostrils 2 (two) times daily as needed. 01/27/23 05/18/23 Yes Alfonse Spruce, MD  citalopram (CELEXA) 40 MG tablet Take 40 mg by mouth daily.   Yes [provider]  clonazePAM (KLONOPIN) 0.5 MG tablet Take 0.5 mg by mouth 3 (three) times daily as needed for anxiety. anxiety 02/10/14  Yes [provider]  EPINEPHrine 0.3 mg/0.3 mL IJ SOAJ injection Inject 0.3 mg into the muscle as needed for anaphylaxis. USE AS DIRECTED FOR A SEVERE ALLERGIC REACTION. 03/11/22  Yes Alfonse Spruce, MD  hydrochlorothiazide (HYDRODIURIL) 25 MG tablet Take 25 mg by mouth every morning.  11/25/13  Yes [provider]  ibuprofen (ADVIL) 200 MG tablet Take 400 mg by mouth every 6 (six) hours as needed for mild pain (pain score 1-3) or headache.   Yes [provider]  ipratropium (ATROVENT) 0.06 % nasal spray Place 2 sprays into both nostrils 2 (two) times daily as needed for rhinitis. 01/27/23  Yes Alfonse Spruce, MD  levocetirizine (XYZAL) 5 MG tablet Take 1 tablet (5 mg total) by mouth every evening. 01/27/23  Yes Alfonse Spruce, MD  mometasone (NASONEX) 50 MCG/ACT nasal spray Place 2 sprays into the nose daily as needed. 01/27/23  Yes Alfonse Spruce, MD  simvastatin (ZOCOR) 20 MG tablet Take 20 mg by mouth at bedtime.  11/12/13  Yes [provider]     Allergies:    No Known Allergies   Physical Exam:   Vitals  Blood pressure (!) 136/96, pulse (!) 106, temperature 98.1 F (36.7 C), temperature source Oral, resp. rate 18, height 5' 1.22" (1.555 m), weight 86 kg, SpO2 95%.   1. General Developed male, laying in bed, no apparent distress  2. Normal affect and insight, Not Suicidal or Homicidal, Awake Alert, Oriented X 3.  3. No F.N deficits, ALL C.Nerves Intact, Strength 5/5 all 4 extremities, Sensation intact all 4 extremities, Plantars down going.  4. Ears and Eyes  appear Normal, Conjunctivae clear, PERRLA. Moist Oral Mucosa.  5. Supple Neck, No JVD, No cervical lymphadenopathy appriciated, No Carotid Bruits.  6. Symmetrical Chest wall movement, Good air movement bilaterally, CTAB.  7. RRR, No Gallops, Rubs or Murmurs, No Parasternal Heave.  8. Positive Bowel Sounds, Abdomen Soft, tenderness to palpation in epigastric area, No  organomegaly appriciated,No rebound -guarding or rigidity.  9.  No Cyanosis, Normal Skin Turgor, No Skin Rash or Bruise.  10. Good muscle tone,  joints appear normal , no effusions, Normal ROM.    Data Review:    CBC Recent Labs  Lab 05/18/23 1431  WBC 16.8*  HGB 18.9*  HCT 52.6*  PLT 269  MCV 86.4  MCH 31.0  MCHC 35.9  RDW 13.4   ------------------------------------------------------------------------------------------------------------------  Chemistries  Recent Labs  Lab 05/18/23 1431 05/18/23 1649  NA 131* 131*  K 3.5 3.8  CL 94* 94*  CO2 18* 23  GLUCOSE 116* 107*  BUN 26* 22  CREATININE 1.25* 1.07  CALCIUM 8.8* 8.4*  MG 1.9  --   AST 70*  --   ALT 38  --   ALKPHOS 66  --   BILITOT 1.4*  --    ------------------------------------------------------------------------------------------------------------------ estimated creatinine clearance is 66.9 mL/min (by C-G formula based on SCr of 1.07 mg/dL). ------------------------------------------------------------------------------------------------------------------ No results for input(s): "TSH", "T4TOTAL", "T3FREE", "THYROIDAB" in the last 72 hours.  Invalid input(s): "FREET3"  Coagulation profile No results for input(s): "INR", "PROTIME" in the last 168 hours. ------------------------------------------------------------------------------------------------------------------- No results for input(s): "DDIMER" in the last 72  hours. -------------------------------------------------------------------------------------------------------------------  Cardiac Enzymes No results for input(s): "CKMB", "TROPONINI", "MYOGLOBIN" in the last 168 hours.  Invalid input(s): "CK" ------------------------------------------------------------------------------------------------------------------ No results found for: "BNP"   ---------------------------------------------------------------------------------------------------------------  Urinalysis    Component Value Date/Time   COLORURINE AMBER (A) 05/18/2023 1350   APPEARANCEUR HAZY (A) 05/18/2023 1350   APPEARANCEUR Clear 12/01/2020 1346   LABSPEC 1.024 05/18/2023 1350   PHURINE 5.0 05/18/2023 1350   GLUCOSEU NEGATIVE 05/18/2023 1350   HGBUR NEGATIVE 05/18/2023 1350   BILIRUBINUR NEGATIVE 05/18/2023 1350   BILIRUBINUR Negative 12/01/2020 1346   KETONESUR NEGATIVE 05/18/2023 1350   PROTEINUR 100 (A) 05/18/2023 1350   UROBILINOGEN 0.2 10/21/2014 1430   NITRITE NEGATIVE 05/18/2023 1350   LEUKOCYTESUR NEGATIVE 05/18/2023 1350    ----------------------------------------------------------------------------------------------------------------   Imaging Results:    CT ABDOMEN PELVIS W CONTRAST Result Date: 05/18/2023 CLINICAL DATA:  Epi gastric pain with nausea vomiting and diarrhea EXAM: CT ABDOMEN AND PELVIS WITH CONTRAST TECHNIQUE: Multidetector CT imaging of the abdomen and pelvis was performed using the standard protocol following bolus administration of intravenous contrast. RADIATION DOSE REDUCTION: This exam was performed according to the departmental dose-optimization program which includes automated exposure control, adjustment of the mA and/or kV according to patient size and/or use of iterative reconstruction technique. CONTRAST:  OMNIPAQUE IOHEXOL 300 MG/ML  SOLN COMPARISON:  CT 07/18/2006 FINDINGS: Lower chest: Lung bases demonstrate no acute airspace  disease. Small hiatal hernia Hepatobiliary: No focal liver abnormality is seen. No gallstones, gallbladder wall thickening, or biliary dilatation. Pancreas: Unremarkable. No pancreatic ductal dilatation or surrounding inflammatory changes. Spleen: Normal in size without focal abnormality. Adrenals/Urinary Tract: Adrenal glands are unremarkable. Kidneys are normal, without renal calculi, focal lesion, or hydronephrosis. Bladder is unremarkable. Stomach/Bowel: Moderate distension of the stomach. Borderline jejunal distension but with prominent jejunal and proximal ileal small bowel wall thickening and mucosal enhancement. Associated mild mesenteric stranding and congestion. No convincing evidence for obstruction. Negative appendix. Diverticular disease of the left colon Vascular/Lymphatic: Aortic atherosclerosis. No enlarged abdominal or pelvic lymph nodes. Reproductive: Prostate is unremarkable. Other: No free air.  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 Multilevel degenerative changes IMPRESSION: 1. Prominent wall thickening and mucosal enhancement of jejunal and proximal ileal small  bowel loops in the left upper quadrant with associated mesenteric congestion. Suspect infectious or inflammatory enteritis. Bowel is borderline dilated but no convincing obstruction on this exam. 2. Small volume free fluid in the upper abdomen but no free air 3. Diverticular disease of the left colon without acute inflammatory process. 4. Aortic atherosclerosis. Electronically Signed   By: Jasmine Pang M.D.   On: 05/18/2023 16:25   DG Chest 2 View Result Date: 05/18/2023 CLINICAL DATA:  Shortness of breath. EXAM: CHEST - 2 VIEW COMPARISON:  Chest radiograph dated 02/13/2014. FINDINGS: The heart size and mediastinal contours are within normal limits. No focal consolidation, pleural effusion, or pneumothorax. No acute osseous abnormality.  IMPRESSION: No acute cardiopulmonary findings. Electronically Signed   By: Hart Robinsons M.D.   On: 05/18/2023 15:14     EKG:  Vent. rate 142 BPM PR interval 138 ms QRS duration 80 ms QT/QTcB 292/449 ms P-R-T axes 29 161 11 Sinus tachycardia Right axis deviation Nonspecific T wave abnormality Abnormal ECG   Assessment & Plan:    Principal Problem:   Intractable nausea and vomiting Active Problems:   Gastroesophageal reflux disease    Intractable nausea and vomiting Jejunitis Diarrhea -Presents with intractable nausea and vomiting, clinically dehydrated, CT abdomen pelvis significant for jejunitis. -This is most likely due to infectious etiology, will check GI panel -Will start empirically on IV Rocephin and Flagyl, will check procalcitonin, and if negative will DC antibiotics -Continue with as needed Zofran and pain medications -Clinically dehydrated, continue with IV fluids   Leukocytosis -due to above, UA, follow on procalcitonin  Hyponatremia Dehydration -Due to volume depletion, continue with IV fluids  Polycythemia -Hemoglobin significantly elevated at 18.9, this is most likely due to dehydration, continue with IV fluid, recheck CBC in a.m.  Hypertension -Continue with hydrochlorothiazide, will add as needed hydralazine  Hyperlipidemia -Continue with home dose statin  Anxiety/depression -Continue with home Celexa and Klonopin  Obesity class II Body mass index is 35.57 kg/m.   DVT Prophylaxis Heparin   AM Labs Ordered, also please review Full Orders  Family Communication: Admission, patients condition and plan of care including tests being ordered have been discussed with the patient who indicate understanding and agree with the plan and Code Status.  Code Status full  Likely DC to  home  Condition GUARDED    Consults called: none    Admission status: observation    Time spent in minutes : 60 minutes   Huey Bienenstock M.D on  05/18/2023 at 9:17 PM   [facility] - Office  [phone]

## 2023-05-18 NOTE — ED Notes (Signed)
 ED Provider at bedside.

## 2023-05-18 NOTE — Care Management Obs Status (Signed)
 MEDICARE OBSERVATION STATUS NOTIFICATION   Patient Details  Name: [name] Harper MRN: [id number] Date of Birth: [birth year]   Medicare Observation Status Notification Given:  Yes    Barron Alvine, RN 05/18/2023, 10:44 PM

## 2023-05-18 NOTE — ED Provider Notes (Signed)
 [place] EMERGENCY DEPARTMENT AT [facility] Provider Note   CSN: [id number] Arrival date & time: 05/18/23  1326     History  Chief Complaint  Patient presents with   Emesis    [name] Harper is a 63 y.o. male.  Patient is a 63 year old male who presents emergency department the chief complaint of nausea, vomiting, diarrhea, abdominal cramping which has been ongoing since last night.  Patient notes that he has had continuous symptoms and has been unable to tolerate much p.o. intake.  He denies any associated melena, hematochezia, hematemesis.  He denies any fever or chills.  He denies any known sick contacts.  He does note that he had some shortness of breath this morning but has had no associated chest pain.  He denies any dizziness, lightheadedness or syncope.  He admits to generalized weakness.   Emesis Associated symptoms: abdominal pain and diarrhea        Home Medications Prior to Admission medications   Medication Sig Start Date End Date Taking? Authorizing Provider  Azelastine HCl 137 MCG/SPRAY SOLN Place 2 sprays into both nostrils 2 (two) times daily as needed. 01/27/23 02/26/23  Alfonse Spruce, MD  citalopram (CELEXA) 40 MG tablet Take 40 mg by mouth daily.    [provider]  clonazePAM (KLONOPIN) 0.5 MG tablet Take 0.5 mg by mouth 3 (three) times daily as needed for anxiety. anxiety 02/10/14   [provider]  EPINEPHrine 0.3 mg/0.3 mL IJ SOAJ injection Inject 0.3 mg into the muscle as needed for anaphylaxis. USE AS DIRECTED FOR A SEVERE ALLERGIC REACTION. 03/11/22   Alfonse Spruce, MD  hydrochlorothiazide (HYDRODIURIL) 25 MG tablet Take 25 mg by mouth every morning.  11/25/13   [provider]  ipratropium (ATROVENT) 0.06 % nasal spray Place 2 sprays into both nostrils 2 (two) times daily as needed for rhinitis. 01/27/23   Alfonse Spruce, MD  levocetirizine (XYZAL) 5 MG tablet Take 1 tablet (5 mg total) by mouth  every evening. 01/27/23   Alfonse Spruce, MD  mometasone (NASONEX) 50 MCG/ACT nasal spray Place 2 sprays into the nose daily as needed. 01/27/23   Alfonse Spruce, MD  simvastatin (ZOCOR) 20 MG tablet Take 20 mg by mouth at bedtime.  11/12/13   [provider]      Allergies    Patient has no known allergies.    Review of Systems   Review of Systems  Gastrointestinal:  Positive for abdominal pain, diarrhea, nausea and vomiting.  All other systems reviewed and are negative.   Physical Exam Updated Vital Signs BP (!) 144/101 (BP Location: Right Arm)   Pulse (!) 141   Temp 98 F (36.7 C) (Temporal)   Resp 18   Ht 5' 1.22" (1.555 m)   Wt 86 kg   SpO2 96%   BMI 35.57 kg/m  Physical Exam Vitals and nursing note reviewed.  Constitutional:      Appearance: Normal appearance.  HENT:     Head: Normocephalic and atraumatic.     Nose: Nose normal.     Mouth/Throat:     Mouth: Mucous membranes are moist.  Eyes:     Extraocular Movements: Extraocular movements intact.     Conjunctiva/sclera: Conjunctivae normal.     Pupils: Pupils are equal, round, and reactive to light.  Cardiovascular:     Rate and Rhythm: Normal rate and regular rhythm.     Pulses: Normal pulses.     Heart sounds:  Normal heart sounds. No murmur heard.    No gallop.  Pulmonary:     Effort: Pulmonary effort is normal. No respiratory distress.     Breath sounds: Normal breath sounds. No stridor. No wheezing or rales.  Abdominal:     General: Abdomen is flat. Bowel sounds are normal. There is no distension.     Palpations: Abdomen is soft.     Tenderness: There is no guarding.     Comments: Tenderness to palpation to epigastric region  Musculoskeletal:        General: Normal range of motion.     Cervical back: Normal range of motion and neck supple.  Skin:    General: Skin is warm and dry.  Neurological:     General: No focal deficit present.     Mental Status: He is alert and oriented  to person, place, and time. Mental status is at baseline.  Psychiatric:        Mood and Affect: Mood normal.        Behavior: Behavior normal.        Thought Content: Thought content normal.        Judgment: Judgment normal.     ED Results / Procedures / Treatments   Labs (all labs ordered are listed, but only abnormal results are displayed) Labs Reviewed  URINALYSIS, ROUTINE W REFLEX MICROSCOPIC  LIPASE, BLOOD  COMPREHENSIVE METABOLIC PANEL WITH GFR  CBC  MAGNESIUM  TROPONIN I (HIGH SENSITIVITY)    EKG None  Radiology No results found.  Procedures Procedures    Medications Ordered in ED Medications  sodium chloride 0.9 % bolus 1,000 mL (has no administration in time range)  ondansetron (ZOFRAN) injection 4 mg (has no administration in time range)  dicyclomine (BENTYL) injection 20 mg (has no administration in time range)    ED Course/ Medical Decision Making/ A&P                                 Medical Decision Making Patient does remain stable at this time.  He did have recurrence of nausea and vomiting in the emergency department.  He was evaluated by attending physician and droperidol was ordered.  Will reevaluate after medication and dispo will be determined at that time.  He has remained tachycardic despite treatment in the emergency department as well.  Will sign patient out to Dr Jarold Motto at [place] on 05/18/23  Amount and/or Complexity of Data Reviewed Labs: ordered. Radiology: ordered.  Risk OTC drugs. Prescription drug management.           Final Clinical Impression(s) / ED Diagnoses Final diagnoses:  None    Rx / DC Orders ED Discharge Orders     None         Kathlen Mody 05/18/23 1916    Durwin Glaze, MD 05/19/23 [phone]

## 2023-05-18 NOTE — ED Provider Notes (Signed)
  Physical Exam  BP (!) 136/96   Pulse (!) 106   Temp 98.1 F (36.7 C) (Oral)   Resp 18   Ht 5' 1.22" (1.555 m)   Wt 86 kg   SpO2 95%   BMI 35.57 kg/m   Physical Exam  Procedures  Procedures  ED Course / MDM   Clinical Course as of 05/18/23 2105  Thu May 18, 2023  2059 Assumed primary care of the patient.  62 year old male with a history of hiatal hernia repair who presents to the emergency department with abdominal swelling, nausea, vomiting, and diarrhea.  CT scan did not show signs of bowel obstruction or ileus.  Patient has had multiple rounds of antiemetics but had vomiting unable to be controlled.  [RP]  2103 Dw Dr Lurlean Horns from hospitalist to admit for intractable nausea and vomiting.  [RP]    Clinical Course User Index [RP] Rondel Baton, MD   Medical Decision Making Amount and/or Complexity of Data Reviewed Labs: ordered. Radiology: ordered.  Risk OTC drugs. Prescription drug management. Decision regarding hospitalization.      Rondel Baton, MD 05/18/23 2105

## 2023-05-19 DIAGNOSIS — R112 Nausea with vomiting, unspecified: Secondary | ICD-10-CM | POA: Diagnosis not present

## 2023-05-19 DIAGNOSIS — E66812 Obesity, class 2: Secondary | ICD-10-CM | POA: Diagnosis present

## 2023-05-19 DIAGNOSIS — E7800 Pure hypercholesterolemia, unspecified: Secondary | ICD-10-CM | POA: Diagnosis present

## 2023-05-19 DIAGNOSIS — E872 Acidosis, unspecified: Secondary | ICD-10-CM | POA: Diagnosis present

## 2023-05-19 DIAGNOSIS — F32A Depression, unspecified: Secondary | ICD-10-CM | POA: Diagnosis present

## 2023-05-19 DIAGNOSIS — I10 Essential (primary) hypertension: Secondary | ICD-10-CM | POA: Diagnosis present

## 2023-05-19 DIAGNOSIS — D649 Anemia, unspecified: Secondary | ICD-10-CM | POA: Diagnosis present

## 2023-05-19 DIAGNOSIS — R7303 Prediabetes: Secondary | ICD-10-CM | POA: Diagnosis present

## 2023-05-19 DIAGNOSIS — Z860100 Personal history of colon polyps, unspecified: Secondary | ICD-10-CM | POA: Diagnosis not present

## 2023-05-19 DIAGNOSIS — A044 Other intestinal Escherichia coli infections: Secondary | ICD-10-CM | POA: Diagnosis present

## 2023-05-19 DIAGNOSIS — K219 Gastro-esophageal reflux disease without esophagitis: Secondary | ICD-10-CM | POA: Diagnosis present

## 2023-05-19 DIAGNOSIS — Z6835 Body mass index (BMI) 35.0-35.9, adult: Secondary | ICD-10-CM | POA: Diagnosis not present

## 2023-05-19 DIAGNOSIS — D751 Secondary polycythemia: Secondary | ICD-10-CM | POA: Diagnosis present

## 2023-05-19 DIAGNOSIS — K2980 Duodenitis without bleeding: Secondary | ICD-10-CM | POA: Diagnosis present

## 2023-05-19 DIAGNOSIS — Z96651 Presence of right artificial knee joint: Secondary | ICD-10-CM | POA: Diagnosis present

## 2023-05-19 DIAGNOSIS — K2970 Gastritis, unspecified, without bleeding: Secondary | ICD-10-CM | POA: Diagnosis present

## 2023-05-19 DIAGNOSIS — K529 Noninfective gastroenteritis and colitis, unspecified: Secondary | ICD-10-CM | POA: Diagnosis present

## 2023-05-19 DIAGNOSIS — Z1152 Encounter for screening for COVID-19: Secondary | ICD-10-CM | POA: Diagnosis not present

## 2023-05-19 DIAGNOSIS — E860 Dehydration: Secondary | ICD-10-CM | POA: Diagnosis present

## 2023-05-19 DIAGNOSIS — Z79899 Other long term (current) drug therapy: Secondary | ICD-10-CM | POA: Diagnosis not present

## 2023-05-19 DIAGNOSIS — F419 Anxiety disorder, unspecified: Secondary | ICD-10-CM | POA: Diagnosis present

## 2023-05-19 DIAGNOSIS — E871 Hypo-osmolality and hyponatremia: Secondary | ICD-10-CM | POA: Diagnosis present

## 2023-05-19 LAB — BASIC METABOLIC PANEL WITH GFR
Anion gap: 10 (ref 5–15)
BUN: 19 mg/dL (ref 8–23)
CO2: 25 mmol/L (ref 22–32)
Calcium: 8.3 mg/dL — ABNORMAL LOW (ref 8.9–10.3)
Chloride: 97 mmol/L — ABNORMAL LOW (ref 98–111)
Creatinine, Ser: 0.82 mg/dL (ref 0.61–1.24)
GFR, Estimated: >60 mL/min (ref >60)
Glucose, Bld: 141 mg/dL — ABNORMAL HIGH (ref 70–99)
Potassium: 3 mmol/L — ABNORMAL LOW (ref 3.5–5.1)
Sodium: 132 mmol/L — ABNORMAL LOW (ref 135–145)

## 2023-05-19 LAB — CBC
HCT: 49.4 % (ref 39.0–52.0)
Hemoglobin: 17.4 g/dL — ABNORMAL HIGH (ref 13.0–17.0)
MCH: 30.7 pg (ref 26.0–34.0)
MCHC: 35.2 g/dL (ref 30.0–36.0)
MCV: 87.3 fL (ref 80.0–100.0)
Platelets: 219 10*3/uL (ref 150–400)
RBC: 5.66 MIL/uL (ref 4.22–5.81)
RDW: 13.4 % (ref 11.5–15.5)
WBC: 12.9 10*3/uL — ABNORMAL HIGH (ref 4.0–10.5)
nRBC: 0 % (ref 0.0–0.2)

## 2023-05-19 LAB — HIV ANTIBODY (ROUTINE TESTING W REFLEX): HIV Screen 4th Generation wRfx: NONREACTIVE

## 2023-05-19 LAB — PROCALCITONIN: Procalcitonin: <0.1 ng/mL

## 2023-05-19 MED ORDER — LACTATED RINGERS IV SOLN: INTRAVENOUS | Status: AC

## 2023-05-19 MED ORDER — POTASSIUM CHLORIDE CRYS ER 20 MEQ PO TBCR
20.0000 meq | EXTENDED_RELEASE_TABLET | Freq: Three times a day (TID) | ORAL | Status: AC
Start: 2023-05-19 — End: 2023-05-19
  Administered 2023-05-19 (×3): 20 meq via ORAL
  Filled 2023-05-19 (×3): qty 1

## 2023-05-19 MED ORDER — LACTATED RINGERS IV SOLN: INTRAVENOUS | Status: DC

## 2023-05-19 MED ORDER — ALUM & MAG HYDROXIDE-SIMETH 200-200-20 MG/5ML PO SUSP: 30.0000 mL | Freq: Four times a day (QID) | ORAL | Status: DC | PRN

## 2023-05-19 NOTE — Plan of Care (Signed)
  Problem: Education: Goal: Knowledge of General Education information will improve Description: Including pain rating scale, medication(s)/side effects and non-pharmacologic comfort measures Outcome: Progressing   Problem: Health Behavior/Discharge Planning: Goal: Ability to manage health-related needs will improve Outcome: Progressing   Problem: Clinical Measurements: Goal: Ability to maintain clinical measurements within normal limits will improve Outcome: Progressing Goal: Will remain free from infection Outcome: Progressing Goal: Diagnostic test results will improve Outcome: Progressing Goal: Cardiovascular complication will be avoided Outcome: Progressing   Problem: Activity: Goal: Risk for activity intolerance will decrease Outcome: Progressing   Problem: Coping: Goal: Level of anxiety will decrease Outcome: Progressing   Problem: Pain Managment: Goal: General experience of comfort will improve and/or be controlled Outcome: Progressing   Problem: Safety: Goal: Ability to remain free from injury will improve Outcome: Progressing   Problem: Skin Integrity: Goal: Risk for impaired skin integrity will decrease Outcome: Progressing

## 2023-05-19 NOTE — Progress Notes (Signed)
 Pts HR lingering in 120's. Charge nurse and on-call MD notified. LR was increased to 100 ml/hr. Will continue to monitor for further HR increases or signs of decline.

## 2023-05-19 NOTE — Plan of Care (Signed)
  Problem: Education: Goal: Knowledge of General Education information will improve Description: Including pain rating scale, medication(s)/side effects and non-pharmacologic comfort measures Outcome: Progressing   Problem: Health Behavior/Discharge Planning: Goal: Ability to manage health-related needs will improve Outcome: Progressing   Problem: Clinical Measurements: Goal: Ability to maintain clinical measurements within normal limits will improve Outcome: Progressing Goal: Will remain free from infection Outcome: Progressing Goal: Diagnostic test results will improve Outcome: Progressing   Problem: Activity: Goal: Risk for activity intolerance will decrease Outcome: Progressing   Problem: Coping: Goal: Level of anxiety will decrease Outcome: Progressing   Problem: Pain Managment: Goal: General experience of comfort will improve and/or be controlled Outcome: Progressing   Problem: Safety: Goal: Ability to remain free from injury will improve Outcome: Progressing   Problem: Skin Integrity: Goal: Risk for impaired skin integrity will decrease Outcome: Progressing

## 2023-05-19 NOTE — Progress Notes (Signed)
 Pts HR improved. Trended in the 107's-110. Pt complained of nausea. Gave zofran. Pt cont'd to exp periodic episodes of nausea and vomiting. Checked pts v/s, v/s within range.

## 2023-05-19 NOTE — Hospital Course (Signed)
 [name] Harper  is a 63 y.o. male, with past medical history of hypertension, anxiety/depression, hyperlipidemia, prediabetes, patient presents to ED secondary to complaints of nausea and vomiting, reports symptoms started yesterday at 7 PM, has been ongoing since, had multiple episodes of nausea and vomiting, no coffee-ground emesis, he reports diarrhea, but no melena, denies any sick contacts. -In ED workup significant for sodium 131, white blood cell count elevated at 16K, anemia with hemoglobin of 18.9, initially creatinine 1.25, but improved with hydration, repeat at 1.07, he had anion gap acidosis with bicarb of 18, anion gap of 19, but this has improved after IV hydration, but he remains with significant nausea and vomiting, CT abdomen was obtained significant for jejunitis,        Assessment & Plan:     Principal Problem:   Intractable nausea and vomiting Active Problems:   Gastroesophageal reflux disease       Intractable nausea and vomiting Jejunitis Diarrhea -Still having nausea, improved vomiting, poor p.o. intake, improving diarrhea, improving abdominal pain -IV fluid hydration, encouraging oral intake  POA: intractable nausea and vomiting, clinically dehydrated, CT abdomen pelvis significant for jejunitis. -Pending  GI panel -Will continue IV Rocephin and Flagyl, for now -Continue with as needed Zofran and pain medications      Leukocytosis -Likely due to gastritis/duodenitis -Afebrile, leukocytosis improving -Continuing empiric IV antibiotics of Rocephin and Flagyl for today   Hyponatremia with  Dehydration -Due to volume depletion,  -Continue IV fluids, encouraging p.o. intake   Polycythemia -Hemoglobin significantly elevated at 18.9, this is most likely due to dehydration, continue with IV fluid,    Hypertension -Mildly hypotensive, holding hydrochlorothiazide,  -  as needed hydralazine   Hyperlipidemia -Continue with home dose statin    Anxiety/depression -Continue with home Celexa and Klonopin   Obesity class II Body mass index is 35.57 kg/m.

## 2023-05-19 NOTE — Progress Notes (Signed)
 PROGRESS NOTE    Patient: [name] Harper                            PCP: Estanislado Pandy, MD                    DOB: 1960-10-03            DOA: 05/18/2023 [id number]             DOS: 05/19/2023, 8:24 AM   LOS: 0 days   Date of Service: The patient was seen and examined on 05/19/2023  Subjective:   The patient was seen and examined this morning. Hemodynamically stable. Reporting of not having any diarrhea, improved vomiting still having episodes of nausea, with poor p.o. intake No new issues overnight .  Brief Narrative:   Caylan Schifano  is a 63 y.o. male, with past medical history of hypertension, anxiety/depression, hyperlipidemia, prediabetes, patient presents to ED secondary to complaints of nausea and vomiting, reports symptoms started yesterday at 7 PM, has been ongoing since, had multiple episodes of nausea and vomiting, no coffee-ground emesis, he reports diarrhea, but no melena, denies any sick contacts. -In ED workup significant for sodium 131, white blood cell count elevated at 16K, anemia with hemoglobin of 18.9, initially creatinine 1.25, but improved with hydration, repeat at 1.07, he had anion gap acidosis with bicarb of 18, anion gap of 19, but this has improved after IV hydration, but he remains with significant nausea and vomiting, CT abdomen was obtained significant for jejunitis,        Assessment & Plan:     Principal Problem:   Intractable nausea and vomiting Active Problems:   Gastroesophageal reflux disease       Intractable nausea and vomiting Jejunitis Diarrhea -Still having nausea, improved vomiting, poor p.o. intake, improving diarrhea, improving abdominal pain -IV fluid hydration, encouraging oral intake  POA: intractable nausea and vomiting, clinically dehydrated, CT abdomen pelvis significant for jejunitis. -Pending  GI panel -Will continue IV Rocephin and Flagyl, for now -Continue with as needed Zofran and pain medications       Leukocytosis -Likely due to gastritis/duodenitis -Afebrile, leukocytosis improving -Continuing empiric IV antibiotics of Rocephin and Flagyl for today   Hyponatremia with  Dehydration -Due to volume depletion,  -Continue IV fluids, encouraging p.o. intake   Polycythemia -Hemoglobin significantly elevated at 18.9, this is most likely due to dehydration, continue with IV fluid,    Hypertension -Mildly hypotensive, holding hydrochlorothiazide,  -  as needed hydralazine   Hyperlipidemia -Continue with home dose statin   Anxiety/depression -Continue with home Celexa and Klonopin   Obesity class II Body mass index is 35.57 kg/m.   --------------------------------------------------------------------------------------------------------------------------------------- Cultures; -GI panel   ------------------------------------------------------------------------------------------------------------------------------------------------  DVT prophylaxis:  heparin injection 5,000 Units Start: 05/19/23 2200   Code Status:   Code Status: Full Code  Family Communication: No family member present at bedside- -Advance care planning has been discussed.   Admission status:   Status is: Observation The patient remains OBS appropriate and will d/c before 2 midnights.   Disposition: From  - home             Planning for discharge in 1-2 days: to   Procedures:   No admission procedures for hospital encounter.   Antimicrobials:  Anti-infectives (From admission, onward)    Start     Dose/Rate Route Frequency Ordered Stop   05/18/23 2200  cefTRIAXone (ROCEPHIN)  2 g in sodium chloride 0.9 % 100 mL IVPB        2 g 200 mL/hr over 30 Minutes Intravenous Every 24 hours 05/18/23 2116     05/18/23 2200  metroNIDAZOLE (FLAGYL) IVPB 500 mg        500 mg 100 mL/hr over 60 Minutes Intravenous Every 12 hours 05/18/23 2116          Medication:   citalopram  40 mg Oral Daily   heparin   5,000 Units Subcutaneous Q8H   simvastatin  20 mg Oral QHS    acetaminophen **OR** acetaminophen, albuterol, clonazePAM, hydrALAZINE, morphine injection, ondansetron **OR** ondansetron (ZOFRAN) IV   Objective:   Vitals:   05/18/23 2123 05/18/23 2236 05/19/23 0042 05/19/23 0420  BP: (!) 140/89 (!) 127/91 116/89 111/72  Pulse: (!) 120 (!) 110  98  Resp: 17 18    Temp: 98.3 F (36.8 C) 99.4 F (37.4 C) 99.5 F (37.5 C) 98.3 F (36.8 C)  TempSrc: Oral Oral Oral Oral  SpO2: 96% 95% 93% 92%  Weight:  87.5 kg    Height:  5\' 1"  (1.549 m)     No intake or output data in the 24 hours ending 05/19/23 0824 Filed Weights   05/18/23 1357 05/18/23 2236  Weight: 86 kg 87.5 kg     Physical examination:   Constitution:  Alert, cooperative, no distress,  Appears calm and comfortable  Psychiatric:   Normal and stable mood and affect, cognition intact,   HEENT:        Normocephalic, PERRL, otherwise with in Normal limits  Chest:         Chest symmetric Cardio vascular:  S1/S2, RRR, No murmure, No Rubs or Gallops  pulmonary: Clear to auscultation bilaterally, respirations unlabored, negative wheezes / crackles Abdomen: Soft, non-tender, non-distended, bowel sounds,no masses, no organomegaly Muscular skeletal: Limited exam - in bed, able to move all 4 extremities,   Neuro: CNII-XII intact. , normal motor and sensation, reflexes intact  Extremities: No pitting edema lower extremities, +2 pulses  Skin: Dry, warm to touch, negative for any Rashes, No open wounds Wounds: per nursing documentation   ------------------------------------------------------------------------------------------------------------------------------------------    LABs:     Latest Ref Rng & Units 05/19/2023    4:18 AM 05/18/2023    2:31 PM 03/28/2017   10:18 AM  CBC  WBC 4.0 - 10.5 K/uL 12.9  16.8  4.8   Hemoglobin 13.0 - 17.0 g/dL 40.9  81.1  91.4   Hematocrit 39.0 - 52.0 % 49.4  52.6  46.4   Platelets 150 - 400  K/uL 219  269  192       Latest Ref Rng & Units 05/19/2023    4:18 AM 05/18/2023    4:49 PM 05/18/2023    2:31 PM  CMP  Glucose 70 - 99 mg/dL 782  956  213   BUN 8 - 23 mg/dL 19  22  26    Creatinine 0.61 - 1.24 mg/dL 0.86  5.78  4.69   Sodium 135 - 145 mmol/L 132  131  131   Potassium 3.5 - 5.1 mmol/L 3.0  3.8  3.5   Chloride 98 - 111 mmol/L 97  94  94   CO2 22 - 32 mmol/L 25  23  18    Calcium 8.9 - 10.3 mg/dL 8.3  8.4  8.8   Total Protein 6.5 - 8.1 g/dL   8.4   Total Bilirubin 0.0 - 1.2 mg/dL   1.4  Alkaline Phos 38 - 126 U/L   66   AST 15 - 41 U/L   70   ALT 0 - 44 U/L   38        Micro Results No results found for this or any previous visit (from the past 240 hours).  Radiology Reports CT ABDOMEN PELVIS W CONTRAST Result Date: 05/18/2023 CLINICAL DATA:  Epi gastric pain with nausea vomiting and diarrhea EXAM: CT ABDOMEN AND PELVIS WITH CONTRAST TECHNIQUE: Multidetector CT imaging of the abdomen and pelvis was performed using the standard protocol following bolus administration of intravenous contrast. RADIATION DOSE REDUCTION: This exam was performed according to the departmental dose-optimization program which includes automated exposure control, adjustment of the mA and/or kV according to patient size and/or use of iterative reconstruction technique. CONTRAST:  OMNIPAQUE IOHEXOL 300 MG/ML  SOLN COMPARISON:  CT 07/18/2006 FINDINGS: Lower chest: Lung bases demonstrate no acute airspace disease. Small hiatal hernia Hepatobiliary: No focal liver abnormality is seen. No gallstones, gallbladder wall thickening, or biliary dilatation. Pancreas: Unremarkable. No pancreatic ductal dilatation or surrounding inflammatory changes. Spleen: Normal in size without focal abnormality. Adrenals/Urinary Tract: Adrenal glands are unremarkable. Kidneys are normal, without renal calculi, focal lesion, or hydronephrosis. Bladder is unremarkable. Stomach/Bowel: Moderate distension of the stomach.  Borderline jejunal distension but with prominent jejunal and proximal ileal small bowel wall thickening and mucosal enhancement. Associated mild mesenteric stranding and congestion. No convincing evidence for obstruction. Negative appendix. Diverticular disease of the left colon Vascular/Lymphatic: Aortic atherosclerosis. No enlarged abdominal or pelvic lymph nodes. Reproductive: Prostate is unremarkable. Other: No free air.  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 Multilevel degenerative changes IMPRESSION: 1. Prominent wall thickening and mucosal enhancement of jejunal and proximal ileal small bowel loops in the left upper quadrant with associated mesenteric congestion. Suspect infectious or inflammatory enteritis. Bowel is borderline dilated but no convincing obstruction on this exam. 2. Small volume free fluid in the upper abdomen but no free air 3. Diverticular disease of the left colon without acute inflammatory process. 4. Aortic atherosclerosis. Electronically Signed   By: Jasmine Pang M.D.   On: 05/18/2023 16:25   DG Chest 2 View Result Date: 05/18/2023 CLINICAL DATA:  Shortness of breath. EXAM: CHEST - 2 VIEW COMPARISON:  Chest radiograph dated 02/13/2014. FINDINGS: The heart size and mediastinal contours are within normal limits. No focal consolidation, pleural effusion, or pneumothorax. No acute osseous abnormality. IMPRESSION: No acute cardiopulmonary findings. Electronically Signed   By: Hart Robinsons M.D.   On: 05/18/2023 15:14    SIGNED: Kendell Bane, MD, FHM. FAAFP. [facility] - [facility] Time spent - 55 min.  In seeing, evaluating and examining the patient. Reviewing medical records, labs, drawn plan of care. [facility],  Pager (please use [email or web address] to page/ text) Please use Epic Secure Chat for non-urgent communication (7AM-7PM)  If 7PM-7AM, please contact  night-coverage [email or web address], 05/19/2023, 8:24 AM

## 2023-05-20 DIAGNOSIS — R112 Nausea with vomiting, unspecified: Secondary | ICD-10-CM | POA: Diagnosis not present

## 2023-05-20 LAB — COMPREHENSIVE METABOLIC PANEL WITH GFR
ALT: 49 U/L — ABNORMAL HIGH (ref 0–44)
AST: 149 U/L — ABNORMAL HIGH (ref 15–41)
Albumin: 3.3 g/dL — ABNORMAL LOW (ref 3.5–5.0)
Alkaline Phosphatase: 46 U/L (ref 38–126)
Anion gap: 9 (ref 5–15)
BUN: 13 mg/dL (ref 8–23)
CO2: 28 mmol/L (ref 22–32)
Calcium: 8.2 mg/dL — ABNORMAL LOW (ref 8.9–10.3)
Chloride: 93 mmol/L — ABNORMAL LOW (ref 98–111)
Creatinine, Ser: 0.64 mg/dL (ref 0.61–1.24)
GFR, Estimated: >60 mL/min (ref >60)
Glucose, Bld: 153 mg/dL — ABNORMAL HIGH (ref 70–99)
Potassium: 3.2 mmol/L — ABNORMAL LOW (ref 3.5–5.1)
Sodium: 130 mmol/L — ABNORMAL LOW (ref 135–145)
Total Bilirubin: 1.1 mg/dL (ref 0.0–1.2)
Total Protein: 6.6 g/dL (ref 6.5–8.1)

## 2023-05-20 LAB — CBC
HCT: 42.6 % (ref 39.0–52.0)
Hemoglobin: 14.7 g/dL (ref 13.0–17.0)
MCH: 30.6 pg (ref 26.0–34.0)
MCHC: 34.5 g/dL (ref 30.0–36.0)
MCV: 88.6 fL (ref 80.0–100.0)
Platelets: 174 10*3/uL (ref 150–400)
RBC: 4.81 MIL/uL (ref 4.22–5.81)
RDW: 13.6 % (ref 11.5–15.5)
WBC: 9.5 10*3/uL (ref 4.0–10.5)
nRBC: 0 % (ref 0.0–0.2)

## 2023-05-20 MED ORDER — PROCHLORPERAZINE EDISYLATE 10 MG/2ML IJ SOLN
5.0000 mg | Freq: Four times a day (QID) | INTRAMUSCULAR | Status: DC | PRN
  Administered 2023-05-20: 5 mg via INTRAVENOUS
  Filled 2023-05-20: qty 2

## 2023-05-20 MED ORDER — PROCHLORPERAZINE MALEATE 10 MG PO TABS: 10.0000 mg | ORAL_TABLET | Freq: Four times a day (QID) | ORAL | 0 refills | Status: DC | PRN

## 2023-05-20 MED ORDER — LACTINEX PO CHEW: 1.0000 | CHEWABLE_TABLET | Freq: Three times a day (TID) | ORAL | 0 refills | Status: AC

## 2023-05-20 MED ORDER — AMLODIPINE BESYLATE 5 MG PO TABS: 5.0000 mg | ORAL_TABLET | Freq: Every day | ORAL | 11 refills | Status: AC

## 2023-05-20 MED ORDER — ACETAMINOPHEN 325 MG PO TABS: 650.0000 mg | ORAL_TABLET | Freq: Four times a day (QID) | ORAL | 0 refills | Status: AC | PRN

## 2023-05-20 MED ORDER — POTASSIUM CHLORIDE CRYS ER 20 MEQ PO TBCR
40.0000 meq | EXTENDED_RELEASE_TABLET | Freq: Once | ORAL | Status: AC
  Administered 2023-05-20: 40 meq via ORAL
  Filled 2023-05-20: qty 2

## 2023-05-20 NOTE — Discharge Summary (Signed)
 Physician Discharge Summary   Patient: [name] Harper MRN: [id number] DOB: 12-07-1960  Admit date:     05/18/2023  Discharge date: 05/20/23  Discharge Physician: Kendell Bane   PCP: Estanislado Pandy, MD   Recommendations at discharge:  -Continue oral hydration with Pedialyte or Gatorade 0 -Continue as needed antiemetic meds -Recommending close follow-up with PCP in 1 week, recommending aggressive weight loss -Recommending diabetic diet -BP medication changed from HCTZ to Norvasc  Discharge Diagnoses: Principal Problem:   Intractable nausea and vomiting Active Problems:   Gastroesophageal reflux disease  Resolved Problems:   * No resolved hospital problems. *  Hospital Course: [name] Harper  is a 63 y.o. male, with past medical history of hypertension, anxiety/depression, hyperlipidemia, prediabetes, patient presents to ED secondary to complaints of nausea and vomiting, reports symptoms started yesterday at 7 PM, has been ongoing since, had multiple episodes of nausea and vomiting, no coffee-ground emesis, he reports diarrhea, but no melena, denies any sick contacts. -In ED workup significant for sodium 131, white blood cell count elevated at 16K, anemia with hemoglobin of 18.9, initially creatinine 1.25, but improved with hydration, repeat at 1.07, he had anion gap acidosis with bicarb of 18, anion gap of 19, but this has improved after IV hydration, but he remains with significant nausea and vomiting, CT abdomen was obtained significant for jejunitis,     Intractable nausea and vomiting Jejunitis Diarrhea-1 episode-resolved -Improved nausea vomiting responded well to Compazine (not much to Zofran)  -Status post IV fluid hydration, now tolerating p.o. -Status post treatment with empiric IV antibiotics   POA: intractable nausea and vomiting, clinically dehydrated, CT abdomen pelvis significant for jejunitis. -Pending  GI panel -Will continue IV Rocephin and Flagyl-discontinued  05/20/2023 -Continue as needed Compazine     Leukocytosis-resolved -Likely due to gastritis/duodenitis -Afebrile, leukocytosis improving -Empiric IV antibiotics of Rocephin and Flagyl -discontinued 05/20/2023   Hyponatremia with  Dehydration -Resolved with IVF NS     Polycythemia -Hemoglobin significantly elevated at 18.9, this is most likely due to dehydration,  -Improved with IVF   Hypertension -Discontinue hydrochlorothiazide-due to dehydration, electrolyte abnormalities -Initiated Norvasc 5 mg daily    Hyperlipidemia -Continue with home dose statin   Anxiety/depression -Continue with home Celexa and Klonopin   Obesity class II Body mass index is 35.57 kg/m. -Recommend close follow-up with PCP, and aggressive weight loss  Prediabetic -Recommending weight loss, diabetic diet close follow-up PCP     Disposition: Home Diet recommendation:  Discharge Diet Orders (From admission, onward)     Start     Ordered   05/20/23 0000  Diet - low sodium heart healthy        05/20/23 0906           Carb modified diet DISCHARGE MEDICATION: Allergies as of 05/20/2023   No Known Allergies      Medication List     STOP taking these medications    hydrochlorothiazide 25 MG tablet Commonly known as: HYDRODIURIL   ibuprofen 200 MG tablet Commonly known as: ADVIL       TAKE these medications    acetaminophen 325 MG tablet Commonly known as: TYLENOL Take 2 tablets (650 mg total) by mouth every 6 (six) hours as needed for mild pain (pain score 1-3) (or Fever >/= 101).   amLODipine 5 MG tablet Commonly known as: NORVASC Take 1 tablet (5 mg total) by mouth daily.   Azelastine HCl 137 MCG/SPRAY Soln Place 2 sprays into both nostrils 2 (two)  times daily as needed.   citalopram 40 MG tablet Commonly known as: CELEXA Take 40 mg by mouth daily.   clonazePAM 0.5 MG tablet Commonly known as: KLONOPIN Take 0.5 mg by mouth 3 (three) times daily as needed for  anxiety. anxiety   EPINEPHrine 0.3 mg/0.3 mL Soaj injection Commonly known as: EPI-PEN Inject 0.3 mg into the muscle as needed for anaphylaxis. USE AS DIRECTED FOR A SEVERE ALLERGIC REACTION.   ipratropium 0.06 % nasal spray Commonly known as: ATROVENT Place 2 sprays into both nostrils 2 (two) times daily as needed for rhinitis.   lactobacillus acidophilus & bulgar chewable tablet Chew 1 tablet by mouth 3 (three) times daily with meals for 10 days.   levocetirizine 5 MG tablet Commonly known as: XYZAL Take 1 tablet (5 mg total) by mouth every evening.   mometasone 50 MCG/ACT nasal spray Commonly known as: NASONEX Place 2 sprays into the nose daily as needed.   prochlorperazine 10 MG tablet Commonly known as: COMPAZINE Take 1 tablet (10 mg total) by mouth every 6 (six) hours as needed for up to 10 days for nausea or vomiting.   simvastatin 20 MG tablet Commonly known as: ZOCOR Take 20 mg by mouth at bedtime.        Discharge Exam: Filed Weights   05/18/23 1357 05/18/23 2236  Weight: 86 kg 87.5 kg        General:  AAO x 3,  cooperative, no distress;   HEENT:  Normocephalic, PERRL, otherwise with in Normal limits   Neuro:  CNII-XII intact. , normal motor and sensation, reflexes intact   Lungs:   Clear to auscultation BL, Respirations unlabored,  No wheezes / crackles  Cardio:    S1/S2, RRR, No murmure, No Rubs or Gallops   Abdomen:  Soft, non-tender, bowel sounds active all four quadrants, no guarding or peritoneal signs.  Muscular  skeletal:  Limited exam -global generalized weaknesses - in bed, able to move all 4 extremities,   2+ pulses,  symmetric, No pitting edema  Skin:  Dry, warm to touch, negative for any Rashes,  Wounds: Please see nursing documentation          Condition at discharge: good  The results of significant diagnostics from this hospitalization (including imaging, microbiology, ancillary and laboratory) are listed below for reference.    Imaging Studies: CT ABDOMEN PELVIS W CONTRAST Result Date: 05/18/2023 CLINICAL DATA:  Epi gastric pain with nausea vomiting and diarrhea EXAM: CT ABDOMEN AND PELVIS WITH CONTRAST TECHNIQUE: Multidetector CT imaging of the abdomen and pelvis was performed using the standard protocol following bolus administration of intravenous contrast. RADIATION DOSE REDUCTION: This exam was performed according to the departmental dose-optimization program which includes automated exposure control, adjustment of the mA and/or kV according to patient size and/or use of iterative reconstruction technique. CONTRAST:  OMNIPAQUE IOHEXOL 300 MG/ML  SOLN COMPARISON:  CT 07/18/2006 FINDINGS: Lower chest: Lung bases demonstrate no acute airspace disease. Small hiatal hernia Hepatobiliary: No focal liver abnormality is seen. No gallstones, gallbladder wall thickening, or biliary dilatation. Pancreas: Unremarkable. No pancreatic ductal dilatation or surrounding inflammatory changes. Spleen: Normal in size without focal abnormality. Adrenals/Urinary Tract: Adrenal glands are unremarkable. Kidneys are normal, without renal calculi, focal lesion, or hydronephrosis. Bladder is unremarkable. Stomach/Bowel: Moderate distension of the stomach. Borderline jejunal distension but with prominent jejunal and proximal ileal small bowel wall thickening and mucosal enhancement. Associated mild mesenteric stranding and congestion. No convincing evidence for obstruction. Negative appendix. Diverticular disease  of the left colon Vascular/Lymphatic: Aortic atherosclerosis. No enlarged abdominal or pelvic lymph nodes. Reproductive: Prostate is unremarkable. Other: No free air.  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 Multilevel degenerative changes IMPRESSION: 1. Prominent wall thickening and mucosal enhancement of jejunal and proximal ileal small bowel loops in the  left upper quadrant with associated mesenteric congestion. Suspect infectious or inflammatory enteritis. Bowel is borderline dilated but no convincing obstruction on this exam. 2. Small volume free fluid in the upper abdomen but no free air 3. Diverticular disease of the left colon without acute inflammatory process. 4. Aortic atherosclerosis. Electronically Signed   By: Jasmine Pang M.D.   On: 05/18/2023 16:25   DG Chest 2 View Result Date: 05/18/2023 CLINICAL DATA:  Shortness of breath. EXAM: CHEST - 2 VIEW COMPARISON:  Chest radiograph dated 02/13/2014. FINDINGS: The heart size and mediastinal contours are within normal limits. No focal consolidation, pleural effusion, or pneumothorax. No acute osseous abnormality. IMPRESSION: No acute cardiopulmonary findings. Electronically Signed   By: Hart Robinsons M.D.   On: 05/18/2023 15:14    Microbiology: Results for orders placed or performed during the hospital encounter of 10/10/18  SARS Coronavirus 2 [facility] order, Performed in [facility] hospital lab) Nasopharyngeal Nasopharyngeal Swab     Status: None   Collection Time: 10/10/18  9:42 AM   Specimen: Nasopharyngeal Swab  Result Value Ref Range Status   SARS Coronavirus 2 NEGATIVE NEGATIVE Final    Comment: (NOTE) If result is NEGATIVE SARS-CoV-2 target nucleic acids are NOT DETECTED. The SARS-CoV-2 RNA is generally detectable in upper and lower  respiratory specimens during the acute phase of infection. The lowest  concentration of SARS-CoV-2 viral copies this assay can detect is 250  copies / mL. A negative result does not preclude SARS-CoV-2 infection  and should not be used as the sole basis for treatment or other  patient management decisions.  A negative result may occur with  improper specimen collection / handling, submission of specimen other  than nasopharyngeal swab, presence of viral mutation(s) within the  areas targeted by this assay, and inadequate number of viral copies   (<250 copies / mL). A negative result must be combined with clinical  observations, patient history, and epidemiological information. If result is POSITIVE SARS-CoV-2 target nucleic acids are DETECTED. The SARS-CoV-2 RNA is generally detectable in upper and lower  respiratory specimens dur ing the acute phase of infection.  Positive  results are indicative of active infection with SARS-CoV-2.  Clinical  correlation with patient history and other diagnostic information is  necessary to determine patient infection status.  Positive results do  not rule out bacterial infection or co-infection with other viruses. If result is PRESUMPTIVE POSTIVE SARS-CoV-2 nucleic acids MAY BE PRESENT.   A presumptive positive result was obtained on the submitted specimen  and confirmed on repeat testing.  While 2019 novel coronavirus  (SARS-CoV-2) nucleic acids may be present in the submitted sample  additional confirmatory testing may be necessary for epidemiological  and / or clinical management purposes  to differentiate between  SARS-CoV-2 and other Sarbecovirus currently known to infect humans.  If clinically indicated additional testing with an alternate test  methodology [phone]) is advised. The SARS-CoV-2 RNA is generally  detectable in upper and lower respiratory sp ecimens during the acute  phase of infection. The expected result is Negative. Fact Sheet for Patients:  BoilerBrush.com.cy Fact Sheet for Healthcare Providers: https://pope.com/  This test is not yet approved or cleared by the [place] and has been authorized for detection and/or diagnosis of SARS-CoV-2 by FDA under an Emergency Use Authorization (EUA).  This EUA will remain in effect (meaning this test can be used) for the duration of the COVID-19 declaration under Section 564(b)(1) of the Act, 21 U.S.C. section 360bbb-3(b)(1), unless the authorization is terminated  or revoked sooner. Performed at [facility], [address] [place], [place] [address]     Labs: CBC: Recent Labs  Lab 05/18/23 1431 05/19/23 0418 05/20/23 0442  WBC 16.8* 12.9* 9.5  HGB 18.9* 17.4* 14.7  HCT 52.6* 49.4 42.6  MCV 86.4 87.3 88.6  PLT 269 219 174   Basic Metabolic Panel: Recent Labs  Lab 05/18/23 1431 05/18/23 1649 05/19/23 0418 05/20/23 0442  NA 131* 131* 132* 130*  K 3.5 3.8 3.0* 3.2*  CL 94* 94* 97* 93*  CO2 18* 23 25 28   GLUCOSE 116* 107* 141* 153*  BUN 26* 22 19 13   CREATININE 1.25* 1.07 0.82 0.64  CALCIUM 8.8* 8.4* 8.3* 8.2*  MG 1.9  --   --   --    Liver Function Tests: Recent Labs  Lab 05/18/23 1431 05/20/23 0442  AST 70* 149*  ALT 38 49*  ALKPHOS 66 46  BILITOT 1.4* 1.1  PROT 8.4* 6.6  ALBUMIN 4.5 3.3*   CBG: No results for input(s): "GLUCAP" in the last 168 hours.  Discharge time spent: greater than 30 minutes.  Signed: Kendell Bane, MD [facility] 05/20/2023

## 2023-05-20 NOTE — Progress Notes (Signed)
 Discharge instructions given patient verbalized understanding. IV removed.

## 2023-05-21 LAB — GASTROINTESTINAL PANEL BY PCR, STOOL (REPLACES STOOL CULTURE)

## 2023-06-05 DIAGNOSIS — Z6833 Body mass index (BMI) 33.0-33.9, adult: Secondary | ICD-10-CM | POA: Diagnosis not present

## 2023-06-05 DIAGNOSIS — A084 Viral intestinal infection, unspecified: Secondary | ICD-10-CM | POA: Diagnosis not present

## 2023-06-05 DIAGNOSIS — I10 Essential (primary) hypertension: Secondary | ICD-10-CM | POA: Diagnosis not present

## 2023-06-06 DIAGNOSIS — E1169 Type 2 diabetes mellitus with other specified complication: Secondary | ICD-10-CM | POA: Diagnosis not present

## 2023-06-06 DIAGNOSIS — K529 Noninfective gastroenteritis and colitis, unspecified: Secondary | ICD-10-CM | POA: Diagnosis not present

## 2023-06-06 DIAGNOSIS — Z008 Encounter for other general examination: Secondary | ICD-10-CM | POA: Diagnosis not present

## 2023-06-14 DIAGNOSIS — R7309 Other abnormal glucose: Secondary | ICD-10-CM | POA: Diagnosis not present

## 2023-06-14 DIAGNOSIS — I10 Essential (primary) hypertension: Secondary | ICD-10-CM | POA: Diagnosis not present

## 2023-06-14 DIAGNOSIS — Z6833 Body mass index (BMI) 33.0-33.9, adult: Secondary | ICD-10-CM | POA: Diagnosis not present

## 2023-06-14 DIAGNOSIS — Z0001 Encounter for general adult medical examination with abnormal findings: Secondary | ICD-10-CM | POA: Diagnosis not present

## 2023-06-14 DIAGNOSIS — Z13220 Encounter for screening for lipoid disorders: Secondary | ICD-10-CM | POA: Diagnosis not present

## 2023-06-15 LAB — MISCELLANEOUS TEST

## 2023-06-21 DIAGNOSIS — K529 Noninfective gastroenteritis and colitis, unspecified: Secondary | ICD-10-CM | POA: Diagnosis not present

## 2023-06-21 DIAGNOSIS — E1169 Type 2 diabetes mellitus with other specified complication: Secondary | ICD-10-CM | POA: Diagnosis not present

## 2023-08-08 ENCOUNTER — Emergency Department (HOSPITAL_COMMUNITY)
Admission: EM | Admit: 2023-08-08 | Discharge: 2023-08-08 | Disposition: A | Discharge status: Home / Self Care | Attending: Student | Admitting: Student

## 2023-08-08 ENCOUNTER — Other Ambulatory Visit: Payer: Self-pay

## 2023-08-08 ENCOUNTER — Encounter (HOSPITAL_COMMUNITY): Payer: Self-pay

## 2023-08-08 DIAGNOSIS — F1090 Alcohol use, unspecified, uncomplicated: Secondary | ICD-10-CM | POA: Diagnosis not present

## 2023-08-08 DIAGNOSIS — Z96651 Presence of right artificial knee joint: Secondary | ICD-10-CM | POA: Insufficient documentation

## 2023-08-08 DIAGNOSIS — R112 Nausea with vomiting, unspecified: Secondary | ICD-10-CM | POA: Insufficient documentation

## 2023-08-08 DIAGNOSIS — F1010 Alcohol abuse, uncomplicated: Secondary | ICD-10-CM | POA: Diagnosis not present

## 2023-08-08 DIAGNOSIS — I10 Essential (primary) hypertension: Secondary | ICD-10-CM | POA: Insufficient documentation

## 2023-08-08 DIAGNOSIS — R000 Tachycardia, unspecified: Secondary | ICD-10-CM | POA: Insufficient documentation

## 2023-08-08 DIAGNOSIS — R110 Nausea: Secondary | ICD-10-CM

## 2023-08-08 LAB — CBC WITH DIFFERENTIAL/PLATELET
Abs Immature Granulocytes: 0.05 10*3/uL (ref 0.00–0.07)
Basophils Absolute: 0 10*3/uL (ref 0.0–0.1)
Basophils Relative: 1 %
Eosinophils Absolute: 0 10*3/uL (ref 0.0–0.5)
Eosinophils Relative: 0 %
HCT: 47.4 % (ref 39.0–52.0)
Hemoglobin: 16.5 g/dL (ref 13.0–17.0)
Immature Granulocytes: 1 %
Lymphocytes Relative: 35 %
Lymphs Abs: 2.5 10*3/uL (ref 0.7–4.0)
MCH: 30.8 pg (ref 26.0–34.0)
MCHC: 34.8 g/dL (ref 30.0–36.0)
MCV: 88.4 fL (ref 80.0–100.0)
Monocytes Absolute: 0.5 10*3/uL (ref 0.1–1.0)
Monocytes Relative: 7 %
Neutro Abs: 4.1 10*3/uL (ref 1.7–7.7)
Neutrophils Relative %: 56 %
Platelets: 220 10*3/uL (ref 150–400)
RBC: 5.36 MIL/uL (ref 4.22–5.81)
RDW: 13.6 % (ref 11.5–15.5)
WBC: 7.2 10*3/uL (ref 4.0–10.5)
nRBC: 0 % (ref 0.0–0.2)

## 2023-08-08 LAB — COMPREHENSIVE METABOLIC PANEL WITH GFR
ALT: 61 U/L — ABNORMAL HIGH (ref 0–44)
AST: 65 U/L — ABNORMAL HIGH (ref 15–41)
Albumin: 4.5 g/dL (ref 3.5–5.0)
Alkaline Phosphatase: 75 U/L (ref 38–126)
Anion gap: 16 — ABNORMAL HIGH (ref 5–15)
BUN: 5 mg/dL — ABNORMAL LOW (ref 8–23)
CO2: 23 mmol/L (ref 22–32)
Calcium: 9.4 mg/dL (ref 8.9–10.3)
Chloride: 95 mmol/L — ABNORMAL LOW (ref 98–111)
Creatinine, Ser: 0.63 mg/dL (ref 0.61–1.24)
GFR, Estimated: >60 mL/min (ref >60)
Glucose, Bld: 132 mg/dL — ABNORMAL HIGH (ref 70–99)
Potassium: 3.7 mmol/L (ref 3.5–5.1)
Sodium: 134 mmol/L — ABNORMAL LOW (ref 135–145)
Total Bilirubin: 1.1 mg/dL (ref 0.0–1.2)
Total Protein: 8.4 g/dL — ABNORMAL HIGH (ref 6.5–8.1)

## 2023-08-08 LAB — ETHANOL: Alcohol, Ethyl (B): <15 mg/dL (ref <15)

## 2023-08-08 MED ORDER — ADULT MULTIVITAMIN W/MINERALS CH
1.0000 | ORAL_TABLET | Freq: Every day | ORAL | Status: DC
  Administered 2023-08-08: 1 via ORAL
  Filled 2023-08-08: qty 1

## 2023-08-08 MED ORDER — THIAMINE HCL 100 MG/ML IJ SOLN: 100.0000 mg | Freq: Every day | INTRAMUSCULAR | Status: DC

## 2023-08-08 MED ORDER — ONDANSETRON 4 MG PO TBDP: 4.0000 mg | ORAL_TABLET | Freq: Three times a day (TID) | ORAL | 0 refills | Status: DC | PRN

## 2023-08-08 MED ORDER — LORAZEPAM 2 MG/ML IJ SOLN: 1.0000 mg | INTRAMUSCULAR | Status: DC | PRN

## 2023-08-08 MED ORDER — LORAZEPAM 1 MG PO TABS
1.0000 mg | ORAL_TABLET | ORAL | Status: DC | PRN
  Administered 2023-08-08: 2 mg via ORAL
  Filled 2023-08-08: qty 2

## 2023-08-08 MED ORDER — ONDANSETRON HCL 4 MG/2ML IJ SOLN
4.0000 mg | Freq: Once | INTRAMUSCULAR | Status: AC
  Administered 2023-08-08: 4 mg via INTRAVENOUS
  Filled 2023-08-08: qty 2

## 2023-08-08 MED ORDER — FOLIC ACID 1 MG PO TABS
1.0000 mg | ORAL_TABLET | Freq: Every day | ORAL | Status: DC
  Administered 2023-08-08: 1 mg via ORAL
  Filled 2023-08-08: qty 1

## 2023-08-08 MED ORDER — LACTATED RINGERS IV BOLUS
1000.0000 mL | Freq: Once | INTRAVENOUS | Status: AC
  Administered 2023-08-08: 1000 mL via INTRAVENOUS

## 2023-08-08 MED ORDER — THIAMINE MONONITRATE 100 MG PO TABS
100.0000 mg | ORAL_TABLET | Freq: Every day | ORAL | Status: DC
  Administered 2023-08-08: 100 mg via ORAL
  Filled 2023-08-08: qty 1

## 2023-08-08 NOTE — ED Notes (Signed)
 MD made aware that patient is tolerating eating just fine

## 2023-08-08 NOTE — ED Triage Notes (Signed)
 Pt arrived via POV c/o ETOH withdrawals. Pt reports heavy drinking X 3 days and last drink was around 0530 today. Pt presents shaky and diaphoretic in Triage.

## 2023-08-08 NOTE — ED Notes (Signed)
 Patient ate a [place] sandwich and has tolerated it well with no complaints of N/V

## 2023-08-08 NOTE — ED Provider Notes (Signed)
 [place] EMERGENCY DEPARTMENT AT [facility] Provider Note  CSN: [id number] Arrival date & time: 08/08/23 1046  Chief Complaint(s) Nausea  HPI [name] Harper is a 63 y.o. male with PMH anxiety, depression, HLD, HTN who presents emergency room for evaluation of nausea.  Patient states that his birthday is coming up and he recently had a celebration over the last few days where he was drinking alcohol .  He states he usually does not drink alcohol  and consumes 6-8 beers last night.  Is having persistent nausea and vomiting this afternoon.  Denies associated chest pain, shortness of breath, headache, fever or other systemic symptoms.  Of note, initial chief complaint was for alcohol  withdrawal.  Patient does not drink daily and has never had alcohol  withdrawal before.   Past Medical History Past Medical History:  Diagnosis Date   Anxiety    Arthritis    Bronchitis    Complication of anesthesia    Depression    Hypercholesterolemia    Hypertension    PONV (postoperative nausea and vomiting)    Pre-diabetes    pt stated   Patient Active Problem List   Diagnosis Date Noted   Intractable nausea and vomiting 05/18/2023   Seasonal and perennial allergic rhinitis 07/12/2019   Cough 07/12/2019   Non-intractable vomiting    Fatty liver 01/25/2018   Chronic cough 01/25/2018   Chronic superficial gastritis without bleeding    Polyp of descending colon    Encounter for screening colonoscopy 03/02/2017   Abnormal LFTs 03/02/2017   Nausea without vomiting 03/02/2017   Post-nasal drip 11/29/2016   Perennial allergic rhinitis 11/29/2016   Gastroesophageal reflux disease 11/29/2016   History of total knee arthroplasty 10/29/2014   Home Medication(s) Prior to Admission medications   Medication Sig Start Date End Date Taking? Authorizing Provider  hydrochlorothiazide  (HYDRODIURIL ) 25 MG tablet  06/16/23  Yes [provider]  lisinopril (ZESTRIL) 10 MG tablet  06/21/23   Yes [provider]  ondansetron  (ZOFRAN -ODT) 4 MG disintegrating tablet Take 1 tablet (4 mg total) by mouth every 8 (eight) hours as needed for nausea or vomiting. 08/08/23  Yes Steffie Waggoner, MD  acetaminophen  (TYLENOL ) 325 MG tablet Take 2 tablets (650 mg total) by mouth every 6 (six) hours as needed for mild pain (pain score 1-3) (or Fever >/= 101). 05/20/23   Shahmehdi, Seyed A, MD  amLODipine  (NORVASC ) 5 MG tablet Take 1 tablet (5 mg total) by mouth daily. 05/20/23 05/19/24  ShahmehdiConstantino Demark, MD  Azelastine  HCl 137 MCG/SPRAY SOLN Place 2 sprays into both nostrils 2 (two) times daily as needed. 01/27/23 05/18/23  Rochester Chuck, MD  citalopram  (CELEXA ) 40 MG tablet Take 40 mg by mouth daily.    [provider]  clonazePAM  (KLONOPIN ) 0.5 MG tablet Take 0.5 mg by mouth 3 (three) times daily as needed for anxiety. anxiety 02/10/14   [provider]  EPINEPHrine  0.3 mg/0.3 mL IJ SOAJ injection Inject 0.3 mg into the muscle as needed for anaphylaxis. USE AS DIRECTED FOR A SEVERE ALLERGIC REACTION. 03/11/22   Rochester Chuck, MD  ipratropium (ATROVENT ) 0.06 % nasal spray Place 2 sprays into both nostrils 2 (two) times daily as needed for rhinitis. 01/27/23   Rochester Chuck, MD  levocetirizine (XYZAL ) 5 MG tablet Take 1 tablet (5 mg total) by mouth every evening. 01/27/23   Rochester Chuck, MD  mometasone  (NASONEX ) 50 MCG/ACT nasal spray Place 2 sprays into the nose daily as needed. 01/27/23  Rochester Chuck, MD  prochlorperazine  (COMPAZINE ) 10 MG tablet Take 1 tablet (10 mg total) by mouth every 6 (six) hours as needed for up to 10 days for nausea or vomiting. 05/20/23 05/30/23  Bobbetta Burnet, MD  simvastatin  (ZOCOR ) 20 MG tablet Take 20 mg by mouth at bedtime.  11/12/13   [provider]                                                                                                                                    Past Surgical  History Past Surgical History:  Procedure Laterality Date   ABDOMINAL SURGERY  2007   hiatal hernia repair   BIOPSY  03/28/2017   Procedure: BIOPSY;  Surgeon: Alyce Jubilee, MD;  Location: [facility];  Service: Endoscopy;;  gastric bx's and gastric polyp   COLONOSCOPY WITH PROPOFOL  N/A 03/28/2017   Dr. Nolene Baumgarten: Few small and large mouth diverticula in the rectosigmoid colon, sigmoid colon, descending colon.  External and internal hemorrhoids noted.  One descending colon polyp removed, pathology benign.  Return colonoscopy in 10 years.   ESOPHAGEAL MANOMETRY N/A 10/10/2018   Procedure: ESOPHAGEAL MANOMETRY (EM);  Surgeon: Sergio Dandy, MD;  Location: [facility];  Service: Endoscopy;  Laterality: N/A;   ESOPHAGOGASTRODUODENOSCOPY (EGD) WITH PROPOFOL  N/A 03/28/2017   Dr. Nolene Baumgarten: Patchy mild inflammation and erythema in the gastric antrum, negative H. pylori on biopsies.  On biopsy he had chronic gastritis, negative for dysplasia.     ESOPHAGOGASTRODUODENOSCOPY (EGD) WITH PROPOFOL  N/A 09/24/2018   Procedure: ESOPHAGOGASTRODUODENOSCOPY (EGD) WITH PROPOFOL ;  Surgeon: Suzette Espy, MD;  Location: [facility];  Service: Endoscopy;  Laterality: N/A;  2:45pm   KNEE ARTHROSCOPY  2016   rt knee   PH IMPEDANCE STUDY N/A 10/10/2018   Procedure: PH IMPEDANCE STUDY;  Surgeon: Sergio Dandy, MD;  Location: [facility];  Service: Endoscopy;  Laterality: N/A;   POLYPECTOMY  03/28/2017   Procedure: POLYPECTOMY;  Surgeon: Alyce Jubilee, MD;  Location: [facility];  Service: Endoscopy;;  descending colon polyp   TOTAL KNEE ARTHROPLASTY Right 10/29/2014   Procedure: RIGHT TOTAL KNEE ARTHROPLASTY;  Surgeon: Hazle Lites, MD;  Location: [facility];  Service: Orthopedics;  Laterality: Right;   Family History Family History  Problem Relation Age of Onset   Lung disease Mother    Colon cancer Neg Hx     Social History Social History   Tobacco Use   Smoking status: Never   Smokeless tobacco:  Never  Vaping Use   Vaping status: Never Used  Substance Use Topics   Alcohol  use: Yes    Alcohol /week: 6.0 standard drinks of alcohol     Types: 6 Cans of beer per week   Drug use: No   Allergies Patient has no known allergies.  Review of Systems Review of Systems  Gastrointestinal:  Positive for nausea and vomiting.    Physical Exam Vital  Signs  I have reviewed the triage vital signs BP 130/80 (BP Location: Left Arm)   Pulse 90   Temp 98.2 F (36.8 C) (Oral)   Resp 18   Ht 5' 4 (1.626 m)   Wt 84.4 kg   SpO2 99%   BMI 31.93 kg/m   Physical Exam Constitutional:      General: He is not in acute distress.    Appearance: Normal appearance.  HENT:     Head: Normocephalic and atraumatic.     Nose: No congestion or rhinorrhea.   Eyes:     General:        Right eye: No discharge.        Left eye: No discharge.     Extraocular Movements: Extraocular movements intact.     Pupils: Pupils are equal, round, and reactive to light.    Cardiovascular:     Rate and Rhythm: Regular rhythm. Tachycardia present.     Heart sounds: No murmur heard. Pulmonary:     Effort: No respiratory distress.     Breath sounds: No wheezing or rales.  Abdominal:     General: There is no distension.     Tenderness: There is no abdominal tenderness.   Musculoskeletal:        General: Normal range of motion.     Cervical back: Normal range of motion.   Skin:    General: Skin is warm and dry.   Neurological:     General: No focal deficit present.     Mental Status: He is alert.     ED Results and Treatments Labs (all labs ordered are listed, but only abnormal results are displayed) Labs Reviewed  COMPREHENSIVE METABOLIC PANEL WITH GFR - Abnormal; Notable for the following components:      Result Value   Sodium 134 (*)    Chloride 95 (*)    Glucose, Bld 132 (*)    BUN 5 (*)    Total Protein 8.4 (*)    AST 65 (*)    ALT 61 (*)    Anion gap 16 (*)    All other components  within normal limits  ETHANOL  CBC WITH DIFFERENTIAL/PLATELET                                                                                                                          Radiology No results found.  Pertinent labs & imaging results that were available during my care of the patient were reviewed by me and considered in my medical decision making (see MDM for details).  Medications Ordered in ED Medications  thiamine (VITAMIN B1) tablet 100 mg (100 mg Oral Given 08/08/23 1155)    Or  thiamine (VITAMIN B1) injection 100 mg ( Intravenous See Alternative 08/08/23 1155)  folic acid  (FOLVITE ) tablet 1 mg (1 mg Oral Given 08/08/23 1155)  multivitamin with minerals tablet 1 tablet (1 tablet Oral Given 08/08/23 1155)  lactated ringers  bolus 1,000 mL (0  mLs Intravenous Stopped 08/08/23 1533)  ondansetron  (ZOFRAN ) injection 4 mg (4 mg Intravenous Given 08/08/23 1357)                                                                                                                                     Procedures Procedures  (including critical care time)  Medical Decision Making / ED Course   This patient presents to the ED for concern of nausea and vomiting, this involves an extensive number of treatment options, and is a complaint that carries with it a high risk of complications and morbidity.  The differential diagnosis includes alcoholic gastritis, esophagitis, gastroparesis, pancreatitis gastroenteritis  MDM: Patient seen emergency room for evaluation of nausea and vomiting after alcohol  use.  Physical exam with no significant tenderness in the abdomen, mild tachycardia.  Patient initially arrived with a chief complaint of alcohol  withdrawal and was hypertensive and tachycardic and thus the alcohol  withdrawal order set with CIWA precautions was initiated at triage.  He does received thiamine, folate and Ativan prior to my evaluation.  After further discussion, it appears the patient is  not in alcohol  withdrawal and is having some mild persistent nausea.  Fluids and Zofran  given.  Symptoms significantly improved and patient will tolerate p.o. without difficulty.  At this time he does not inpatient care for me and we discharged with outpatient follow-up.  Alcohol  cessation encouraged.   Additional history obtained: -Additional history obtained from friend -External records from outside source obtained and reviewed including: Chart review including previous notes, labs, imaging, consultation notes   Lab Tests: -I ordered, reviewed, and interpreted labs.   The pertinent results include:   Labs Reviewed  COMPREHENSIVE METABOLIC PANEL WITH GFR - Abnormal; Notable for the following components:      Result Value   Sodium 134 (*)    Chloride 95 (*)    Glucose, Bld 132 (*)    BUN 5 (*)    Total Protein 8.4 (*)    AST 65 (*)    ALT 61 (*)    Anion gap 16 (*)    All other components within normal limits  ETHANOL  CBC WITH DIFFERENTIAL/PLATELET      Medicines ordered and prescription drug management: Meds ordered this encounter  Medications   DISCONTD: LORazepam (ATIVAN) tablet 1-4 mg    CIWA-AR < 5 =:   0 mg    CIWA-AR 5 -10 =:   1 mg    CIWA-AR 11 -15 =:   2 mg    CIWA-AR 16 -20 =:   3 mg    CIWA-AR 16 -20 =:   Recheck CIWA-AR in 1 hour; if > 20 notify MD    CIWA-AR > 20 =:   4 mg    CIWA-AR > 20 =:   Call Rapid Response   DISCONTD: LORazepam (ATIVAN) injection 1-4 mg    CIWA-AR < 5 =:   0  mg    CIWA-AR 5 -10 =:   1 mg    CIWA-AR 11 -15 =:   2 mg    CIWA-AR 16 -20 =:   3 mg    CIWA-AR 16 -20 =:   Recheck CIWA-AR in 1 hour; if > 20 notify MD    CIWA-AR > 20 =:   4 mg    CIWA-AR > 20 =:   Call Rapid Response   OR Linked Order Group    thiamine (VITAMIN B1) tablet 100 mg    thiamine (VITAMIN B1) injection 100 mg   folic acid  (FOLVITE ) tablet 1 mg   multivitamin with minerals tablet 1 tablet   lactated ringers  bolus 1,000 mL   ondansetron  (ZOFRAN ) injection 4  mg   ondansetron  (ZOFRAN -ODT) 4 MG disintegrating tablet    Sig: Take 1 tablet (4 mg total) by mouth every 8 (eight) hours as needed for nausea or vomiting.    Dispense:  20 tablet    Refill:  0    -I have reviewed the patients home medicines and have made adjustments as needed  Critical interventions none   Cardiac Monitoring: The patient was maintained on a cardiac monitor.  I personally viewed and interpreted the cardiac monitored which showed an underlying rhythm of: NSR  Social Determinants of Health:  Factors impacting patients care include: Alcohol  use, cessation encouraged   Reevaluation: After the interventions noted above, I reevaluated the patient and found that they have :improved  Co morbidities that complicate the patient evaluation  Past Medical History:  Diagnosis Date   Anxiety    Arthritis    Bronchitis    Complication of anesthesia    Depression    Hypercholesterolemia    Hypertension    PONV (postoperative nausea and vomiting)    Pre-diabetes    pt stated      Dispostion: I considered admission for this patient, but at this time he does not meet inpatient criteria for admission and will be discharged with outpatient follow-up.     Final Clinical Impression(s) / ED Diagnoses Final diagnoses:  Nausea  Alcohol  use     @PCDICTATION @    Karlyn Overman, MD 08/08/23 [phone]

## 2023-09-08 DIAGNOSIS — E119 Type 2 diabetes mellitus without complications: Secondary | ICD-10-CM | POA: Diagnosis not present

## 2023-09-10 ENCOUNTER — Other Ambulatory Visit: Payer: Self-pay | Admitting: Allergy & Immunology

## 2023-09-14 DIAGNOSIS — F411 Generalized anxiety disorder: Secondary | ICD-10-CM | POA: Diagnosis not present

## 2023-09-14 DIAGNOSIS — R110 Nausea: Secondary | ICD-10-CM | POA: Diagnosis not present

## 2023-09-14 DIAGNOSIS — Z6835 Body mass index (BMI) 35.0-35.9, adult: Secondary | ICD-10-CM | POA: Diagnosis not present

## 2023-09-14 DIAGNOSIS — F3341 Major depressive disorder, recurrent, in partial remission: Secondary | ICD-10-CM | POA: Diagnosis not present

## 2023-09-14 DIAGNOSIS — I10 Essential (primary) hypertension: Secondary | ICD-10-CM | POA: Diagnosis not present

## 2023-09-22 ENCOUNTER — Emergency Department (HOSPITAL_COMMUNITY)
Admission: EM | Admit: 2023-09-22 | Discharge: 2023-09-22 | Disposition: A | Discharge status: Home / Self Care | Attending: Emergency Medicine | Admitting: Emergency Medicine

## 2023-09-22 ENCOUNTER — Encounter (HOSPITAL_COMMUNITY): Payer: Self-pay

## 2023-09-22 ENCOUNTER — Emergency Department (HOSPITAL_COMMUNITY)

## 2023-09-22 DIAGNOSIS — R000 Tachycardia, unspecified: Secondary | ICD-10-CM | POA: Diagnosis not present

## 2023-09-22 DIAGNOSIS — R002 Palpitations: Secondary | ICD-10-CM | POA: Diagnosis not present

## 2023-09-22 DIAGNOSIS — K5730 Diverticulosis of large intestine without perforation or abscess without bleeding: Secondary | ICD-10-CM | POA: Diagnosis not present

## 2023-09-22 DIAGNOSIS — I10 Essential (primary) hypertension: Secondary | ICD-10-CM | POA: Diagnosis not present

## 2023-09-22 DIAGNOSIS — K449 Diaphragmatic hernia without obstruction or gangrene: Secondary | ICD-10-CM | POA: Diagnosis not present

## 2023-09-22 DIAGNOSIS — Z79899 Other long term (current) drug therapy: Secondary | ICD-10-CM | POA: Insufficient documentation

## 2023-09-22 DIAGNOSIS — F419 Anxiety disorder, unspecified: Secondary | ICD-10-CM | POA: Insufficient documentation

## 2023-09-22 DIAGNOSIS — K4090 Unilateral inguinal hernia, without obstruction or gangrene, not specified as recurrent: Secondary | ICD-10-CM | POA: Diagnosis not present

## 2023-09-22 DIAGNOSIS — R748 Abnormal levels of other serum enzymes: Secondary | ICD-10-CM | POA: Insufficient documentation

## 2023-09-22 DIAGNOSIS — R7989 Other specified abnormal findings of blood chemistry: Secondary | ICD-10-CM | POA: Diagnosis not present

## 2023-09-22 DIAGNOSIS — R110 Nausea: Secondary | ICD-10-CM | POA: Insufficient documentation

## 2023-09-22 LAB — CBC WITH DIFFERENTIAL/PLATELET
Abs Immature Granulocytes: 0.02 K/uL (ref 0.00–0.07)
Basophils Absolute: 0 K/uL (ref 0.0–0.1)
Basophils Relative: 0 %
Eosinophils Absolute: 0 K/uL (ref 0.0–0.5)
Eosinophils Relative: 0 %
HCT: 50.2 % (ref 39.0–52.0)
Hemoglobin: 17.8 g/dL — ABNORMAL HIGH (ref 13.0–17.0)
Immature Granulocytes: 0 %
Lymphocytes Relative: 30 %
Lymphs Abs: 2.6 K/uL (ref 0.7–4.0)
MCH: 30.7 pg (ref 26.0–34.0)
MCHC: 35.5 g/dL (ref 30.0–36.0)
MCV: 86.6 fL (ref 80.0–100.0)
Monocytes Absolute: 0.6 K/uL (ref 0.1–1.0)
Monocytes Relative: 7 %
Neutro Abs: 5.4 K/uL (ref 1.7–7.7)
Neutrophils Relative %: 63 %
Platelets: 258 K/uL (ref 150–400)
RBC: 5.8 MIL/uL (ref 4.22–5.81)
RDW: 12.8 % (ref 11.5–15.5)
WBC: 8.6 K/uL (ref 4.0–10.5)
nRBC: 0 % (ref 0.0–0.2)

## 2023-09-22 LAB — URINALYSIS, ROUTINE W REFLEX MICROSCOPIC
Bilirubin Urine: NEGATIVE
Glucose, UA: NEGATIVE mg/dL
Hgb urine dipstick: NEGATIVE
Ketones, ur: NEGATIVE mg/dL
Leukocytes,Ua: NEGATIVE
Nitrite: NEGATIVE
Protein, ur: NEGATIVE mg/dL
Specific Gravity, Urine: 1.014 (ref 1.005–1.030)
pH: 8 (ref 5.0–8.0)

## 2023-09-22 LAB — COMPREHENSIVE METABOLIC PANEL WITH GFR
ALT: 50 U/L — ABNORMAL HIGH (ref 0–44)
AST: 37 U/L (ref 15–41)
Albumin: 4.9 g/dL (ref 3.5–5.0)
Alkaline Phosphatase: 73 U/L (ref 38–126)
Anion gap: 17 — ABNORMAL HIGH (ref 5–15)
BUN: 10 mg/dL (ref 8–23)
CO2: 23 mmol/L (ref 22–32)
Calcium: 9.2 mg/dL (ref 8.9–10.3)
Chloride: 94 mmol/L — ABNORMAL LOW (ref 98–111)
Creatinine, Ser: 0.69 mg/dL (ref 0.61–1.24)
GFR, Estimated: >60 mL/min (ref >60)
Glucose, Bld: 111 mg/dL — ABNORMAL HIGH (ref 70–99)
Potassium: 3.5 mmol/L (ref 3.5–5.1)
Sodium: 134 mmol/L — ABNORMAL LOW (ref 135–145)
Total Bilirubin: 1.5 mg/dL — ABNORMAL HIGH (ref 0.0–1.2)
Total Protein: 8.8 g/dL — ABNORMAL HIGH (ref 6.5–8.1)

## 2023-09-22 LAB — TROPONIN I (HIGH SENSITIVITY): Troponin I (High Sensitivity): 3 ng/L (ref <18)

## 2023-09-22 LAB — TSH: TSH: 0.817 u[IU]/mL (ref 0.350–4.500)

## 2023-09-22 LAB — LIPASE, BLOOD: Lipase: 80 U/L — ABNORMAL HIGH (ref 11–51)

## 2023-09-22 MED ORDER — SODIUM CHLORIDE 0.9 % IV BOLUS
1000.0000 mL | Freq: Once | INTRAVENOUS | Status: AC
  Administered 2023-09-22: 1000 mL via INTRAVENOUS

## 2023-09-22 MED ORDER — ONDANSETRON HCL 4 MG PO TABS
4.0000 mg | ORAL_TABLET | Freq: Three times a day (TID) | ORAL | 0 refills | Status: DC | PRN
Start: 2023-09-22 — End: 2023-10-25

## 2023-09-22 MED ORDER — IOHEXOL 300 MG/ML  SOLN
100.0000 mL | Freq: Once | INTRAMUSCULAR | Status: AC | PRN
  Administered 2023-09-22: 100 mL via INTRAVENOUS

## 2023-09-22 MED ORDER — ONDANSETRON HCL 4 MG/2ML IJ SOLN
4.0000 mg | Freq: Once | INTRAMUSCULAR | Status: AC
  Administered 2023-09-22: 4 mg via INTRAVENOUS
  Filled 2023-09-22: qty 2
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
## 2023-09-22 NOTE — ED Notes (Signed)
 Pt difficult stick for IV.

## 2023-09-22 NOTE — Discharge Instructions (Signed)
 Take your other home medicines as we discussed.  I have prescribed you nausea medicine (zofran ) if your nausea returns.

## 2023-09-22 NOTE — ED Notes (Addendum)
 1528: Made Dr Patsey and PA Mliss Narrow aware that patient having stress, anxiety, shaking diaphoretic, nausea HR 140 ST. Patient requesting something for nausea,   Zofran  IV and NS Bolus ordered.

## 2023-09-22 NOTE — ED Triage Notes (Addendum)
 Pt comes in for nausea for the past week. Pt also has general body shakes. Pt has NOT vomited but is nauseated when he does eat. A&Ox4. Pt wonders if from stress.   ** Pt does speak broke english, will need a Nurse, learning disability.
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
## 2023-09-24 ENCOUNTER — Emergency Department (HOSPITAL_COMMUNITY)
Admission: EM | Admit: 2023-09-24 | Discharge: 2023-09-24 | Disposition: A | Discharge status: Home / Self Care | Attending: Emergency Medicine | Admitting: Emergency Medicine

## 2023-09-24 ENCOUNTER — Emergency Department (HOSPITAL_COMMUNITY)

## 2023-09-24 DIAGNOSIS — K828 Other specified diseases of gallbladder: Secondary | ICD-10-CM | POA: Diagnosis not present

## 2023-09-24 DIAGNOSIS — E878 Other disorders of electrolyte and fluid balance, not elsewhere classified: Secondary | ICD-10-CM | POA: Diagnosis not present

## 2023-09-24 DIAGNOSIS — K2970 Gastritis, unspecified, without bleeding: Secondary | ICD-10-CM | POA: Insufficient documentation

## 2023-09-24 DIAGNOSIS — I10 Essential (primary) hypertension: Secondary | ICD-10-CM | POA: Insufficient documentation

## 2023-09-24 DIAGNOSIS — R112 Nausea with vomiting, unspecified: Secondary | ICD-10-CM | POA: Diagnosis not present

## 2023-09-24 DIAGNOSIS — K449 Diaphragmatic hernia without obstruction or gangrene: Secondary | ICD-10-CM | POA: Diagnosis not present

## 2023-09-24 DIAGNOSIS — Z79899 Other long term (current) drug therapy: Secondary | ICD-10-CM | POA: Diagnosis not present

## 2023-09-24 DIAGNOSIS — E871 Hypo-osmolality and hyponatremia: Secondary | ICD-10-CM | POA: Diagnosis not present

## 2023-09-24 DIAGNOSIS — R000 Tachycardia, unspecified: Secondary | ICD-10-CM | POA: Diagnosis not present

## 2023-09-24 DIAGNOSIS — R1013 Epigastric pain: Secondary | ICD-10-CM | POA: Diagnosis not present

## 2023-09-24 DIAGNOSIS — K5730 Diverticulosis of large intestine without perforation or abscess without bleeding: Secondary | ICD-10-CM | POA: Diagnosis not present

## 2023-09-24 LAB — CBC WITH DIFFERENTIAL/PLATELET
Abs Immature Granulocytes: 0.03 K/uL (ref 0.00–0.07)
Basophils Absolute: 0 K/uL (ref 0.0–0.1)
Basophils Relative: 0 %
Eosinophils Absolute: 0.1 K/uL (ref 0.0–0.5)
Eosinophils Relative: 1 %
HCT: 46.5 % (ref 39.0–52.0)
Hemoglobin: 16.4 g/dL (ref 13.0–17.0)
Immature Granulocytes: 0 %
Lymphocytes Relative: 37 %
Lymphs Abs: 2.7 K/uL (ref 0.7–4.0)
MCH: 30.7 pg (ref 26.0–34.0)
MCHC: 35.3 g/dL (ref 30.0–36.0)
MCV: 86.9 fL (ref 80.0–100.0)
Monocytes Absolute: 0.6 K/uL (ref 0.1–1.0)
Monocytes Relative: 8 %
Neutro Abs: 3.8 K/uL (ref 1.7–7.7)
Neutrophils Relative %: 54 %
Platelets: 238 K/uL (ref 150–400)
RBC: 5.35 MIL/uL (ref 4.22–5.81)
RDW: 12.9 % (ref 11.5–15.5)
WBC: 7.2 K/uL (ref 4.0–10.5)
nRBC: 0 % (ref 0.0–0.2)

## 2023-09-24 LAB — URINALYSIS, ROUTINE W REFLEX MICROSCOPIC
Bilirubin Urine: NEGATIVE
Glucose, UA: NEGATIVE mg/dL
Hgb urine dipstick: NEGATIVE
Ketones, ur: NEGATIVE mg/dL
Leukocytes,Ua: NEGATIVE
Nitrite: NEGATIVE
Protein, ur: NEGATIVE mg/dL
Specific Gravity, Urine: 1.005 (ref 1.005–1.030)
pH: 8 (ref 5.0–8.0)

## 2023-09-24 LAB — HEPATIC FUNCTION PANEL
ALT: 46 U/L — ABNORMAL HIGH (ref 0–44)
AST: 50 U/L — ABNORMAL HIGH (ref 15–41)
Albumin: 4.6 g/dL (ref 3.5–5.0)
Alkaline Phosphatase: 66 U/L (ref 38–126)
Bilirubin, Direct: 0.3 mg/dL — ABNORMAL HIGH (ref 0.0–0.2)
Indirect Bilirubin: 1.5 mg/dL — ABNORMAL HIGH (ref 0.3–0.9)
Total Bilirubin: 1.8 mg/dL — ABNORMAL HIGH (ref 0.0–1.2)
Total Protein: 8.3 g/dL — ABNORMAL HIGH (ref 6.5–8.1)

## 2023-09-24 LAB — RAPID URINE DRUG SCREEN, HOSP PERFORMED
Amphetamines: NOT DETECTED
Barbiturates: NOT DETECTED
Benzodiazepines: NOT DETECTED
Cocaine: NOT DETECTED
Opiates: NOT DETECTED
Tetrahydrocannabinol: NOT DETECTED

## 2023-09-24 LAB — BASIC METABOLIC PANEL WITH GFR
Anion gap: 14 (ref 5–15)
BUN: 7 mg/dL — ABNORMAL LOW (ref 8–23)
CO2: 23 mmol/L (ref 22–32)
Calcium: 9.1 mg/dL (ref 8.9–10.3)
Chloride: 94 mmol/L — ABNORMAL LOW (ref 98–111)
Creatinine, Ser: 0.78 mg/dL (ref 0.61–1.24)
GFR, Estimated: >60 mL/min (ref >60)
Glucose, Bld: 118 mg/dL — ABNORMAL HIGH (ref 70–99)
Potassium: 3.5 mmol/L (ref 3.5–5.1)
Sodium: 131 mmol/L — ABNORMAL LOW (ref 135–145)

## 2023-09-24 LAB — TROPONIN I (HIGH SENSITIVITY)
Troponin I (High Sensitivity): 3 ng/L (ref <18)
Troponin I (High Sensitivity): 3 ng/L (ref <18)

## 2023-09-24 LAB — LACTIC ACID, PLASMA: Lactic Acid, Venous: 2 mmol/L (ref 0.5–1.9)

## 2023-09-24 LAB — LIPASE, BLOOD: Lipase: 39 U/L (ref 11–51)

## 2023-09-24 LAB — ETHANOL: Alcohol, Ethyl (B): <15 mg/dL (ref <15)

## 2023-09-24 LAB — MAGNESIUM: Magnesium: 1.9 mg/dL (ref 1.7–2.4)

## 2023-09-24 MED ORDER — LIDOCAINE VISCOUS HCL 2 % MT SOLN
15.0000 mL | Freq: Once | OROMUCOSAL | Status: AC
  Administered 2023-09-24: 15 mL via ORAL
  Filled 2023-09-24: qty 15

## 2023-09-24 MED ORDER — CHLORDIAZEPOXIDE HCL 25 MG PO CAPS
25.0000 mg | ORAL_CAPSULE | Freq: Once | ORAL | Status: AC
  Administered 2023-09-24: 25 mg via ORAL
  Filled 2023-09-24: qty 1

## 2023-09-24 MED ORDER — PANTOPRAZOLE SODIUM 20 MG PO TBEC: 20.0000 mg | DELAYED_RELEASE_TABLET | Freq: Two times a day (BID) | ORAL | 0 refills | Status: DC

## 2023-09-24 MED ORDER — LACTATED RINGERS IV BOLUS
1000.0000 mL | Freq: Once | INTRAVENOUS | Status: AC
  Administered 2023-09-24: 1000 mL via INTRAVENOUS

## 2023-09-24 MED ORDER — ALUM & MAG HYDROXIDE-SIMETH 200-200-20 MG/5ML PO SUSP
30.0000 mL | Freq: Once | ORAL | Status: AC
  Administered 2023-09-24: 30 mL via ORAL
  Filled 2023-09-24: qty 30

## 2023-09-24 MED ORDER — METOCLOPRAMIDE HCL 5 MG/ML IJ SOLN
10.0000 mg | Freq: Once | INTRAMUSCULAR | Status: AC
  Administered 2023-09-24: 10 mg via INTRAVENOUS
  Filled 2023-09-24: qty 2

## 2023-09-24 MED ORDER — HYDROMORPHONE HCL 1 MG/ML IJ SOLN
0.5000 mg | Freq: Once | INTRAMUSCULAR | Status: AC
  Administered 2023-09-24: 0.5 mg via INTRAVENOUS
  Filled 2023-09-24: qty 0.5

## 2023-09-24 MED ORDER — SUCRALFATE 1 GM/10ML PO SUSP: 1.0000 g | Freq: Three times a day (TID) | ORAL | 0 refills | Status: DC

## 2023-09-24 MED ORDER — IOHEXOL 300 MG/ML  SOLN
100.0000 mL | Freq: Once | INTRAMUSCULAR | Status: AC | PRN
  Administered 2023-09-24: 100 mL via INTRAVENOUS

## 2023-09-24 NOTE — ED Provider Notes (Signed)
 [place] EMERGENCY DEPARTMENT AT [facility] Provider Note   CSN: [id number] Arrival date & time: 09/24/23  1036     Patient presents with: Emesis   [name] Harper is a 63 y.o. male.   HPI Patient presents for nausea and vomiting.  Medical history includes GERD, gastritis, anxiety, depression, HTN, prediabetes.  He was seen in the ED 2 days ago for nausea and shakiness.  He was prescribed Zofran  and states that this improved his symptoms for a day.  He had recurrence of symptoms starting yesterday.  He describes a epigastric discomfort and nausea.  He has had a difficult time tolerating any p.o. intake.  He has a history of anxiety and is prescribed as needed Klonopin .  He states that he typically takes this 2 times per day.  His last dose was earlier today.    Prior to Admission medications   Medication Sig Start Date End Date Taking? Authorizing Provider  pantoprazole  (PROTONIX ) 20 MG tablet Take 1 tablet (20 mg total) by mouth 2 (two) times daily. 09/24/23  Yes Melvenia Motto, MD  sucralfate  (CARAFATE ) 1 GM/10ML suspension Take 10 mLs (1 g total) by mouth 4 (four) times daily -  with meals and at bedtime. 09/24/23  Yes Melvenia Motto, MD  acetaminophen  (TYLENOL ) 325 MG tablet Take 2 tablets (650 mg total) by mouth every 6 (six) hours as needed for mild pain (pain score 1-3) (or Fever >/= 101). 05/20/23   Shahmehdi, Adriana DELENA, MD  amLODipine  (NORVASC ) 5 MG tablet Take 1 tablet (5 mg total) by mouth daily. 05/20/23 05/19/24  Willette Adriana DELENA, MD  Azelastine  HCl 137 MCG/SPRAY SOLN PLACE 2 SPRAYS INTO BOTH NOSTRILS 2 (TWO) TIMES DAILY AS NEEDED. 09/11/23 10/11/23  Iva Marty Saltness, MD  citalopram  (CELEXA ) 40 MG tablet Take 40 mg by mouth daily.    [provider]  clonazePAM  (KLONOPIN ) 0.5 MG tablet Take 0.5 mg by mouth 3 (three) times daily as needed for anxiety. anxiety 02/10/14   [provider]  EPINEPHrine  0.3 mg/0.3 mL IJ SOAJ injection Inject 0.3 mg into the muscle  as needed for anaphylaxis. USE AS DIRECTED FOR A SEVERE ALLERGIC REACTION. 03/11/22   Iva Marty Saltness, MD  hydrochlorothiazide  (HYDRODIURIL ) 25 MG tablet  06/16/23   [provider]  ipratropium (ATROVENT ) 0.06 % nasal spray Place 2 sprays into both nostrils 2 (two) times daily as needed for rhinitis. 01/27/23   Iva Marty Saltness, MD  levocetirizine (XYZAL ) 5 MG tablet Take 1 tablet (5 mg total) by mouth every evening. 01/27/23   Iva Marty Saltness, MD  lisinopril (ZESTRIL) 10 MG tablet  06/21/23   [provider]  mometasone  (NASONEX ) 50 MCG/ACT nasal spray Place 2 sprays into the nose daily as needed. 01/27/23   Iva Marty Saltness, MD  ondansetron  (ZOFRAN ) 4 MG tablet Take 1 tablet (4 mg total) by mouth every 8 (eight) hours as needed for nausea or vomiting. 09/22/23   Idol, Julie, PA-C  ondansetron  (ZOFRAN -ODT) 4 MG disintegrating tablet Take 1 tablet (4 mg total) by mouth every 8 (eight) hours as needed for nausea or vomiting. 08/08/23   Kommor, Madison, MD  prochlorperazine  (COMPAZINE ) 10 MG tablet Take 1 tablet (10 mg total) by mouth every 6 (six) hours as needed for up to 10 days for nausea or vomiting. 05/20/23 05/30/23  Willette Adriana DELENA, MD  simvastatin  (ZOCOR ) 20 MG tablet Take 20 mg by mouth at bedtime.  11/12/13   [provider]  Allergies: Patient has no known allergies.    Review of Systems  Gastrointestinal:  Positive for abdominal pain, nausea and vomiting.  Neurological:  Positive for tremors.  Psychiatric/Behavioral:  The patient is nervous/anxious.   All other systems reviewed and are negative.   Updated Vital Signs BP 117/81   Pulse (!) 103   Temp 98.1 F (36.7 C) (Oral)   Resp 16   SpO2 93%   Physical Exam Vitals and nursing note reviewed.  Constitutional:      General: He is not in acute distress.    Appearance: Normal appearance. He is well-developed. He is not ill-appearing, toxic-appearing or diaphoretic.  HENT:     Head:  Normocephalic and atraumatic.     Right Ear: External ear normal.     Left Ear: External ear normal.     Nose: Nose normal.     Mouth/Throat:     Mouth: Mucous membranes are moist.  Eyes:     Extraocular Movements: Extraocular movements intact.     Conjunctiva/sclera: Conjunctivae normal.  Cardiovascular:     Rate and Rhythm: Regular rhythm. Tachycardia present.     Heart sounds: No murmur heard. Pulmonary:     Effort: Pulmonary effort is normal. No respiratory distress.     Breath sounds: Normal breath sounds. No wheezing or rales.  Chest:     Chest wall: No tenderness.  Abdominal:     General: There is no distension.     Palpations: Abdomen is soft.     Tenderness: There is abdominal tenderness. There is no guarding or rebound.  Musculoskeletal:        General: No swelling. Normal range of motion.     Cervical back: Normal range of motion and neck supple.  Skin:    General: Skin is warm and dry.     Coloration: Skin is not jaundiced or pale.  Neurological:     General: No focal deficit present.     Mental Status: He is alert and oriented to person, place, and time.  Psychiatric:        Mood and Affect: Mood normal.        Behavior: Behavior normal.     (all labs ordered are listed, but only abnormal results are displayed) Labs Reviewed  URINALYSIS, ROUTINE W REFLEX MICROSCOPIC - Abnormal; Notable for the following components:      Result Value   Color, Urine STRAW (*)    All other components within normal limits  BASIC METABOLIC PANEL WITH GFR - Abnormal; Notable for the following components:   Sodium 131 (*)    Chloride 94 (*)    Glucose, Bld 118 (*)    BUN 7 (*)    All other components within normal limits  HEPATIC FUNCTION PANEL - Abnormal; Notable for the following components:   Total Protein 8.3 (*)    AST 50 (*)    ALT 46 (*)    Total Bilirubin 1.8 (*)    Bilirubin, Direct 0.3 (*)    Indirect Bilirubin 1.5 (*)    All other components within normal limits   LACTIC ACID, PLASMA - Abnormal; Notable for the following components:   Lactic Acid, Venous 2.0 (*)    All other components within normal limits  LIPASE, BLOOD  CBC WITH DIFFERENTIAL/PLATELET  RAPID URINE DRUG SCREEN, HOSP PERFORMED  ETHANOL  MAGNESIUM   TROPONIN I (HIGH SENSITIVITY)  TROPONIN I (HIGH SENSITIVITY)    EKG: EKG Interpretation Date/Time:  Sunday September 24 2023 10:53:29 EDT  Ventricular Rate:  121 PR Interval:  152 QRS Duration:  84 QT Interval:  321 QTC Calculation: 456 R Axis:   117  Text Interpretation: Sinus tachycardia Left posterior fascicular block Confirmed by Melvenia Motto ([id number]) on 09/24/2023 11:25:51 AM  Radiology: CT ABDOMEN PELVIS W CONTRAST Result Date: 09/24/2023 CLINICAL DATA:  Abdominal pain, acute, nonlocalized. Epigastric pain with nausea and vomiting. EXAM: CT ABDOMEN AND PELVIS WITH CONTRAST TECHNIQUE: Multidetector CT imaging of the abdomen and pelvis was performed using the standard protocol following bolus administration of intravenous contrast. RADIATION DOSE REDUCTION: This exam was performed according to the departmental dose-optimization program which includes automated exposure control, adjustment of the mA and/or kV according to patient size and/or use of iterative reconstruction technique. CONTRAST:  OMNIPAQUE  IOHEXOL  300 MG/ML  SOLN COMPARISON:  09/22/2023 and 05/18/2023 FINDINGS: Lower chest: Chronic bandlike density in the medial right lower lobe is most likely related to scarring. No acute abnormality of the lung bases. Motion artifact at the lung bases. Hepatobiliary: Decreased attenuation of the liver is compatible with steatosis. Mild distention of the gallbladder without surrounding inflammatory changes. No significant dilatation of the intrahepatic or extrahepatic bile ducts. Question a small vascular malformation involving a portal branch on image 18/2 and this is likely an incidental finding. Pancreas: Unremarkable. No pancreatic ductal  dilatation or surrounding inflammatory changes. Spleen: Normal in size without focal abnormality. Adrenals/Urinary Tract: Normal adrenal glands. Normal appearance of both kidneys without hydronephrosis. Urinary bladder is decompressed. Stomach/Bowel: Colonic diverticula without acute bowel inflammation. No bowel dilatation or obstruction. Normal appearance of the appendix. Small hiatal hernia. Vascular/Lymphatic: Mild atherosclerotic disease in the abdominal aorta without aneurysm. Main visceral arteries are patent. No significant lymph node enlargement in the abdomen or pelvis. Reproductive: No acute abnormality to the prostate. Other: Negative for free fluid. Negative for free air. Small nodular densities in the right anterior abdominal cavity on images 31 and 35 probably represent granuloma formations. These have not significantly changed since 05/18/2023. No suspicious peritoneal nodules. Left inguinal hernia containing fat. Musculoskeletal: Old left posterior eleventh rib fracture. 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 Disc space narrowing and endplate changes at L3-L4. IMPRESSION: 1. No acute abnormality in the abdomen or pelvis. 2. Hepatic steatosis. 3. Colonic diverticulosis without acute bowel inflammation. 4. Small hiatal hernia. 5. Aortic Atherosclerosis (ICD10-I70.0). Electronically Signed   By: Juliene Balder M.D.   On: 09/24/2023 13:16   CT ABDOMEN PELVIS W CONTRAST Result Date: 09/22/2023 CLINICAL DATA:  Nausea for 1 week EXAM: CT ABDOMEN AND PELVIS WITH CONTRAST TECHNIQUE: Multidetector CT imaging of the abdomen and pelvis was performed using the standard protocol following bolus administration of intravenous contrast. RADIATION DOSE REDUCTION: This exam was performed according to the departmental dose-optimization program which includes automated exposure control, adjustment of the mA and/or kV according to patient size and/or use of iterative reconstruction technique.  CONTRAST:  OMNIPAQUE  IOHEXOL  300 MG/ML  SOLN COMPARISON:  05/18/2023 FINDINGS: Lower chest: No acute pleural or parenchymal lung disease. Hepatobiliary: Diffuse hepatic steatosis with areas of fatty sparing near the gallbladder fossa. No evidence of cholelithiasis or cholecystitis. No biliary duct dilation. Pancreas: Unremarkable. No pancreatic ductal dilatation or surrounding inflammatory changes. Spleen: Normal in size without focal abnormality. Adrenals/Urinary Tract: Adrenal glands are unremarkable. Kidneys are normal, without renal calculi, focal lesion, or hydronephrosis. Bladder is unremarkable. Stomach/Bowel: No evidence of bowel obstruction or ileus. Normal appendix right lower quadrant. Scattered diverticulosis throughout the colon with no  evidence of acute diverticulitis. There is no bowel wall thickening or inflammatory change. Stable small hiatal hernia. Vascular/Lymphatic: Aortic atherosclerosis. No enlarged abdominal or pelvic lymph nodes. Reproductive: Prostate is unremarkable. Other: No free fluid or free intraperitoneal gas. Stable fat containing left inguinal hernia. No bowel herniation. Musculoskeletal: No acute or destructive bony abnormalities. 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 Reconstructed images demonstrate no additional findings. IMPRESSION: 1. No acute intra-abdominal or intrapelvic process. 2. Colonic diverticulosis without diverticulitis. 3. Hepatic steatosis. 4. Small hiatal hernia. 5. Stable fat containing left inguinal hernia. 6.  Aortic Atherosclerosis (ICD10-I70.0). Electronically Signed   By: Ozell Daring M.D.   On: 09/22/2023 16:40     Procedures   Medications Ordered in the ED  lactated ringers  bolus 1,000 mL (has no administration in time range)  metoCLOPramide  (REGLAN ) injection 10 mg (10 mg Intravenous Given 09/24/23 1133)  HYDROmorphone  (DILAUDID ) injection 0.5 mg (0.5 mg Intravenous Given 09/24/23 1134)  iohexol   (OMNIPAQUE ) 300 MG/ML solution 100 mL (100 mLs Intravenous Contrast Given 09/24/23 1249)  alum & mag hydroxide-simeth (MAALOX/MYLANTA) 200-200-20 MG/5ML suspension 30 mL (30 mLs Oral Given 09/24/23 1351)    And  lidocaine  (XYLOCAINE ) 2 % viscous mouth solution 15 mL (15 mLs Oral Given 09/24/23 1351)  chlordiazePOXIDE  (LIBRIUM ) capsule 25 mg (25 mg Oral Given 09/24/23 1350)                                    Medical Decision Making Amount and/or Complexity of Data Reviewed Labs: ordered. Radiology: ordered.  Risk OTC drugs. Prescription drug management.   This patient presents to the ED for concern of epigastric pain, nausea, vomiting, this involves an extensive number of treatment options, and is a complaint that carries with it a high risk of complications and morbidity.  The differential diagnosis includes gastritis, PUD, GERD, benzo withdrawal, dehydration, metabolic derangements, ACS, pancreatitis   Co morbidities / Chronic conditions that complicate the patient evaluation  GERD, gastritis, anxiety, depression, HTN, prediabetes   Additional history obtained:  Additional history obtained from EMR External records from outside source obtained and reviewed including N/A   Lab Tests:  I Ordered, and personally interpreted labs.  The pertinent results include: Normal hemoglobin, no leukocytosis, normal kidney function, slight hyponatremia and hypochloremia, slight elevation in transaminases, normal troponin   Imaging Studies ordered:  I ordered imaging studies including CT of abdomen and pelvis I independently visualized and interpreted imaging which showed no acute findings I agree with the radiologist interpretation   Cardiac Monitoring: / EKG:  The patient was maintained on a cardiac monitor.  I personally viewed and interpreted the cardiac monitored which showed an underlying rhythm of: Sinus rhythm   Problem List / ED Course / Critical interventions / Medication  management  Patient presents for epigastric discomfort, nausea, vomiting.  Seen in the ED recently for the same.  Recurrence of symptoms began this morning.  On arrival in the ED, patient is tachycardic.  He is tremulous on exam and endorses a significant epigastric discomfort.  He does take Klonopin  as needed.  There is concern of possible benzo withdrawal based on exam.  Reglan  and Dilaudid  were ordered for symptomatic relief.  Librium  was ordered for possible benzo withdrawal.  Workup was initiated.  Patient's lab work is unremarkable.  CT imaging of abdomen and pelvis did not show any acute findings.  He does have  hiatal hernia which may worsen reflux.  He was given a GI cocktail and had significant relief.  He is now asymptomatic.  I suspect gastritis/PUD.  Patient is currently not on a PPI.  Will prescribe in addition to Carafate .  He states that he does not drink or use alcohol .  He does take ibuprofen occasionally.  He was counseled against this.  He would benefit from GI follow-up.  At this time, he is stable for discharge. I ordered medication including Dilaudid  and GI cocktail for analgesia; Reglan  for nausea; Librium  for possible benzo withdrawal Reevaluation of the patient after these medicines showed that the patient improved I have reviewed the patients home medicines and have made adjustments as needed  Social Determinants of Health:  Lives independently, limited English speaking      Final diagnoses:  Gastritis without bleeding, unspecified chronicity, unspecified gastritis type    ED Discharge Orders          Ordered    pantoprazole  (PROTONIX ) 20 MG tablet  2 times daily        09/24/23 1436    sucralfate  (CARAFATE ) 1 GM/10ML suspension  3 times daily with meals & bedtime        09/24/23 1436               Melvenia Motto, MD 09/24/23 1437

## 2023-09-24 NOTE — Discharge Instructions (Addendum)
 Prescriptions were sent to your pharmacy.  Take these as prescribed.  Avoid use of ibuprofen.  Take Tylenol  as needed for headaches.  Call the telephone number below to set up a follow-up appointment with a gastroenterologist.  Return to the emergency department for any new or worsening symptoms of concern.

## 2023-09-24 NOTE — ED Triage Notes (Signed)
 Pt c/o ongoing epigastric pain with N/V causing him to feel anxiety. Pt also reports that he has been feeling palpitations over the last few days. Denies SOB. Pt took prescribed PRN antiemetic at 0630 this morning.

## 2023-09-28 ENCOUNTER — Emergency Department (HOSPITAL_COMMUNITY)

## 2023-09-28 ENCOUNTER — Encounter (HOSPITAL_COMMUNITY): Payer: Self-pay

## 2023-09-28 ENCOUNTER — Emergency Department (HOSPITAL_COMMUNITY): Admission: EM | Admit: 2023-09-28 | Discharge: 2023-09-28 | Disposition: A | Discharge status: Home / Self Care

## 2023-09-28 ENCOUNTER — Other Ambulatory Visit: Payer: Self-pay

## 2023-09-28 DIAGNOSIS — R109 Unspecified abdominal pain: Secondary | ICD-10-CM | POA: Diagnosis not present

## 2023-09-28 DIAGNOSIS — K2970 Gastritis, unspecified, without bleeding: Secondary | ICD-10-CM | POA: Diagnosis not present

## 2023-09-28 DIAGNOSIS — R531 Weakness: Secondary | ICD-10-CM | POA: Diagnosis not present

## 2023-09-28 DIAGNOSIS — R0789 Other chest pain: Secondary | ICD-10-CM | POA: Diagnosis not present

## 2023-09-28 DIAGNOSIS — K859 Acute pancreatitis without necrosis or infection, unspecified: Secondary | ICD-10-CM | POA: Diagnosis not present

## 2023-09-28 DIAGNOSIS — R0602 Shortness of breath: Secondary | ICD-10-CM | POA: Insufficient documentation

## 2023-09-28 DIAGNOSIS — K2900 Acute gastritis without bleeding: Secondary | ICD-10-CM | POA: Diagnosis not present

## 2023-09-28 HISTORY — DX: Gastro-esophageal reflux disease without esophagitis: K21.9

## 2023-09-28 HISTORY — DX: Gastritis, unspecified, without bleeding: K29.70

## 2023-09-28 LAB — COMPREHENSIVE METABOLIC PANEL WITH GFR
ALT: 42 U/L (ref 0–44)
AST: 35 U/L (ref 15–41)
Albumin: 4.4 g/dL (ref 3.5–5.0)
Alkaline Phosphatase: 69 U/L (ref 38–126)
Anion gap: 14 (ref 5–15)
BUN: 14 mg/dL (ref 8–23)
CO2: 24 mmol/L (ref 22–32)
Calcium: 9.3 mg/dL (ref 8.9–10.3)
Chloride: 96 mmol/L — ABNORMAL LOW (ref 98–111)
Creatinine, Ser: 0.7 mg/dL (ref 0.61–1.24)
GFR, Estimated: >60 mL/min (ref >60)
Glucose, Bld: 91 mg/dL (ref 70–99)
Potassium: 3.7 mmol/L (ref 3.5–5.1)
Sodium: 134 mmol/L — ABNORMAL LOW (ref 135–145)
Total Bilirubin: 0.8 mg/dL (ref 0.0–1.2)
Total Protein: 8.1 g/dL (ref 6.5–8.1)

## 2023-09-28 LAB — LIPASE, BLOOD: Lipase: 54 U/L — ABNORMAL HIGH (ref 11–51)

## 2023-09-28 LAB — CBC WITH DIFFERENTIAL/PLATELET
Abs Immature Granulocytes: 0.03 K/uL (ref 0.00–0.07)
Basophils Absolute: 0 K/uL (ref 0.0–0.1)
Basophils Relative: 1 %
Eosinophils Absolute: 0.1 K/uL (ref 0.0–0.5)
Eosinophils Relative: 1 %
HCT: 44.9 % (ref 39.0–52.0)
Hemoglobin: 15.8 g/dL (ref 13.0–17.0)
Immature Granulocytes: 0 %
Lymphocytes Relative: 43 %
Lymphs Abs: 3.7 K/uL (ref 0.7–4.0)
MCH: 30.9 pg (ref 26.0–34.0)
MCHC: 35.2 g/dL (ref 30.0–36.0)
MCV: 87.9 fL (ref 80.0–100.0)
Monocytes Absolute: 0.6 K/uL (ref 0.1–1.0)
Monocytes Relative: 7 %
Neutro Abs: 4.1 K/uL (ref 1.7–7.7)
Neutrophils Relative %: 48 %
Platelets: 224 K/uL (ref 150–400)
RBC: 5.11 MIL/uL (ref 4.22–5.81)
RDW: 12.9 % (ref 11.5–15.5)
WBC: 8.6 K/uL (ref 4.0–10.5)
nRBC: 0 % (ref 0.0–0.2)

## 2023-09-28 LAB — TROPONIN I (HIGH SENSITIVITY)
Troponin I (High Sensitivity): 3 ng/L (ref <18)
Troponin I (High Sensitivity): 3 ng/L (ref <18)

## 2023-09-28 LAB — LACTIC ACID, PLASMA
Lactic Acid, Venous: 1.9 mmol/L (ref 0.5–1.9)
Lactic Acid, Venous: 1.1 mmol/L (ref 0.5–1.9)

## 2023-09-28 LAB — BRAIN NATRIURETIC PEPTIDE: B Natriuretic Peptide: 5 pg/mL (ref 0.0–100.0)

## 2023-09-28 MED ORDER — SODIUM CHLORIDE 0.9 % IV BOLUS
500.0000 mL | Freq: Once | INTRAVENOUS | Status: AC
  Administered 2023-09-28: 500 mL via INTRAVENOUS

## 2023-09-28 MED ORDER — ONDANSETRON HCL 4 MG/2ML IJ SOLN
4.0000 mg | Freq: Once | INTRAMUSCULAR | Status: AC
  Administered 2023-09-28: 4 mg via INTRAVENOUS
  Filled 2023-09-28: qty 2

## 2023-09-28 MED ORDER — ALUM & MAG HYDROXIDE-SIMETH 200-200-20 MG/5ML PO SUSP
30.0000 mL | Freq: Once | ORAL | Status: AC
  Administered 2023-09-28: 30 mL via ORAL
  Filled 2023-09-28: qty 30

## 2023-09-28 MED ORDER — LIDOCAINE VISCOUS HCL 2 % MT SOLN
15.0000 mL | Freq: Once | OROMUCOSAL | Status: AC
  Administered 2023-09-28: 15 mL via OROMUCOSAL
  Filled 2023-09-28: qty 15

## 2023-09-28 MED ORDER — ONDANSETRON HCL 4 MG PO TABS: 4.0000 mg | ORAL_TABLET | Freq: Three times a day (TID) | ORAL | 0 refills | Status: AC | PRN

## 2023-09-28 NOTE — ED Triage Notes (Signed)
 Pt arrived via POV c/o recurrent abdominal pain, N/V. Pt reports taking his prescribed medications w/o relief. Pt reports unable to follow up with GI until the 26th of this month.

## 2023-09-28 NOTE — Discharge Instructions (Addendum)
 Call and follow-up with your primary care doctor.  Call the GI office tomorrow to try to reschedule your appointment or get on the cancellation list at the office.  Continue your Carafate  and pantoprazole  as prescribed.  Take your Zofran  up to 3 times a day as needed for nausea and vomiting.  For the next 24 hours keep a clear liquid diet and after that you can start to eat bland food.  Avoid dairy and heavy or greasy foods.  Return to the ER for any new or worsening symptoms.

## 2023-09-28 NOTE — ED Provider Notes (Signed)
 [place] EMERGENCY DEPARTMENT AT [facility] Provider Note   CSN: [id number] Arrival date & time: 09/28/23  1100     Patient presents with: Emesis   [name] Harper is a 63 y.o. male.   63 year old male presents for evaluation of abdominal pain.  He is pointing to his left chest.  He has been here 3 other times for similar symptoms with negative workup.  Diagnosed with gastritis.  Patient states he has been taking his Carafate  and Protonix  with no improvement in his symptoms.  Admits to increased nausea and vomiting 4 times today.  States he tried to follow-up with GI but does not have an appointment till the end of this month.  Also admits to some shortness of breath.  Denies any other symptoms or concerns at this time.   Emesis Associated symptoms: abdominal pain   Associated symptoms: no arthralgias, no chills, no cough, no fever and no sore throat        Prior to Admission medications   Medication Sig Start Date End Date Taking? Authorizing Provider  ondansetron  (ZOFRAN ) 4 MG tablet Take 1 tablet (4 mg total) by mouth every 8 (eight) hours as needed for up to 4 days. 09/28/23 10/02/23 Yes Ikey Omary L, DO  acetaminophen  (TYLENOL ) 325 MG tablet Take 2 tablets (650 mg total) by mouth every 6 (six) hours as needed for mild pain (pain score 1-3) (or Fever >/= 101). 05/20/23   Shahmehdi, Seyed A, MD  amLODipine  (NORVASC ) 5 MG tablet Take 1 tablet (5 mg total) by mouth daily. 05/20/23 05/19/24  Willette Adriana DELENA, MD  Azelastine  HCl 137 MCG/SPRAY SOLN PLACE 2 SPRAYS INTO BOTH NOSTRILS 2 (TWO) TIMES DAILY AS NEEDED. 09/11/23 10/11/23  Iva Marty Saltness, MD  citalopram  (CELEXA ) 40 MG tablet Take 40 mg by mouth daily.    [provider]  clonazePAM  (KLONOPIN ) 0.5 MG tablet Take 0.5 mg by mouth 3 (three) times daily as needed for anxiety. anxiety 02/10/14   [provider]  EPINEPHrine  0.3 mg/0.3 mL IJ SOAJ injection Inject 0.3 mg into the muscle as needed for  anaphylaxis. USE AS DIRECTED FOR A SEVERE ALLERGIC REACTION. 03/11/22   Iva Marty Saltness, MD  hydrochlorothiazide  (HYDRODIURIL ) 25 MG tablet  06/16/23   [provider]  ipratropium (ATROVENT ) 0.06 % nasal spray Place 2 sprays into both nostrils 2 (two) times daily as needed for rhinitis. 01/27/23   Iva Marty Saltness, MD  levocetirizine (XYZAL ) 5 MG tablet Take 1 tablet (5 mg total) by mouth every evening. 01/27/23   Iva Marty Saltness, MD  lisinopril (ZESTRIL) 10 MG tablet  06/21/23   [provider]  mometasone  (NASONEX ) 50 MCG/ACT nasal spray Place 2 sprays into the nose daily as needed. 01/27/23   Iva Marty Saltness, MD  ondansetron  (ZOFRAN ) 4 MG tablet Take 1 tablet (4 mg total) by mouth every 8 (eight) hours as needed for nausea or vomiting. 09/22/23   Idol, Julie, PA-C  ondansetron  (ZOFRAN -ODT) 4 MG disintegrating tablet Take 1 tablet (4 mg total) by mouth every 8 (eight) hours as needed for nausea or vomiting. 08/08/23   Kommor, Madison, MD  pantoprazole  (PROTONIX ) 20 MG tablet Take 1 tablet (20 mg total) by mouth 2 (two) times daily. 09/24/23   Melvenia Motto, MD  prochlorperazine  (COMPAZINE ) 10 MG tablet Take 1 tablet (10 mg total) by mouth every 6 (six) hours as needed for up to 10 days for nausea or vomiting. 05/20/23 05/30/23  Willette Adriana DELENA, MD  simvastatin  (ZOCOR ) 20 MG tablet Take 20 mg by mouth at bedtime.  11/12/13   [provider]  sucralfate  (CARAFATE ) 1 GM/10ML suspension Take 10 mLs (1 g total) by mouth 4 (four) times daily -  with meals and at bedtime. 09/24/23   Melvenia Motto, MD    Allergies: Patient has no known allergies.    Review of Systems  Constitutional:  Negative for chills and fever.  HENT:  Negative for ear pain and sore throat.   Eyes:  Negative for pain and visual disturbance.  Respiratory:  Positive for shortness of breath. Negative for cough.   Cardiovascular:  Positive for chest pain. Negative for palpitations.  Gastrointestinal:   Positive for abdominal pain, nausea and vomiting.  Genitourinary:  Negative for dysuria and hematuria.  Musculoskeletal:  Negative for arthralgias and back pain.  Skin:  Negative for color change and rash.  Neurological:  Negative for seizures and syncope.  All other systems reviewed and are negative.   Updated Vital Signs BP (!) 126/94   Pulse 91   Temp 98.5 F (36.9 C) (Oral)   Resp 16   Ht 5' 4 (1.626 m)   Wt 84.4 kg   SpO2 94%   BMI 31.94 kg/m   Physical Exam Vitals and nursing note reviewed.  Constitutional:      General: He is in acute distress.     Appearance: Normal appearance. He is well-developed.     Comments: Anxious Appearing  HENT:     Head: Normocephalic and atraumatic.  Eyes:     Conjunctiva/sclera: Conjunctivae normal.  Cardiovascular:     Rate and Rhythm: Regular rhythm. Tachycardia present.     Heart sounds: No murmur heard. Pulmonary:     Effort: Pulmonary effort is normal. No respiratory distress.     Breath sounds: Normal breath sounds.  Abdominal:     General: There is no distension.     Palpations: Abdomen is soft. There is no mass.     Tenderness: There is no abdominal tenderness.     Hernia: No hernia is present.  Musculoskeletal:        General: No swelling.     Cervical back: Neck supple.  Skin:    General: Skin is warm and dry.     Capillary Refill: Capillary refill takes less than 2 seconds.  Neurological:     Mental Status: He is alert.  Psychiatric:        Mood and Affect: Mood normal.     (all labs ordered are listed, but only abnormal results are displayed) Labs Reviewed  COMPREHENSIVE METABOLIC PANEL WITH GFR - Abnormal; Notable for the following components:      Result Value   Sodium 134 (*)    Chloride 96 (*)    All other components within normal limits  LIPASE, BLOOD - Abnormal; Notable for the following components:   Lipase 54 (*)    All other components within normal limits  LACTIC ACID, PLASMA  LACTIC ACID,  PLASMA  CBC WITH DIFFERENTIAL/PLATELET  BRAIN NATRIURETIC PEPTIDE  URINALYSIS, W/ REFLEX TO CULTURE (INFECTION SUSPECTED)  TROPONIN I (HIGH SENSITIVITY)  TROPONIN I (HIGH SENSITIVITY)    EKG: EKG Interpretation Date/Time:  Thursday September 28 2023 12:11:13 EDT Ventricular Rate:  96 PR Interval:  176 QRS Duration:  85 QT Interval:  317 QTC Calculation: 401 R Axis:   49  Text Interpretation: Sinus rhythm Compared with prior EKG from 09/24/2023 Confirmed by Gennaro Bouchard ([id number]) on 09/28/2023 12:30:49 PM  Radiology: DG Chest 2 View Result Date: 09/28/2023 CLINICAL DATA:  Weakness.  Abdominal pain. EXAM: CHEST - 2 VIEW COMPARISON:  05/18/2023 FINDINGS: The heart size and mediastinal contours are within normal limits. Both lungs are clear. The visualized skeletal structures are unremarkable. IMPRESSION: No active cardiopulmonary disease. Electronically Signed   By: Norleen DELENA Kil M.D.   On: 09/28/2023 12:08     Procedures   Medications Ordered in the ED  sodium chloride  0.9 % bolus 500 mL (0 mLs Intravenous Stopped 09/28/23 1347)  ondansetron  (ZOFRAN ) injection 4 mg (4 mg Intravenous Given 09/28/23 1156)  alum & mag hydroxide-simeth (MAALOX/MYLANTA) 200-200-20 MG/5ML suspension 30 mL (30 mLs Oral Given 09/28/23 1156)  lidocaine  (XYLOCAINE ) 2 % viscous mouth solution 15 mL (15 mLs Mouth/Throat Given 09/28/23 1156)                                    Medical Decision Making Cardiac monitor interpretation: Initially sinus tachycardia but then improved after fluids to sinus rhythm, no ectopy  Patient here for ongoing abdominal pain.  Has a follow-up with GI later this month.  After GI cocktail, Zofran  and fluids he is feeling much better.  Initially somewhat tachycardic but that improved.  Lipase somewhat elevated but labs otherwise fairly unremarkable.  I advised to continue Protonix  and Carafate  will start him on Zofran .  Advised to call the GI office to see if he can move his appointment to any  earlier.  Advised on clear liquid diet and bland diet after that until he sees GI.  Advised return for any new or worsening symptoms.  He feels comfortable being discharged home.  Problems Addressed: Acute pancreatitis, unspecified complication status, unspecified pancreatitis type: acute illness or injury Atypical chest pain: undiagnosed new problem with uncertain prognosis Gastritis without bleeding, unspecified chronicity, unspecified gastritis type: chronic illness or injury with exacerbation, progression, or side effects of treatment  Amount and/or Complexity of Data Reviewed External Data Reviewed: notes.    Details: Prior ED records reviewed and patient was seen here a few days ago and 2 days prior to that diagnosed with gastritis and started on Protonix  and Carafate  Labs: ordered. Decision-making details documented in ED Course.    Details: Ordered and reviewed by me and he has a slight elevation in his lipase but that is still under 100.  Labs otherwise fairly unremarkable, no leukocytosis and troponins are negative Radiology: ordered and independent interpretation performed. Decision-making details documented in ED Course.    Details: Ordered and interpreted by me independently of radiology Chest X-ray: Shows no acute abnormality chest ECG/medicine tests: ordered and independent interpretation performed. Decision-making details documented in ED Course.    Details: Interpreted by me and shows sinus rhythm, no STEMI no acute change when compared to prior EKG  Risk OTC drugs. Prescription drug management. Drug therapy requiring intensive monitoring for toxicity. Diagnosis or treatment significantly limited by social determinants of health.   Social determinants of health: English is a second language, benzodiazepine dependece  Final diagnoses:  Acute pancreatitis, unspecified complication status, unspecified pancreatitis type  Gastritis without bleeding, unspecified chronicity,  unspecified gastritis type  Atypical chest pain    ED Discharge Orders          Ordered    ondansetron  (ZOFRAN ) 4 MG tablet  Every 8 hours PRN        09/28/23 1330  Gennaro Duwaine CROME, DO 09/28/23 1542

## 2023-10-02 DIAGNOSIS — Z6835 Body mass index (BMI) 35.0-35.9, adult: Secondary | ICD-10-CM | POA: Diagnosis not present

## 2023-10-02 DIAGNOSIS — R112 Nausea with vomiting, unspecified: Secondary | ICD-10-CM | POA: Diagnosis not present

## 2023-10-02 DIAGNOSIS — R110 Nausea: Secondary | ICD-10-CM | POA: Diagnosis not present

## 2023-10-11 DIAGNOSIS — E119 Type 2 diabetes mellitus without complications: Secondary | ICD-10-CM | POA: Diagnosis not present

## 2023-10-11 DIAGNOSIS — E782 Mixed hyperlipidemia: Secondary | ICD-10-CM | POA: Diagnosis not present

## 2023-10-17 ENCOUNTER — Ambulatory Visit (INDEPENDENT_AMBULATORY_CARE_PROVIDER_SITE_OTHER): Admitting: Gastroenterology

## 2023-10-17 ENCOUNTER — Encounter: Payer: Self-pay | Admitting: Gastroenterology

## 2023-10-17 VITALS — BP 150/91 | HR 112 | Temp 98.4°F | Ht 64.0 in | Wt 193.2 lb

## 2023-10-17 DIAGNOSIS — R110 Nausea: Secondary | ICD-10-CM

## 2023-10-17 DIAGNOSIS — K760 Fatty (change of) liver, not elsewhere classified: Secondary | ICD-10-CM

## 2023-10-17 DIAGNOSIS — R748 Abnormal levels of other serum enzymes: Secondary | ICD-10-CM | POA: Insufficient documentation

## 2023-10-17 DIAGNOSIS — R7989 Other specified abnormal findings of blood chemistry: Secondary | ICD-10-CM | POA: Diagnosis not present
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
## 2023-10-17 NOTE — Patient Instructions (Signed)
 Please complete labs at a [facility]. We have one here at [facility] in [place].  Start pantoprazole  40mg  before breakfast and 40mg  before evening meal.   Return in 3-4 weeks to see how you are doing. If not a lot better, would recommend further testing.

## 2023-10-18 ENCOUNTER — Other Ambulatory Visit: Payer: Self-pay

## 2023-10-18 MED ORDER — PANTOPRAZOLE SODIUM 40 MG PO TBEC: 40.0000 mg | DELAYED_RELEASE_TABLET | Freq: Two times a day (BID) | ORAL | 11 refills | Status: DC

## 2023-10-20 LAB — HEPATITIS C ANTIBODY: Hep C Virus Ab: NONREACTIVE

## 2023-10-20 LAB — COMPREHENSIVE METABOLIC PANEL WITH GFR
ALT: 46 IU/L — ABNORMAL HIGH (ref 0–44)
AST: 35 IU/L (ref 0–40)
Albumin: 4.9 g/dL (ref 3.9–4.9)
Alkaline Phosphatase: 82 IU/L (ref 44–121)
BUN/Creatinine Ratio: 9 — ABNORMAL LOW (ref 10–24)
BUN: 8 mg/dL (ref 8–27)
Bilirubin Total: 0.8 mg/dL (ref 0.0–1.2)
CO2: 26 mmol/L (ref 20–29)
Calcium: 9.5 mg/dL (ref 8.6–10.2)
Chloride: 92 mmol/L — ABNORMAL LOW (ref 96–106)
Creatinine, Ser: 0.92 mg/dL (ref 0.76–1.27)
Globulin, Total: 3 g/dL (ref 1.5–4.5)
Glucose: 101 mg/dL — ABNORMAL HIGH (ref 70–99)
Potassium: 4.1 mmol/L (ref 3.5–5.2)
Sodium: 133 mmol/L — ABNORMAL LOW (ref 134–144)
Total Protein: 7.9 g/dL (ref 6.0–8.5)
eGFR: 93 mL/min/1.73 (ref >59)

## 2023-10-20 LAB — CBC WITH DIFFERENTIAL/PLATELET
Basophils Absolute: 0 x10E3/uL (ref 0.0–0.2)
Basos: 1 %
EOS (ABSOLUTE): 0.1 x10E3/uL (ref 0.0–0.4)
Eos: 1 %
Hematocrit: 48.7 % (ref 37.5–51.0)
Hemoglobin: 15.9 g/dL (ref 13.0–17.7)
Immature Grans (Abs): 0 x10E3/uL (ref 0.0–0.1)
Immature Granulocytes: 0 %
Lymphocytes Absolute: 2.4 x10E3/uL (ref 0.7–3.1)
Lymphs: 31 %
MCH: 29.7 pg (ref 26.6–33.0)
MCHC: 32.6 g/dL (ref 31.5–35.7)
MCV: 91 fL (ref 79–97)
Monocytes Absolute: 0.7 x10E3/uL (ref 0.1–0.9)
Monocytes: 9 %
Neutrophils Absolute: 4.5 x10E3/uL (ref 1.4–7.0)
Neutrophils: 58 %
Platelets: 235 x10E3/uL (ref 150–450)
RBC: 5.36 x10E6/uL (ref 4.14–5.80)
RDW: 13 % (ref 11.6–15.4)
WBC: 7.7 x10E3/uL (ref 3.4–10.8)

## 2023-10-20 LAB — IRON,TIBC AND FERRITIN PANEL
Ferritin: 154 ng/mL (ref 30–400)
Iron Saturation: 16 % (ref 15–55)
Iron: 68 ug/dL (ref 38–169)
Total Iron Binding Capacity: 414 ug/dL (ref 250–450)
UIBC: 346 ug/dL — ABNORMAL HIGH (ref 111–343)

## 2023-10-20 LAB — HEPATITIS B SURFACE ANTIBODY,QUALITATIVE: Hep B Surface Ab, Qual: NONREACTIVE

## 2023-10-20 LAB — LIPASE: Lipase: 42 U/L (ref 13–78)

## 2023-10-20 LAB — ALPHA-GAL PANEL
Allergen Lamb IgE: <0.1 kU/L
Beef IgE: 0.1 kU/L — AB
IgE (Immunoglobulin E), Serum: 67 [IU]/mL (ref 6–495)
O215-IgE Alpha-Gal: 0.24 kU/L — AB
Pork IgE: <0.1 kU/L

## 2023-10-20 LAB — HEPATITIS A ANTIBODY, TOTAL: hep A Total Ab: POSITIVE — AB

## 2023-10-20 LAB — IGA: IgA/Immunoglobulin A, Serum: 505 mg/dL — ABNORMAL HIGH (ref 61–437)

## 2023-10-20 LAB — TISSUE TRANSGLUTAMINASE, IGA: Transglutaminase IgA: <2 U/mL (ref 0–3)

## 2023-10-20 LAB — HEPATITIS B SURFACE ANTIGEN: Hepatitis B Surface Ag: NEGATIVE

## 2023-10-21 ENCOUNTER — Ambulatory Visit: Payer: Self-pay | Admitting: Gastroenterology

## 2023-10-24 ENCOUNTER — Telehealth: Payer: Self-pay

## 2023-10-24 NOTE — Telephone Encounter (Signed)
 Tammy, please clarify, he told me at ov he had plenty of zofran  and was using 2-3 times daily. Is he out?

## 2023-10-24 NOTE — Telephone Encounter (Signed)
 Pt called stating that he is still waking up very nauseous. Pt is requesting something for nausea to be sent in.

## 2023-10-25 MED ORDER — ONDANSETRON HCL 4 MG PO TABS: 4.0000 mg | ORAL_TABLET | Freq: Four times a day (QID) | ORAL | 0 refills | Status: DC | PRN

## 2023-10-25 NOTE — Telephone Encounter (Signed)
 Pt was made aware and verbalized understanding.

## 2023-10-25 NOTE — Telephone Encounter (Signed)
 That is correct, I asked him if he still had the ones that he got from the ED and he stated that he did not.

## 2023-10-25 NOTE — Telephone Encounter (Signed)
 I have sent in more zofran . If he is not feeling better in a couple of weeks (on increased PPI) he may need to consider upper endoscopy as discussed during ov.   Keep ov with Dr. Shaaron.

## 2023-10-25 NOTE — Addendum Note (Signed)
 Addended by: EZZARD SONNY RAMAN on: 10/25/2023 08:11 AM   Modules accepted: Orders

## 2023-10-26 DIAGNOSIS — F3341 Major depressive disorder, recurrent, in partial remission: Secondary | ICD-10-CM | POA: Diagnosis not present

## 2023-10-26 DIAGNOSIS — E7849 Other hyperlipidemia: Secondary | ICD-10-CM | POA: Diagnosis not present

## 2023-10-26 DIAGNOSIS — I10 Essential (primary) hypertension: Secondary | ICD-10-CM | POA: Diagnosis not present

## 2023-10-26 DIAGNOSIS — Z23 Encounter for immunization: Secondary | ICD-10-CM | POA: Diagnosis not present

## 2023-10-26 DIAGNOSIS — F419 Anxiety disorder, unspecified: Secondary | ICD-10-CM | POA: Diagnosis not present

## 2023-10-26 DIAGNOSIS — Z6835 Body mass index (BMI) 35.0-35.9, adult: Secondary | ICD-10-CM | POA: Diagnosis not present

## 2023-10-26 DIAGNOSIS — E782 Mixed hyperlipidemia: Secondary | ICD-10-CM | POA: Diagnosis not present

## 2023-11-14 ENCOUNTER — Other Ambulatory Visit: Payer: Self-pay | Admitting: *Deleted

## 2023-11-14 ENCOUNTER — Encounter: Payer: Self-pay | Admitting: Internal Medicine

## 2023-11-14 ENCOUNTER — Ambulatory Visit (INDEPENDENT_AMBULATORY_CARE_PROVIDER_SITE_OTHER): Admitting: Internal Medicine

## 2023-11-14 VITALS — BP 133/86 | HR 83 | Temp 98.8°F | Ht 64.0 in | Wt 192.6 lb

## 2023-11-14 DIAGNOSIS — R110 Nausea: Secondary | ICD-10-CM | POA: Diagnosis not present

## 2023-11-14 DIAGNOSIS — R1013 Epigastric pain: Secondary | ICD-10-CM

## 2023-11-14 DIAGNOSIS — R7989 Other specified abnormal findings of blood chemistry: Secondary | ICD-10-CM

## 2023-11-14 NOTE — Patient Instructions (Signed)
 Nice to see you again today  Further evaluation of your symptoms are needed  Get a gallbladder ultrasound to see if there is any evidence of inflammation.  Repeat LFTs today  If the gallbladder ultrasound does not give us  an answer, we will do another repeat endoscopy to look at your esophagus and stomach  Further recommendations to follow.

## 2023-11-14 NOTE — H&P (View-Only) (Signed)
 Gastroenterology Progress Note    Primary Care Physician:  Atilano Deward ORN, MD Primary Gastroenterologist:  Dr. Shaaron  Pre-Procedure History & Physical: HPI:  [name] Harper is a 64 y.o. male here for 10-week history of epigastric pain and nausea gets a sensation he needs to throw up but cannot this is postprandial he has good bowel function he is able to eat he does have a history of a fundoplication which is held up well since 2020 he really does not have any other reflux symptoms he had previously has not improved with Protonix  and Zofran  recently.  He has had 3 CT scans this year.  No significant findings although 1 earlier suggested mild jejunitis(not borne out on subsequent CTs).  He has not had a gallbladder ultrasound his gallbladder remains in situ.  He has not had a EGD since around the time of his fundoplication 5 years ago. Nonspecific bump in LFTs earlier this year.  Binge beer consumption.  None since he was sick.  Past Medical History:  Diagnosis Date   Anxiety    Arthritis    Bronchitis    Complication of anesthesia    Depression    Gastritis    GERD (gastroesophageal reflux disease)    Hypercholesterolemia    Hypertension    PONV (postoperative nausea and vomiting)    Pre-diabetes    pt stated    Past Surgical History:  Procedure Laterality Date   ABDOMINAL SURGERY  2007   hiatal hernia repair   BIOPSY  03/28/2017   Procedure: BIOPSY;  Surgeon: Harvey Margo CROME, MD;  Location: [facility];  Service: Endoscopy;;  gastric bx's and gastric polyp   COLONOSCOPY WITH PROPOFOL  N/A 03/28/2017   Dr. harvey: Few small and large mouth diverticula in the rectosigmoid colon, sigmoid colon, descending colon.  External and internal hemorrhoids noted.  One descending colon polyp removed, pathology benign.  Return colonoscopy in 10 years.   ESOPHAGEAL MANOMETRY N/A 10/10/2018   Procedure: ESOPHAGEAL MANOMETRY (EM);  Surgeon: Shila Gustav GAILS, MD;  Location: [facility];   Service: Endoscopy;  Laterality: N/A;   ESOPHAGOGASTRODUODENOSCOPY (EGD) WITH PROPOFOL  N/A 03/28/2017   Dr. harvey: Patchy mild inflammation and erythema in the gastric antrum, negative H. pylori on biopsies.  On biopsy he had chronic gastritis, negative for dysplasia.     ESOPHAGOGASTRODUODENOSCOPY (EGD) WITH PROPOFOL  N/A 09/24/2018   Procedure: ESOPHAGOGASTRODUODENOSCOPY (EGD) WITH PROPOFOL ;  Surgeon: Shaaron Lamar HERO, MD;  Location: [facility];  Service: Endoscopy;  Laterality: N/A;  2:45pm   KNEE ARTHROSCOPY  2016   rt knee   PH IMPEDANCE STUDY N/A 10/10/2018   Procedure: PH IMPEDANCE STUDY;  Surgeon: Shila Gustav GAILS, MD;  Location: [facility];  Service: Endoscopy;  Laterality: N/A;   POLYPECTOMY  03/28/2017   Procedure: POLYPECTOMY;  Surgeon: Harvey Margo CROME, MD;  Location: [facility];  Service: Endoscopy;;  descending colon polyp   TOTAL KNEE ARTHROPLASTY Right 10/29/2014   Procedure: RIGHT TOTAL KNEE ARTHROPLASTY;  Surgeon: Tanda Heading, MD;  Location: [facility];  Service: Orthopedics;  Laterality: Right;    Prior to Admission medications   Medication Sig Start Date End Date Taking? Authorizing Provider  acetaminophen  (TYLENOL ) 325 MG tablet Take 2 tablets (650 mg total) by mouth every 6 (six) hours as needed for mild pain (pain score 1-3) (or Fever >/= 101). 05/20/23  Yes Shahmehdi, Seyed A, MD  amLODipine  (NORVASC ) 5 MG tablet Take 1 tablet (5 mg total) by mouth daily. 05/20/23  05/19/24 Yes Shahmehdi, Adriana LABOR, MD  Azelastine  HCl 137 MCG/SPRAY SOLN PLACE 2 SPRAYS INTO BOTH NOSTRILS 2 (TWO) TIMES DAILY AS NEEDED. 09/11/23 11/14/23 Yes Iva Marty Saltness, MD  citalopram  (CELEXA ) 40 MG tablet Take 40 mg by mouth daily.   Yes [provider]  clonazePAM  (KLONOPIN ) 0.5 MG tablet Take 0.5 mg by mouth 3 (three) times daily as needed for anxiety. anxiety 02/10/14  Yes [provider]  EPINEPHrine  0.3 mg/0.3 mL IJ SOAJ injection Inject 0.3 mg into the muscle as needed for  anaphylaxis. USE AS DIRECTED FOR A SEVERE ALLERGIC REACTION. 03/11/22  Yes Iva Marty Saltness, MD  hydrochlorothiazide  (HYDRODIURIL ) 25 MG tablet  06/16/23  Yes [provider]  ipratropium (ATROVENT ) 0.06 % nasal spray Place 2 sprays into both nostrils 2 (two) times daily as needed for rhinitis. 01/27/23  Yes Iva Marty Saltness, MD  levocetirizine (XYZAL ) 5 MG tablet Take 1 tablet (5 mg total) by mouth every evening. 01/27/23  Yes Iva Marty Saltness, MD  lisinopril (ZESTRIL) 10 MG tablet  06/21/23  Yes [provider]  mometasone  (NASONEX ) 50 MCG/ACT nasal spray Place 2 sprays into the nose daily as needed. 01/27/23  Yes Iva Marty Saltness, MD  ondansetron  (ZOFRAN ) 4 MG tablet Take 1 tablet (4 mg total) by mouth every 6 (six) hours as needed for nausea or vomiting. 10/25/23  Yes Ezzard Sonny RAMAN, PA-C  pantoprazole  (PROTONIX ) 40 MG tablet Take 1 tablet (40 mg total) by mouth 2 (two) times daily before a meal. 10/18/23  Yes Ezzard Sonny RAMAN, PA-C  simvastatin  (ZOCOR ) 20 MG tablet Take 20 mg by mouth at bedtime.  11/12/13  Yes [provider]    Allergies as of 11/14/2023   (No Known Allergies)    Family History  Problem Relation Age of Onset   Lung disease Mother    Colon cancer Neg Hx     Social History   Socioeconomic History   Marital status: Widowed    Spouse name: Not on file   Number of children: Not on file   Years of education: Not on file   Highest education level: Not on file  Occupational History   Not on file  Tobacco Use   Smoking status: Never   Smokeless tobacco: Never  Vaping Use   Vaping status: Never Used  Substance and Sexual Activity   Alcohol  use: Not Currently    Alcohol /week: 6.0 standard drinks of alcohol     Types: 6 Cans of beer per week    Comment: remote heavy use over 20 years ago. recently drink excessively for 3 days (30 beers) in 07/2023   Drug use: No   Sexual activity: Yes    Birth control/protection: None  Other  Topics Concern   Not on file  Social History Narrative   Not on file   Social Drivers of Health   Financial Resource Strain: Not on file  Food Insecurity: No Food Insecurity (05/18/2023)   Hunger Vital Sign    Worried About Running Out of Food in the Last Year: Never true    Ran Out of Food in the Last Year: Never true  Transportation Needs: No Transportation Needs (05/18/2023)   PRAPARE - Administrator, Civil Service (Medical): No    Lack of Transportation (Non-Medical): No  Physical Activity: Not on file  Stress: Not on file  Social Connections: Moderately Isolated (05/18/2023)   Social Connection and Isolation Panel    Frequency of Communication with Friends  and Family: Once a week    Frequency of Social Gatherings with Friends and Family: Once a week    Attends Religious Services: 1 to 4 times per year    Active Member of [employer] or Organizations: Not on file    Attends Banker Meetings: 1 to 4 times per year    Marital Status: Widowed  Intimate Partner Violence: Not At Risk (05/18/2023)   Humiliation, Afraid, Rape, and Kick questionnaire    Fear of Current or Ex-Partner: No    Emotionally Abused: No    Physically Abused: No    Sexually Abused: No    Review of Systems   See HPI, otherwise negative ROS  Physical Exam: BP 133/86 (BP Location: Right Arm, Patient Position: Sitting, Cuff Size: Normal)   Pulse 83   Temp 98.8 F (37.1 C) (Oral)   Ht 5' 4 (1.626 m)   Wt 192 lb 9.6 oz (87.4 kg)   SpO2 96%   BMI 33.06 kg/m  General:   Alert,  Well-developed, well-nourished, pleasant and cooperative in NAD Neck:  Supple; no masses or thyromegaly. No significant cervical adenopathy. Lungs:  Clear throughout to auscultation.   No wheezes, crackles, or rhonchi. No acute distress. Heart:  Regular rate and rhythm; no murmurs, clicks, rubs,  or gallops. Abdomen: Obese.  Positive bowel sounds he does have epigastric and medial right upper quadrant abdominal  tenderness without appreciable mass organomegaly pulses:  Normal pulses noted. Extremities:  Without clubbing or edema.   Impression/Plan:    63 year old gentleman with a 10-week history of nausea upper abdominal pain pressure.  No odynophagia or dysphagia.  CTs negative.  Has not had scan of his gallbladder which remains in situ.  Further evaluation of your symptoms are needed  Get a gallbladder ultrasound to see if there is any evidence of inflammation.  Repeat LFTs today  If the gallbladder ultrasound does not give us  an answer, we will do another repeat endoscopy to look at your esophagus and stomach  Further recommendations to follow.      Notice: This dictation was prepared with Dragon dictation along with smaller phrase technology. Any transcriptional errors that result from this process are unintentional and may not be corrected upon review.

## 2023-11-14 NOTE — Progress Notes (Unsigned)
 Gastroenterology Progress Note    Primary Care Physician:  Atilano Deward ORN, MD Primary Gastroenterologist:  Dr. Shaaron  Pre-Procedure History & Physical: HPI:  [name] Harper is a 63 y.o. male here for 10-week history of epigastric pain and nausea gets a sensation he needs to throw up but cannot this is postprandial he has good bowel function he is able to eat he does have a history of a fundoplication which is held up well since 2020 he really does not have any other reflux symptoms he had previously has not improved with Protonix  and Zofran  recently.  He has had 3 CT scans this year.  No significant findings although 1 earlier suggested mild jejunitis(not borne out on subsequent CTs).  He has not had a gallbladder ultrasound his gallbladder remains in situ.  He has not had a EGD since around the time of his fundoplication 5 years ago. Nonspecific bump in LFTs earlier this year.  Binge beer consumption.  None since he was sick.  Past Medical History:  Diagnosis Date   Anxiety    Arthritis    Bronchitis    Complication of anesthesia    Depression    Gastritis    GERD (gastroesophageal reflux disease)    Hypercholesterolemia    Hypertension    PONV (postoperative nausea and vomiting)    Pre-diabetes    pt stated    Past Surgical History:  Procedure Laterality Date   ABDOMINAL SURGERY  2007   hiatal hernia repair   BIOPSY  03/28/2017   Procedure: BIOPSY;  Surgeon: Harvey Margo CROME, MD;  Location: [facility];  Service: Endoscopy;;  gastric bx's and gastric polyp   COLONOSCOPY WITH PROPOFOL  N/A 03/28/2017   Dr. harvey: Few small and large mouth diverticula in the rectosigmoid colon, sigmoid colon, descending colon.  External and internal hemorrhoids noted.  One descending colon polyp removed, pathology benign.  Return colonoscopy in 10 years.   ESOPHAGEAL MANOMETRY N/A 10/10/2018   Procedure: ESOPHAGEAL MANOMETRY (EM);  Surgeon: Shila Gustav GAILS, MD;  Location: [facility];   Service: Endoscopy;  Laterality: N/A;   ESOPHAGOGASTRODUODENOSCOPY (EGD) WITH PROPOFOL  N/A 03/28/2017   Dr. harvey: Patchy mild inflammation and erythema in the gastric antrum, negative H. pylori on biopsies.  On biopsy he had chronic gastritis, negative for dysplasia.     ESOPHAGOGASTRODUODENOSCOPY (EGD) WITH PROPOFOL  N/A 09/24/2018   Procedure: ESOPHAGOGASTRODUODENOSCOPY (EGD) WITH PROPOFOL ;  Surgeon: Shaaron Lamar HERO, MD;  Location: [facility];  Service: Endoscopy;  Laterality: N/A;  2:45pm   KNEE ARTHROSCOPY  2016   rt knee   PH IMPEDANCE STUDY N/A 10/10/2018   Procedure: PH IMPEDANCE STUDY;  Surgeon: Shila Gustav GAILS, MD;  Location: [facility];  Service: Endoscopy;  Laterality: N/A;   POLYPECTOMY  03/28/2017   Procedure: POLYPECTOMY;  Surgeon: Harvey Margo CROME, MD;  Location: [facility];  Service: Endoscopy;;  descending colon polyp   TOTAL KNEE ARTHROPLASTY Right 10/29/2014   Procedure: RIGHT TOTAL KNEE ARTHROPLASTY;  Surgeon: Tanda Heading, MD;  Location: [facility];  Service: Orthopedics;  Laterality: Right;    Prior to Admission medications   Medication Sig Start Date End Date Taking? Authorizing Provider  acetaminophen  (TYLENOL ) 325 MG tablet Take 2 tablets (650 mg total) by mouth every 6 (six) hours as needed for mild pain (pain score 1-3) (or Fever >/= 101). 05/20/23  Yes Shahmehdi, Seyed A, MD  amLODipine  (NORVASC ) 5 MG tablet Take 1 tablet (5 mg total) by mouth daily. 05/20/23  05/19/24 Yes Shahmehdi, Adriana LABOR, MD  Azelastine  HCl 137 MCG/SPRAY SOLN PLACE 2 SPRAYS INTO BOTH NOSTRILS 2 (TWO) TIMES DAILY AS NEEDED. 09/11/23 11/14/23 Yes Iva Marty Saltness, MD  citalopram  (CELEXA ) 40 MG tablet Take 40 mg by mouth daily.   Yes [provider]  clonazePAM  (KLONOPIN ) 0.5 MG tablet Take 0.5 mg by mouth 3 (three) times daily as needed for anxiety. anxiety 02/10/14  Yes [provider]  EPINEPHrine  0.3 mg/0.3 mL IJ SOAJ injection Inject 0.3 mg into the muscle as needed for  anaphylaxis. USE AS DIRECTED FOR A SEVERE ALLERGIC REACTION. 03/11/22  Yes Iva Marty Saltness, MD  hydrochlorothiazide  (HYDRODIURIL ) 25 MG tablet  06/16/23  Yes [provider]  ipratropium (ATROVENT ) 0.06 % nasal spray Place 2 sprays into both nostrils 2 (two) times daily as needed for rhinitis. 01/27/23  Yes Iva Marty Saltness, MD  levocetirizine (XYZAL ) 5 MG tablet Take 1 tablet (5 mg total) by mouth every evening. 01/27/23  Yes Iva Marty Saltness, MD  lisinopril (ZESTRIL) 10 MG tablet  06/21/23  Yes [provider]  mometasone  (NASONEX ) 50 MCG/ACT nasal spray Place 2 sprays into the nose daily as needed. 01/27/23  Yes Iva Marty Saltness, MD  ondansetron  (ZOFRAN ) 4 MG tablet Take 1 tablet (4 mg total) by mouth every 6 (six) hours as needed for nausea or vomiting. 10/25/23  Yes Ezzard Sonny RAMAN, PA-C  pantoprazole  (PROTONIX ) 40 MG tablet Take 1 tablet (40 mg total) by mouth 2 (two) times daily before a meal. 10/18/23  Yes Ezzard Sonny RAMAN, PA-C  simvastatin  (ZOCOR ) 20 MG tablet Take 20 mg by mouth at bedtime.  11/12/13  Yes [provider]    Allergies as of 11/14/2023   (No Known Allergies)    Family History  Problem Relation Age of Onset   Lung disease Mother    Colon cancer Neg Hx     Social History   Socioeconomic History   Marital status: Widowed    Spouse name: Not on file   Number of children: Not on file   Years of education: Not on file   Highest education level: Not on file  Occupational History   Not on file  Tobacco Use   Smoking status: Never   Smokeless tobacco: Never  Vaping Use   Vaping status: Never Used  Substance and Sexual Activity   Alcohol  use: Not Currently    Alcohol /week: 6.0 standard drinks of alcohol     Types: 6 Cans of beer per week    Comment: remote heavy use over 20 years ago. recently drink excessively for 3 days (30 beers) in 07/2023   Drug use: No   Sexual activity: Yes    Birth control/protection: None  Other  Topics Concern   Not on file  Social History Narrative   Not on file   Social Drivers of Health   Financial Resource Strain: Not on file  Food Insecurity: No Food Insecurity (05/18/2023)   Hunger Vital Sign    Worried About Running Out of Food in the Last Year: Never true    Ran Out of Food in the Last Year: Never true  Transportation Needs: No Transportation Needs (05/18/2023)   PRAPARE - Administrator, Civil Service (Medical): No    Lack of Transportation (Non-Medical): No  Physical Activity: Not on file  Stress: Not on file  Social Connections: Moderately Isolated (05/18/2023)   Social Connection and Isolation Panel    Frequency of Communication with Friends  and Family: Once a week    Frequency of Social Gatherings with Friends and Family: Once a week    Attends Religious Services: 1 to 4 times per year    Active Member of [employer] or Organizations: Not on file    Attends Banker Meetings: 1 to 4 times per year    Marital Status: Widowed  Intimate Partner Violence: Not At Risk (05/18/2023)   Humiliation, Afraid, Rape, and Kick questionnaire    Fear of Current or Ex-Partner: No    Emotionally Abused: No    Physically Abused: No    Sexually Abused: No    Review of Systems   See HPI, otherwise negative ROS  Physical Exam: BP 133/86 (BP Location: Right Arm, Patient Position: Sitting, Cuff Size: Normal)   Pulse 83   Temp 98.8 F (37.1 C) (Oral)   Ht 5' 4 (1.626 m)   Wt 192 lb 9.6 oz (87.4 kg)   SpO2 96%   BMI 33.06 kg/m  General:   Alert,  Well-developed, well-nourished, pleasant and cooperative in NAD Neck:  Supple; no masses or thyromegaly. No significant cervical adenopathy. Lungs:  Clear throughout to auscultation.   No wheezes, crackles, or rhonchi. No acute distress. Heart:  Regular rate and rhythm; no murmurs, clicks, rubs,  or gallops. Abdomen: Obese.  Positive bowel sounds he does have epigastric and medial right upper quadrant abdominal  tenderness without appreciable mass organomegaly pulses:  Normal pulses noted. Extremities:  Without clubbing or edema.   Impression/Plan:       Notice: This dictation was prepared with Dragon dictation along with smaller phrase technology. Any transcriptional errors that result from this process are unintentional and may not be corrected upon review.

## 2023-11-15 ENCOUNTER — Ambulatory Visit (HOSPITAL_COMMUNITY)
Admission: RE | Admit: 2023-11-15 | Discharge: 2023-11-15 | Disposition: A | Discharge status: Home / Self Care | Source: Ambulatory Visit | Attending: Internal Medicine | Admitting: Internal Medicine

## 2023-11-15 ENCOUNTER — Ambulatory Visit: Payer: Self-pay | Admitting: Internal Medicine

## 2023-11-15 DIAGNOSIS — R1013 Epigastric pain: Secondary | ICD-10-CM

## 2023-11-15 DIAGNOSIS — R110 Nausea: Secondary | ICD-10-CM | POA: Diagnosis not present

## 2023-11-17 DIAGNOSIS — Z6832 Body mass index (BMI) 32.0-32.9, adult: Secondary | ICD-10-CM | POA: Diagnosis not present

## 2023-11-17 DIAGNOSIS — Z008 Encounter for other general examination: Secondary | ICD-10-CM | POA: Diagnosis not present

## 2023-11-17 DIAGNOSIS — E669 Obesity, unspecified: Secondary | ICD-10-CM | POA: Diagnosis not present

## 2023-11-22 ENCOUNTER — Ambulatory Visit: Payer: Self-pay | Admitting: Internal Medicine

## 2023-11-22 ENCOUNTER — Ambulatory Visit (HOSPITAL_COMMUNITY)
Admission: RE | Admit: 2023-11-22 | Discharge: 2023-11-22 | Disposition: A | Discharge status: Home / Self Care | Source: Ambulatory Visit | Attending: Internal Medicine | Admitting: Internal Medicine

## 2023-11-22 DIAGNOSIS — R1013 Epigastric pain: Secondary | ICD-10-CM | POA: Insufficient documentation

## 2023-11-22 DIAGNOSIS — R110 Nausea: Secondary | ICD-10-CM | POA: Insufficient documentation

## 2023-11-22 MED ORDER — TECHNETIUM TC 99M MEBROFENIN IV KIT
5.0000 | PACK | Freq: Once | INTRAVENOUS | Status: AC | PRN
  Administered 2023-11-22: 4.8 via INTRAVENOUS

## 2023-11-23 ENCOUNTER — Encounter: Payer: Self-pay | Admitting: *Deleted

## 2023-11-30 ENCOUNTER — Ambulatory Visit (HOSPITAL_COMMUNITY)

## 2023-11-30 ENCOUNTER — Encounter (HOSPITAL_COMMUNITY)
Admission: RE | Disposition: A | Discharge status: Home / Self Care | Payer: Self-pay | Source: Home / Self Care | Attending: Internal Medicine

## 2023-11-30 ENCOUNTER — Ambulatory Visit (HOSPITAL_COMMUNITY)
Admission: RE | Admit: 2023-11-30 | Discharge: 2023-11-30 | Disposition: A | Discharge status: Home / Self Care | Attending: Internal Medicine | Admitting: Internal Medicine

## 2023-11-30 ENCOUNTER — Other Ambulatory Visit: Payer: Self-pay

## 2023-11-30 ENCOUNTER — Encounter (HOSPITAL_COMMUNITY): Payer: Self-pay | Admitting: Internal Medicine

## 2023-11-30 ENCOUNTER — Telehealth: Payer: Self-pay

## 2023-11-30 DIAGNOSIS — F418 Other specified anxiety disorders: Secondary | ICD-10-CM

## 2023-11-30 DIAGNOSIS — K2100 Gastro-esophageal reflux disease with esophagitis, without bleeding: Secondary | ICD-10-CM | POA: Insufficient documentation

## 2023-11-30 DIAGNOSIS — I10 Essential (primary) hypertension: Secondary | ICD-10-CM

## 2023-11-30 DIAGNOSIS — R110 Nausea: Secondary | ICD-10-CM | POA: Insufficient documentation

## 2023-11-30 DIAGNOSIS — Z9889 Other specified postprocedural states: Secondary | ICD-10-CM

## 2023-11-30 DIAGNOSIS — K219 Gastro-esophageal reflux disease without esophagitis: Secondary | ICD-10-CM

## 2023-11-30 HISTORY — PX: ESOPHAGOGASTRODUODENOSCOPY: SHX5428

## 2023-11-30 SURGERY — EGD (ESOPHAGOGASTRODUODENOSCOPY)
Anesthesia: General

## 2023-11-30 MED ORDER — PROPOFOL 10 MG/ML IV BOLUS
INTRAVENOUS | Status: DC | PRN
  Administered 2023-11-30: 100 mg via INTRAVENOUS
  Administered 2023-11-30: 150 ug/kg/min via INTRAVENOUS

## 2023-11-30 MED ORDER — VOQUEZNA 20 MG PO TABS: 20.0000 mg | ORAL_TABLET | Freq: Every day | ORAL | Status: DC

## 2023-11-30 MED ORDER — LACTATED RINGERS IV SOLN: INTRAVENOUS | Status: DC | PRN

## 2023-11-30 MED ORDER — LIDOCAINE HCL (CARDIAC) PF 100 MG/5ML IV SOSY
PREFILLED_SYRINGE | INTRAVENOUS | Status: DC | PRN
  Administered 2023-11-30: 100 mg via INTRAVENOUS

## 2023-11-30 NOTE — Discharge Instructions (Addendum)
 EGD Discharge instructions Please read the instructions outlined below and refer to this sheet in the next few weeks. These discharge instructions provide you with general information on caring for yourself after you leave the hospital. Your doctor may also give you specific instructions. While your treatment has been planned according to the most current medical practices available, unavoidable complications occasionally occur. If you have any problems or questions after discharge, please call your doctor. ACTIVITY You may resume your regular activity but move at a slower pace for the next 24 hours.  Take frequent rest periods for the next 24 hours.  Walking will help expel (get rid of) the air and reduce the bloated feeling in your abdomen.  No driving for 24 hours (because of the anesthesia (medicine) used during the test).  You may shower.  Do not sign any important legal documents or operate any machinery for 24 hours (because of the anesthesia used during the test).  NUTRITION Drink plenty of fluids.  You may resume your normal diet.  Begin with a light meal and progress to your normal diet.  Avoid alcoholic beverages for 24 hours or as instructed by your caregiver.  MEDICATIONS You may resume your normal medications unless your caregiver tells you otherwise.  WHAT YOU CAN EXPECT TODAY You may experience abdominal discomfort such as a feeling of fullness or "gas" pains.  FOLLOW-UP Your doctor will discuss the results of your test with you.  SEEK IMMEDIATE MEDICAL ATTENTION IF ANY OF THE FOLLOWING OCCUR: Excessive nausea (feeling sick to your stomach) and/or vomiting.  Severe abdominal pain and distention (swelling).  Trouble swallowing.  Temperature over 101 F (37.8 C).  Rectal bleeding or vomiting of blood.     You do have some acid reflux changes in your esophagus your wrap done at [facility] is still intact no ulcer or other abnormality.    stop Protonix ; go to my office when  you leave here.  I am starting a new medicine for acid reflux called Voquenza 20 mg tablet.  Take 20 mg every day for 2 weeks and let me know how you are doing at the end of 2 weeks.  You are not much improved, we will need to consider further evaluation of your small intestine as a next step in workup    continue Zofran  4 mg tablets.  Take 1 every 8 hours as needed for nausea.  My office is calling a new prescription to your drugstore.    Let me know in 2 weeks how you are doing.    Office visit with Sonny Kerns in 4 to 6 weeks

## 2023-11-30 NOTE — Op Note (Signed)
 [facility] Patient Name: [name] Harper Procedure Date: 11/30/2023 1:39 PM MRN: [id number] Date of Birth: 08/11/1960 Attending MD: Lamar Ozell Hollingshead , MD, [id number] CSN: [id number] Age: 63 Admit Type: Outpatient Procedure:                Upper GI endoscopy Indications:              Nausea?"postprandial. Refractory to PPI therapy.                            Negative gallbladder ultrasound and HIDA with EF Providers:                Lamar Ozell Hollingshead, MD, Crystal Page, Bascom Blush Referring MD:              Medicines:                Propofol  per Anesthesia Complications:            No immediate complications. Estimated Blood Loss:     Estimated blood loss was minimal. Procedure:                Pre-Anesthesia Assessment:                           - Prior to the procedure, a History and Physical                            was performed, and patient medications and                            allergies were reviewed. The patient's tolerance of                            previous anesthesia was also reviewed. The risks                            and benefits of the procedure and the sedation                            options and risks were discussed with the patient.                            All questions were answered, and informed consent                            was obtained. Prior Anticoagulants: The patient has                            taken no anticoagulant or antiplatelet agents. ASA                            Grade Assessment: III - A patient with severe                            systemic disease. After reviewing the risks and  benefits, the patient was deemed in satisfactory                            condition to undergo the procedure.                           After obtaining informed consent, the endoscope was                            passed under direct vision. Throughout the                            procedure, the patient's  blood pressure, pulse, and                            oxygen saturations were monitored continuously. The                            [id number] ([id number]) Upper was introduced through                            the mouth, and advanced to the second part of                            duodenum. Scope In: 2:12:38 PM Scope Out: 2:15:31 PM Total Procedure Duration: 0 hours 2 minutes 53 seconds  Findings:      He has esophageal erosions at the GE junction. They straddle the GE       junction. No Barrett's epithelium no plaques mass or other abnormality.      The entire examined stomach was normal aside from an intact       fundoplication. Pylorus patent patent.      The duodenal bulb and second portion of the duodenum were normal. Impression:               - Mild erosive reflux esophagitis. Intact                            fundoplication. Otherwise, normal stomach.                           - Normal duodenal bulb and second portion of the                            duodenum.                           - No specimens collected.                           - 47-month history of postprandial nausea without                            vomiting which continues to bother him quite a bit.  He takes Zofran  with some improvement. No abdominal                            pain. HIDA with a EF and ultrasound negative for                            gallbladder etiology. He does have some mild reflux                            esophagitis but he has an intact fundoplication. I                            would expect improvement with high-dose PPI                            therapy. He could be a PPI nonresponder. Previously                            noted to have jejunal wall thickening on CT but                            that was not present on subsequent scanning. When                            he does not eat he does not have nausea. Moderate Sedation:      Moderate (conscious)  sedation was personally administered by an       anesthesia professional. The following parameters were monitored: oxygen       saturation, heart rate, blood pressure, respiratory rate, EKG, adequacy       of pulmonary ventilation, and response to care. Recommendation:           - Patient has a contact number available for                            emergencies. The signs and symptoms of potential                            delayed complications were discussed with the                            patient. Return to normal activities tomorrow.                            Written discharge instructions were provided to the                            patient.                           - Advance diet as tolerated.                           - Continue present medications.                           -  Return to my office in 6 weeks. Trial of Vozuenza                            20 mg daily x 2 weeks. If no improvement will                            consider solid-phase gastric emptying study versus                            further imaging of the small bowel. Procedure Code(s):        --- Professional ---                           [id number], Esophagogastroduodenoscopy, flexible,                            transoral; diagnostic, including collection of                            specimen(s) by brushing or washing, when performed                            (separate procedure) Diagnosis Code(s):        --- Professional ---                           R11.0, Nausea CPT copyright 2022 American Medical Association. All rights reserved. The codes documented in this report are preliminary and upon coder review may  be revised to meet current compliance requirements. Lamar HERO. Mireille Lacombe, MD Lamar Ozell Hollingshead, MD 11/30/2023 2:35:09 PM This report has been signed electronically. Number of Addenda: 0

## 2023-11-30 NOTE — Telephone Encounter (Signed)
-----   Message from Lamar Hollingshead sent at 11/30/2023  2:25 PM EDT -----  patient coming by for a Voquenza 20 mg tablets.  Please give him 2 weeks worth.    he will call in 2 weeks and let us  know how he is doing   new prescription for Zofran  4 mg ODT tablets.  Dispense 40 with 3 refills.  Take 1 up to 3 times a day as needed for nausea

## 2023-11-30 NOTE — Interval H&P Note (Signed)
 History and Physical Interval Note:  11/30/2023 2:07 PM  [name] Harper  has presented today for surgery, with the diagnosis of abd pain.  The various methods of treatment have been discussed with the patient and family. After consideration of risks, benefits and other options for treatment, the patient has consented to  Procedure(s) with comments: EGD (ESOPHAGOGASTRODUODENOSCOPY) (N/A) - 215pm, asa 2 as a surgical intervention.  The patient's history has been reviewed, patient examined, no change in status, stable for surgery.  I have reviewed the patient's chart and labs.  Questions were answered to the patient's satisfaction.     [name] Harper    HIDA negative, ultrasound negative.  Denies dysphagia.  He really 0 he was then on more postprandial nausea than anything else.  History of antireflux surgery.  Zofran  helps.  Diagnostic EGD today per plan.  The risks, benefits, limitations, alternatives and imponderables have been reviewed with the patient. Potential for esophageal dilation, biopsy, etc. have also been reviewed.  Questions have been answered. All parties agreeable.

## 2023-11-30 NOTE — Transfer of Care (Signed)
 Immediate Anesthesia Transfer of Care Note  Patient: [name] Harper  Procedure(s) Performed: EGD (ESOPHAGOGASTRODUODENOSCOPY)  Patient Location: Endoscopy Unit  Anesthesia Type:General  Level of Consciousness: drowsy, patient cooperative, and responds to stimulation  Airway & Oxygen Therapy: Patient Spontanous Breathing  Post-op Assessment: Report given to RN and Post -op Vital signs reviewed and stable  Post vital signs: Reviewed and stable  Last Vitals:  Vitals Value Taken Time  BP 91/65 11/30/23 14:18  Temp 36.9 C 11/30/23 14:18  Pulse 73 11/30/23 14:18  Resp 19 11/30/23 14:18  SpO2 94 % 11/30/23 14:18    Last Pain:  Vitals:   11/30/23 1418  TempSrc: Oral  PainSc:          Complications: No notable events documented.

## 2023-11-30 NOTE — Telephone Encounter (Signed)
 Medication Samples have been provided to the patient.  Drug name: Voquezna       Strength: 20 mg        Qty: 3  LOT: [id number]  Exp.Date: 12/21/2024  Dosing instructions: Take 1 tablet daily  The patient has been instructed regarding the correct time, dose, and frequency of taking this medication, including desired effects and most common side effects.   [name] Harper 2:51 PM 11/30/2023

## 2023-11-30 NOTE — Anesthesia Preprocedure Evaluation (Addendum)
 Anesthesia Evaluation  Patient identified by MRN, date of birth, ID band Patient awake    Reviewed: Allergy  & Precautions, H&P , NPO status , Patient's Chart, lab work & pertinent test results  History of Anesthesia Complications (+) PONV and history of anesthetic complications  Airway Mallampati: III  TM Distance: >3 FB Neck ROM: Full    Dental no notable dental hx.    Pulmonary neg pulmonary ROS   Pulmonary exam normal breath sounds clear to auscultation       Cardiovascular hypertension, Normal cardiovascular exam Rhythm:Regular Rate:Normal     Neuro/Psych  PSYCHIATRIC DISORDERS Anxiety Depression    negative neurological ROS     GI/Hepatic Neg liver ROS,GERD  ,,  Endo/Other  negative endocrine ROS    Renal/GU negative Renal ROS  negative genitourinary   Musculoskeletal  (+) Arthritis ,    Abdominal   Peds negative pediatric ROS (+)  Hematology negative hematology ROS (+)   Anesthesia Other Findings   Reproductive/Obstetrics negative OB ROS                              Anesthesia Physical Anesthesia Plan  ASA: 2  Anesthesia Plan: General   Post-op Pain Management:    Induction: Intravenous  PONV Risk Score and Plan:   Airway Management Planned: Nasal Cannula  Additional Equipment:   Intra-op Plan:   Post-operative Plan:   Informed Consent: I have reviewed the patients History and Physical, chart, labs and discussed the procedure including the risks, benefits and alternatives for the proposed anesthesia with the patient or authorized representative who has indicated his/her understanding and acceptance.     Dental advisory given  Plan Discussed with: CRNA  Anesthesia Plan Comments:          Anesthesia Quick Evaluation

## 2023-12-01 NOTE — Anesthesia Postprocedure Evaluation (Signed)
 Anesthesia Post Note  Patient: Ether A Winecoff  Procedure(s) Performed: EGD (ESOPHAGOGASTRODUODENOSCOPY)  Patient location during evaluation: PACU Anesthesia Type: General Level of consciousness: awake and alert Pain management: pain level controlled Vital Signs Assessment: post-procedure vital signs reviewed and stable Respiratory status: spontaneous breathing, nonlabored ventilation, respiratory function stable and patient connected to nasal cannula oxygen Cardiovascular status: blood pressure returned to baseline and stable Postop Assessment: no apparent nausea or vomiting Anesthetic complications: no   No notable events documented.   Last Vitals:  Vitals:   11/30/23 1426 11/30/23 1430  BP: 92/60 97/65  Pulse: 85 80  Resp: 19 19  Temp:    SpO2: 95% 95%    Last Pain:  Vitals:   11/30/23 1426  TempSrc:   PainSc: 5                  Andrea Limes

## 2023-12-04 ENCOUNTER — Encounter (HOSPITAL_COMMUNITY): Payer: Self-pay | Admitting: Internal Medicine

## 2023-12-12 ENCOUNTER — Other Ambulatory Visit: Payer: Self-pay | Admitting: Internal Medicine

## 2023-12-12 ENCOUNTER — Telehealth: Payer: Self-pay

## 2023-12-12 ENCOUNTER — Other Ambulatory Visit: Payer: Self-pay

## 2023-12-12 MED ORDER — VOQUEZNA 20 MG PO TABS: 20.0000 mg | ORAL_TABLET | Freq: Every day | ORAL | 11 refills | Status: DC

## 2023-12-12 NOTE — Telephone Encounter (Signed)
 Rx sent to pharmacy on file. Pt made aware.

## 2023-12-12 NOTE — Telephone Encounter (Signed)
 Pt called stating that the Voquezna is working well for him and would like an Rx sent to his pharmacy.

## 2023-12-14 ENCOUNTER — Other Ambulatory Visit: Payer: Self-pay

## 2023-12-14 MED ORDER — VOQUEZNA 20 MG PO TABS: 20.0000 mg | ORAL_TABLET | Freq: Every day | ORAL | Status: AC

## 2023-12-14 NOTE — Progress Notes (Signed)
 Medication Samples have been provided to the patient.  Drug name: VOQUEZNA       Strength: 20 MG        Qty: 2  LOT: [id number]  Exp.Date: 12/21/2024  Dosing instructions: TAKE 1 TABLET DAILY  The patient has been instructed regarding the correct time, dose, and frequency of taking this medication, including desired effects and most common side effects.   [name] Harper 8:40 AM 12/14/2023

## 2023-12-14 NOTE — Telephone Encounter (Signed)
 PA for Voquezna was approved. Approval documents to be scanned under media. Pharmacy had to order medication, samples placed up front for pickup to get him to when pharmacy has it available.

## 2023-12-27 ENCOUNTER — Other Ambulatory Visit: Payer: Self-pay | Admitting: Allergy & Immunology

## 2024-01-19 ENCOUNTER — Other Ambulatory Visit: Payer: Self-pay | Admitting: Allergy & Immunology

## 2024-01-29 ENCOUNTER — Other Ambulatory Visit: Payer: Self-pay | Admitting: Allergy & Immunology

## 2024-01-29 ENCOUNTER — Encounter: Payer: Self-pay | Admitting: Gastroenterology

## 2024-01-29 ENCOUNTER — Ambulatory Visit: Admitting: Gastroenterology

## 2024-01-29 VITALS — BP 170/84 | HR 116 | Temp 99.7°F | Wt 198.4 lb

## 2024-01-29 DIAGNOSIS — K219 Gastro-esophageal reflux disease without esophagitis: Secondary | ICD-10-CM | POA: Diagnosis not present

## 2024-01-29 DIAGNOSIS — R6881 Early satiety: Secondary | ICD-10-CM | POA: Insufficient documentation

## 2024-01-29 DIAGNOSIS — R112 Nausea with vomiting, unspecified: Secondary | ICD-10-CM | POA: Diagnosis not present

## 2024-01-29 MED ORDER — ONDANSETRON HCL 4 MG PO TABS: 4.0000 mg | ORAL_TABLET | Freq: Four times a day (QID) | ORAL | 0 refills | Status: DC | PRN

## 2024-01-29 NOTE — Patient Instructions (Signed)
 Continue Voquezna  20mg  daily before breakfast.   I have sent in refills for your nausea medication (odansetron).   We will schedule you for a stomach emptying test to further evaluate your symptoms.   Try to avoid fatty foods/fried foods.   Eat small portions of food.  Allow 2 hours between eating/drinking and laying down.   Hold off on the Mexican pill you are taking to empty your stomach/bowels.

## 2024-01-29 NOTE — Progress Notes (Signed)
 GI Office Note    Referring Provider: Trudy Vaughn FALCON, MD Primary Care Physician:  Trudy Vaughn FALCON, MD  Primary Gastroenterologist: Ozell Hollingshead, MD  Chief Complaint   Chief Complaint  Patient presents with   Follow-up    States that the Voquezna  has helped a whole lot but 7 days ago he started to where he vomits flames first thing in the morning and feels nauseous the rest of the day.     History of Present Illness   [name] Harper is a 63 y.o. male presenting today for follow up. Last seen in the office 10/2023.   He has history of nausea and upper abdominal pain pressure starting back in July. He has history of anxiety/depression. He has fundoplication  remotely. History of hepatic steatosis.  He has had multiple ED visits. Symptoms seemed to have started after binge etoh use around his birthday in [birth year]. He has not consumed alcohol  in over 20 years. He was hospitalized in March with shiga like producing ecoli. Multiple CTs.   Discussed the use of AI scribe software for clinical note transcription with the patient, who gave verbal consent to proceed.  History of Present Illness [name] Harper is a 63 year old male with acid reflux who presents with nausea and vomiting.  States he was doing well since his EGD and starting Voquezna . However one week ago, he noticed episodes, waking up early between 5-6am with N/V. He is bringing up mucus. He denies abdominal pain. He notes he has chronic sinus issues and post nasal drip. He will feel something come up into his throat, this causes him to vomit which then causes him to cough. He has been using his zofran  due to nausea over the past week. This settles his nausea down and he is able to eat the rest of the day although notes early satiety and eats very small amounts.   He notes sometimes the vomiting occurs after breakfast and even though he vomits up the phlegm, no food comes up.   He denies heartburn. Voquezna  is controlling  that.     He denies any postprandial abdominal pain.   He occasionally takes a Mexican pill to 'clean out' his stomach/bowels, though he is unsure of its contents. He has used it more recently about 3 times per week. BMs are moving well. No melena, brbpr. No etoh. He drinks seven bottles of water  daily, occasionally consumes Diet [facility] (about twice per week), and avoids alcohol  and caffeine. He ensures not to lie down immediately after eating. Tries to wait 2 hours before laying down.     Prior Data     Results   RUQ U/S 10/2023: -mild gallbladder sludge. No stones or acute cholecystitis -minimal hepatic steatosis  HIDA 11/22/2023: -patent cystic and common bile ducts -GB EF of 95%, can be seen with gallbladder hyperkinesia.  CT abdomen pelvis with contrast, September 24, 2023  IMPRESSION: 1. No acute abnormality in the abdomen or pelvis. 2. Hepatic steatosis. 3. Colonic diverticulosis without acute bowel inflammation. 4. Small hiatal hernia. 5. Aortic Atherosclerosis (ICD10-I70.0).   CT abdomen pelvis with contrast, September 22, 2023 IMPRESSION: 1. No acute intra-abdominal or intrapelvic process. 2. Colonic diverticulosis without diverticulitis. 3. Hepatic steatosis. 4. Small hiatal hernia. 5. Stable fat containing left inguinal hernia. 6. Aortic Atherosclerosis (ICD10-I70.0).   CT abdomen pelvis with contrast, May 18, 2023  IMPRESSION: 1. Prominent wall thickening and mucosal enhancement of jejunal and proximal ileal small bowel  loops in the left upper quadrant with associated mesenteric congestion. Suspect infectious or inflammatory enteritis. Bowel is borderline dilated but no convincing obstruction on this exam. 2. Small volume free fluid in the upper abdomen but no free air 3. Diverticular disease of the left colon without acute inflammatory process. 4. Aortic atherosclerosis. Tested positive for shiga like toxin producing ecoli at that time    EGD  11/2023: -mild erosive reflux esophagitis. Intact fundoplication. Otherwise, normal stomach. -normal duodenal bulb and second portion of duodenum.   EGD August 2020: - Normal esophagus - Normal stomach.  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  No evidence of significant gastroesophageal acid or nonacid reflux events   Medications   Current Outpatient Medications  Medication Sig Dispense Refill   acetaminophen  (TYLENOL ) 325 MG tablet Take 2 tablets (650 mg total) by mouth every 6 (six) hours as needed for mild pain (pain score 1-3) (or Fever >/= 101). 30 tablet 0   amLODipine  (NORVASC ) 5 MG tablet Take 1 tablet (5 mg total) by mouth daily. 30 tablet 11   Azelastine  HCl 137 MCG/SPRAY SOLN PLACE 2 SPRAYS INTO BOTH NOSTRILS 2 (TWO) TIMES DAILY AS NEEDED. 90 mL 1   citalopram  (CELEXA ) 40 MG tablet Take 40 mg by mouth daily.     clonazePAM  (KLONOPIN ) 0.5 MG tablet Take 0.5 mg by mouth 3 (three) times daily as needed for anxiety. anxiety     EPINEPHrine  0.3 mg/0.3 mL IJ SOAJ injection Inject 0.3 mg into the muscle as needed for anaphylaxis. USE AS DIRECTED FOR A SEVERE ALLERGIC REACTION. 2 each 1   hydrochlorothiazide  (HYDRODIURIL ) 25 MG tablet      ipratropium (ATROVENT ) 0.06 % nasal spray PLACE 2 SPRAYS INTO BOTH NOSTRILS 2 (TWO) TIMES DAILY AS NEEDED FOR RHINITIS. 135 mL 1   levocetirizine  (XYZAL ) 5 MG tablet Take 1 tablet (5 mg total) by mouth every evening. 90 tablet 3   lisinopril (ZESTRIL) 10 MG tablet      mometasone  (NASONEX ) 50 MCG/ACT nasal spray Place 2 sprays into the nose daily as needed. 153 each 1   ondansetron  (ZOFRAN ) 4 MG tablet Take 1 tablet (4 mg total) by mouth every 6 (six) hours as needed for nausea or vomiting. 60 tablet 0   simvastatin  (ZOCOR ) 20 MG tablet Take 20 mg by mouth at bedtime.      Vonoprazan Fumarate  (VOQUEZNA ) 20 MG TABS Take 20 mg by mouth daily.     No current facility-administered medications for this visit.   Facility-Administered Medications Ordered in Other Visits  Medication Dose Route Frequency Provider Last Rate Last Admin   bupivacaine  liposome (EXPAREL ) 1.3 % injection 266 mg  20 mL Infiltration Once Porterfield, Amber, PA-C        Allergies   Allergies as of 01/29/2024   (No Known Allergies)        Review of Systems   General: Negative for anorexia, weight loss, fever, chills, fatigue, weakness. ENT: Negative for hoarseness, difficulty swallowing ,  nasal congestion. CV: Negative for chest pain, angina, palpitations, dyspnea on exertion, peripheral edema.  Respiratory: Negative for dyspnea at rest, dyspnea on exertion, cough, sputum, wheezing.  GI: See history of present illness. GU:  Negative for dysuria, hematuria, urinary incontinence, urinary frequency, nocturnal urination.  Endo: Negative for unusual weight change.     Physical Exam   BP (!) 170/84   Pulse (!) 116   Temp 99.7 F (37.6 C) (Oral)   Wt 198 lb 6.4 oz (90 kg)   SpO2 94%   BMI 38.75 kg/m    General: Well-nourished, well-developed in no acute distress.  Eyes: No icterus. Mouth: Oropharyngeal mucosa moist and pink   Lungs: Clear to auscultation bilaterally.  Heart: Regular rate and rhythm, no murmurs rubs or gallops.  Abdomen: Bowel sounds are normal, nontender, nondistended, no hepatosplenomegaly or masses,  no abdominal bruits or hernia , no  rebound or guarding.  Rectal: not performed Extremities: No lower extremity edema. No clubbing or deformities. Neuro: Alert and oriented x 4   Skin: Warm and dry, no jaundice.   Psych: Alert and cooperative, normal mood and affect.  Labs   See above Imaging Studies   No results found.  Assessment/Plan:    Assessment & Plan Recurrent nausea and vomiting Persisting for seven days, primarily in the morning, with episodes of vomiting white mucus-like substance. No heartburn on Voquezna . Nausea settles with ondansetron . Gallbladder work up with gb sludge and GB EF of 95%. He denies postprandial abdominal pain making GB less likely cause. Per Dr. Ivonne recommendations, we will move forward with GES. May require further small bowel imaging as well.  - Continue ondansetron  as needed for nausea. - Ordered gastric emptying study to assess gastric motility. - Advised discontinuation of Mexican pills until further evaluation.  Gastroesophageal reflux disease (GERD) GERD with previous remote fundoplication, intact at time of recent EGD.  Recently doing better on Voquezna  but one week history of vomiting. - Continue Voquezna  20mg  daily.  - Evaluate for delayed gastric emptying as suggested by Dr. Shaaron.       [name] Harper, MHS, PA-C [facility]

## 2024-01-30 ENCOUNTER — Telehealth: Payer: Self-pay | Admitting: *Deleted

## 2024-01-30 NOTE — Telephone Encounter (Signed)
 Pt informed that GES is scheduled for Tuesday 02/06/24, arrive at 7:45 am to check in at [facility]. Nothing to eat or drink after midnight prior to procedure and no stomach medications morning of procedure. Pt verbalized understanding.

## 2024-01-31 ENCOUNTER — Ambulatory Visit: Payer: Medicare HMO | Admitting: Allergy & Immunology

## 2024-02-02 ENCOUNTER — Other Ambulatory Visit: Payer: Self-pay

## 2024-02-02 ENCOUNTER — Encounter: Payer: Self-pay | Admitting: Family Medicine

## 2024-02-02 ENCOUNTER — Ambulatory Visit (INDEPENDENT_AMBULATORY_CARE_PROVIDER_SITE_OTHER): Admitting: Family Medicine

## 2024-02-02 VITALS — BP 124/76 | HR 76 | Temp 98.7°F | Ht 64.0 in | Wt 198.8 lb

## 2024-02-02 DIAGNOSIS — K219 Gastro-esophageal reflux disease without esophagitis: Secondary | ICD-10-CM

## 2024-02-02 DIAGNOSIS — J302 Other seasonal allergic rhinitis: Secondary | ICD-10-CM

## 2024-02-02 DIAGNOSIS — J3089 Other allergic rhinitis: Secondary | ICD-10-CM | POA: Diagnosis not present

## 2024-02-02 NOTE — Progress Notes (Signed)
 [address] [place] [address] Dept: [phone]  FOLLOW UP NOTE  Patient ID: [name] Harper, male    DOB: 11-14-1960  Age: 63 y.o. MRN: [id number] Date of Office Visit: 02/02/2024  Assessment  Chief Complaint: Seasonal and perennial allergic rhinitis and Follow-up (No issues today. He wants to be back on allergy  shot)  HPI [name] Harper is a 63 year old male who presents to the clinic for follow-up visit.  He was last seen in this clinic on 01/27/2023 by Dr. Iva for evaluation of allergic rhinitis, reflux, and oral lesions.  He began allergen immunotherapy in 2017. In the interim, he presented to ED in March for evaluation of intractable nausea and vomiting with hospital admission.   Discussed the use of AI scribe software for clinical note transcription with the patient, who gave verbal consent to proceed.  History of Present Illness WILIAM Harper is a 63 year old male who presents for continuation of allergy  shots.  He reports that after going to [place], he became ill with an unknown illness for about 3 months with symptoms including fever. He reports that his symptoms have resolved at this time. Allergic rhinitis is reported as moderately well controlled with occasional nasal symptoms. He continues a daily antihistamine and currently using Flonase  and antihistamine sprays daily with some relief of symptoms. He is interested in restarting allergen immunotherapy. His last injection directed toward grass pollen, tree pollen, cat, dog, ragweed, mold, cockroach, and dust mites was on 04/14/2023. He reports that he is getting a new insurance plan beginning in [place] 2026 and wanting to restart injections at that time.   He has a history of acid reflux and has been under the care of a gastroenterologist. He underwent tests as part of his evaluation for acid reflux. He experiences daily symptoms, including phlegm and mucus production, and occasional vomiting.   His  current medications are listed in the chart.  Chart review: Gastrointestinal Panel by PCR , Stool [[id number]] (Abnormal) Collected: 05/20/23 0435  Order Status: Completed Specimen: Stool Updated: 05/21/23 0857   Campylobacter species NOT DETECTED NOT DETECTED    Plesimonas shigelloides NOT DETECTED NOT DETECTED    Salmonella species NOT DETECTED NOT DETECTED    Yersinia enterocolitica NOT DETECTED NOT DETECTED    Vibrio species NOT DETECTED NOT DETECTED    Vibrio cholerae NOT DETECTED NOT DETECTED    Enteroaggregative E coli (EAEC) NOT DETECTED NOT DETECTED    Enterotoxigenic E coli (ETEC) NOT DETECTED NOT DETECTED    Shiga like toxin producing E coli (STEC) DETECTED Abnormal  NOT DETECTED    Comment: RESULT CALLED TO, READ BACK BY AND VERIFIED WITH: DR ADRIANA [facility] AT 9143 05/21/23.PMF     E. coli O157 NOT DETECTED NOT DETECTED    Shigella/Enteroinvasive E coli (EIEC) NOT DETECTED NOT DETECTED    Cryptosporidium NOT DETECTED NOT DETECTED    Cyclospora cayetanensis NOT DETECTED NOT DETECTED    Entamoeba histolytica NOT DETECTED NOT DETECTED    Giardia lamblia NOT DETECTED NOT DETECTED    Adenovirus F40/41 NOT DETECTED NOT DETECTED    Astrovirus NOT DETECTED NOT DETECTED    Norovirus GI/GII NOT DETECTED NOT DETECTED    Rotavirus A NOT DETECTED NOT DETECTED    Sapovirus (I, II, IV, and V) NOT DETECTED NOT DETECTED    Comment: Performed at [facility], [address] [place], [place] [address]    CBC [[id number]] Collected: 05/20/23 0442  Order Status: Completed Specimen: Blood  Updated: 05/20/23 0639   WBC 9.5 4.0 - 10.5 K/uL    RBC 4.81 4.22 - 5.81 MIL/uL    Hemoglobin 14.7 13.0 - 17.0 g/dL    HCT 57.3 60.9 - 47.9 %    MCV 88.6 80.0 - 100.0 fL    MCH 30.6 26.0 - 34.0 pg    MCHC 34.5 30.0 - 36.0 g/dL    RDW 86.3 88.4 - 84.4 %    Platelets 174 150 - 400 K/uL    nRBC 0.0 0.0 - 0.2 %    Comment: Performed at [facility], [address] [place], [place] [address]      CT ABDOMEN PELVIS W CONTRAST [[id number]] Resulted: 05/18/23 1625  Order Status: Completed Updated: 05/18/23 1627  Narrative:    CLINICAL DATA:  Epi gastric pain with nausea vomiting and diarrhea  EXAM: CT ABDOMEN AND PELVIS WITH CONTRAST  TECHNIQUE: Multidetector CT imaging of the abdomen and pelvis was performed using the standard protocol following bolus administration of intravenous contrast.  RADIATION DOSE REDUCTION: This exam was performed according to the departmental dose-optimization program which includes automated exposure control, adjustment of the mA and/or kV according to patient size and/or use of iterative reconstruction technique.  CONTRAST:  OMNIPAQUE  IOHEXOL  300 MG/ML  SOLN  COMPARISON:  CT 07/18/2006  FINDINGS: Lower chest: Lung bases demonstrate no acute airspace disease. Small hiatal hernia  Hepatobiliary: No focal liver abnormality is seen. No gallstones, gallbladder wall thickening, or biliary dilatation.  Pancreas: Unremarkable. No pancreatic ductal dilatation or surrounding inflammatory changes.  Spleen: Normal in size without focal abnormality.  Adrenals/Urinary Tract: Adrenal glands are unremarkable. Kidneys are normal, without renal calculi, focal lesion, or hydronephrosis. Bladder is unremarkable.  Stomach/Bowel: Moderate distension of the stomach. Borderline jejunal distension but with prominent jejunal and proximal ileal small bowel wall thickening and mucosal enhancement. Associated mild mesenteric stranding and congestion. No convincing evidence for obstruction. Negative appendix. Diverticular disease of the left colon  Vascular/Lymphatic: Aortic atherosclerosis. No enlarged abdominal or pelvic lymph nodes.  Reproductive: Prostate is unremarkable.  Other: No free air.  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 Multilevel degenerative  changes  IMPRESSION: 1. Prominent wall thickening and mucosal enhancement of jejunal and proximal ileal small bowel loops in the left upper quadrant with associated mesenteric congestion. Suspect infectious or inflammatory enteritis. Bowel is borderline dilated but no convincing obstruction on this exam. 2. Small volume free fluid in the upper abdomen but no free air 3. Diverticular disease of the left colon without acute inflammatory process. 4. Aortic atherosclerosis.   Electronically Signed   By: Luke Bun M.D.   On: 05/18/2023 16:25  Troponin I (High Sensitivity) [[id number]] Collected: 05/18/23 1431  Order Status: Completed Specimen: Vein Updated: 05/18/23 1530   Troponin I (High Sensitivity) 7 <18 ng/L    Comment: (NOTE) Elevated high sensitivity troponin I (hsTnI) values and significant changes across serial measurements may suggest ACS but many other chronic and acute conditions are known to elevate hsTnI results. Refer to the Links section for chest pain algorithms and additional guidance. Performed at [facility], [address] [place], [place] [address]    Magnesium  [[id number]] Collected: 05/18/23 1431  Order Status: Completed Specimen: Blood from Vein Updated: 05/18/23 1530   Magnesium  1.9 1.7 - 2.4 mg/dL    Comment: Performed at [facility], [address], [place], [place] [address]     Drug Allergies:  Allergies[1]  Physical Exam: BP 124/76 (BP Location: Left Arm, Patient Position: Sitting, Cuff Size: Normal)   Pulse 76   Temp 98.7 F (37.1 C) (Temporal)   Ht 5' 4 (1.626 m)   Wt 198 lb 12.8 oz (90.2 kg)   SpO2 97%   BMI 34.12 kg/m    Physical Exam Vitals reviewed.  Constitutional:      Appearance: Normal appearance.  HENT:     Head: Normocephalic and atraumatic.     Right Ear: Tympanic membrane normal.     Left Ear: Tympanic membrane normal.     Nose:     Comments: Bilateral nares slightly erythematous with thin clear nasal drainage  noted. Pharynx normal. Ears normal. Eyes normal.    Mouth/Throat:     Pharynx: Oropharynx is clear.  Eyes:     Conjunctiva/sclera: Conjunctivae normal.  Cardiovascular:     Rate and Rhythm: Normal rate and regular rhythm.     Heart sounds: Normal heart sounds. No murmur heard. Pulmonary:     Effort: Pulmonary effort is normal.     Breath sounds: Normal breath sounds.     Comments: Lungs clear to auscultation Musculoskeletal:        General: Normal range of motion.     Cervical back: Normal range of motion and neck supple.  Skin:    General: Skin is warm and dry.  Neurological:     Mental Status: He is alert and oriented to person, place, and time.  Psychiatric:        Mood and Affect: Mood normal.        Behavior: Behavior normal.        Thought Content: Thought content normal.        Judgment: Judgment normal.     Assessment and Plan: 1. Seasonal and perennial allergic rhinitis   2. Gastroesophageal reflux disease, unspecified whether esophagitis present     Patient Instructions  Allergic rhinitis Continue allergen avoidance measures directed toward grass pollen tree pollen, ragweed pollen, cat, dog, dust mite, cockroach, and mold as listed below Continue levocetirizine 5 mg once a day if needed for runny nose or itch.  You may take an additional dose of cetirizine  5 mg once a day if needed for breakthrough symptoms Continue Flonase  2 sprays in each nostril once a day if needed for stuffy nose Consider saline nasal rinses as needed for nasal symptoms. Use this before any medicated nasal sprays for best result Restart allergy  injections in January. Make an appointment for the third week in January for the first injection  Reflux  Continue dietary and lifestyle modifications as listed below  Continue to follow up with your gastroenterologist as recommended  Call the clinic if this treatment plan is not working well for you.  Follow up in 2 months or sooner if  needed.   Return in about 2 months (around 04/04/2024), or if symptoms worsen or fail to improve.    Thank you for the opportunity to care for this patient.  Please do not hesitate to contact me with questions.  Arlean Mutter, FNP [facility]           [1] No Known Allergies

## 2024-02-02 NOTE — Patient Instructions (Addendum)
 Allergic rhinitis Continue allergen avoidance measures directed toward grass pollen tree pollen, ragweed pollen, cat, dog, dust mite, cockroach, and mold as listed below Continue levocetirizine 5 mg once a day if needed for runny nose or itch.  You may take an additional dose of cetirizine  5 mg once a day if needed for breakthrough symptoms Continue Flonase  2 sprays in each nostril once a day if needed for stuffy nose Consider saline nasal rinses as needed for nasal symptoms. Use this before any medicated nasal sprays for best result Restart allergy  injections in January. Make an appointment for the third week in January for the first injection  Reflux  Continue dietary and lifestyle modifications as listed below  Continue to follow up with your gastroenterologist as recommended  Call the clinic if this treatment plan is not working well for you.  Follow up in 2 months or sooner if needed.  Reducing Pollen Exposure The American Academy of Allergy , Asthma and Immunology suggests the following steps to reduce your exposure to pollen during allergy  seasons. Do not hang sheets or clothing out to dry; pollen may collect on these items. Do not mow lawns or spend time around freshly cut grass; mowing stirs up pollen. Keep windows closed at night.  Keep car windows closed while driving. Minimize morning activities outdoors, a time when pollen counts are usually at their highest. Stay indoors as much as possible when pollen counts or humidity is high and on windy days when pollen tends to remain in the air longer. Use air conditioning when possible.  Many air conditioners have filters that trap the pollen spores. Use a HEPA room air filter to remove pollen form the indoor air you breathe.  Control of Mold Allergen Mold and fungi can grow on a variety of surfaces provided certain temperature and moisture conditions exist.  Outdoor molds grow on plants, decaying vegetation and soil.  The major outdoor  mold, Alternaria and Cladosporium, are found in very high numbers during hot and dry conditions.  Generally, a late Summer - Fall peak is seen for common outdoor fungal spores.  Rain will temporarily lower outdoor mold spore count, but counts rise rapidly when the rainy period ends.  The most important indoor molds are Aspergillus and Penicillium.  Dark, humid and poorly ventilated basements are ideal sites for mold growth.  The next most common sites of mold growth are the bathroom and the kitchen.  Outdoor [employer] Use air conditioning and keep windows closed Avoid exposure to decaying vegetation. Avoid leaf raking. Avoid grain handling. Consider wearing a face mask if working in moldy areas.  Indoor Mold Control Maintain humidity below 50%. Clean washable surfaces with 5% bleach solution. Remove sources e.g. Contaminated carpets.  Control of Dog or Cat Allergen Avoidance is the best way to manage a dog or cat allergy . If you have a dog or cat and are allergic to dog or cats, consider removing the dog or cat from the home. If you have a dog or cat but dont want to find it a new home, or if your family wants a pet even though someone in the household is allergic, here are some strategies that may help keep symptoms at bay:  Keep the pet out of your bedroom and restrict it to only a few rooms. Be advised that keeping the dog or cat in only one room will not limit the allergens to that room. Dont pet, hug or kiss the dog or cat; if you do, wash  your hands with soap and water . High-efficiency particulate air (HEPA) cleaners run continuously in a bedroom or living room can reduce allergen levels over time. Regular use of a high-efficiency vacuum cleaner or a central vacuum can reduce allergen levels. Giving your dog or cat a bath at least once a week can reduce airborne allergen.   Control of Dust Mite Allergen Dust mites play a major role in allergic asthma and rhinitis. They occur in  environments with high humidity wherever human skin is found. Dust mites absorb humidity from the atmosphere (ie, they do not drink) and feed on organic matter (including shed human and animal skin). Dust mites are a microscopic type of insect that you cannot see with the naked eye. High levels of dust mites have been detected from mattresses, pillows, carpets, upholstered furniture, bed covers, clothes, soft toys and any woven material. The principal allergen of the dust mite is found in its feces. A gram of dust may contain 1,000 mites and 250,000 fecal particles. Mite antigen is easily measured in the air during house cleaning activities. Dust mites do not bite and do not cause harm to humans, other than by triggering allergies/asthma.  Ways to decrease your exposure to dust mites in your home:  1. Encase mattresses, box springs and pillows with a mite-impermeable barrier or cover  2. Wash sheets, blankets and drapes weekly in hot water  (130 F) with detergent and dry them in a dryer on the hot setting.  3. Have the room cleaned frequently with a vacuum cleaner and a damp dust-mop. For carpeting or rugs, vacuuming with a vacuum cleaner equipped with a high-efficiency particulate air (HEPA) filter. The dust mite allergic individual should not be in a room which is being cleaned and should wait 1 hour after cleaning before going into the room.  4. Do not sleep on upholstered furniture (eg, couches).  5. If possible removing carpeting, upholstered furniture and drapery from the home is ideal. Horizontal blinds should be eliminated in the rooms where the person spends the most time (bedroom, study, television room). Washable vinyl, roller-type shades are optimal.  6. Remove all non-washable stuffed toys from the bedroom. Wash stuffed toys weekly like sheets and blankets above.  7. Reduce indoor humidity to less than 50%. Inexpensive humidity monitors can be purchased at most hardware stores. Do not  use a humidifier as can make the problem worse and are not recommended.  Control of Cockroach Allergen Cockroach allergen has been identified as an important cause of acute attacks of asthma, especially in urban settings.  There are fifty-five species of cockroach that exist in the [place] , however only three, the American, German and Oriental species produce allergen that can affect patients with Asthma.  Allergens can be obtained from fecal particles, egg casings and secretions from cockroaches.    Remove food sources. Reduce access to water . Seal access and entry points. Spray runways with 0.5-1% Diazinon or Chlorpyrifos Blow boric acid power under stoves and refrigerator. Place bait stations (hydramethylnon) at feeding sites.

## 2024-02-06 ENCOUNTER — Ambulatory Visit (HOSPITAL_COMMUNITY): Admission: RE | Admit: 2024-02-06 | Discharge: 2024-02-06 | Attending: Gastroenterology | Admitting: Gastroenterology

## 2024-02-06 ENCOUNTER — Encounter (HOSPITAL_COMMUNITY): Payer: Self-pay

## 2024-02-06 DIAGNOSIS — R112 Nausea with vomiting, unspecified: Secondary | ICD-10-CM | POA: Diagnosis present

## 2024-02-06 DIAGNOSIS — K219 Gastro-esophageal reflux disease without esophagitis: Secondary | ICD-10-CM

## 2024-02-06 DIAGNOSIS — R6881 Early satiety: Secondary | ICD-10-CM

## 2024-02-06 MED ORDER — TECHNETIUM TC 99M SULFUR COLLOID
2.0000 | Freq: Once | INTRAVENOUS | Status: AC | PRN
  Administered 2024-02-06: 08:00:00 2.2 via ORAL

## 2024-02-10 ENCOUNTER — Ambulatory Visit: Payer: Self-pay | Admitting: Gastroenterology

## 2024-02-10 DIAGNOSIS — K760 Fatty (change of) liver, not elsewhere classified: Secondary | ICD-10-CM

## 2024-02-10 DIAGNOSIS — R7989 Other specified abnormal findings of blood chemistry: Secondary | ICD-10-CM

## 2024-02-16 ENCOUNTER — Other Ambulatory Visit: Payer: Self-pay | Admitting: Gastroenterology

## 2024-02-20 LAB — CBC WITH DIFFERENTIAL/PLATELET
Basophils Absolute: 0 x10E3/uL (ref 0.0–0.2)
Basos: 0 %
EOS (ABSOLUTE): 0.2 x10E3/uL (ref 0.0–0.4)
Eos: 3 %
Hematocrit: 49.1 % (ref 37.5–51.0)
Hemoglobin: 16.6 g/dL (ref 13.0–17.7)
Immature Grans (Abs): 0 x10E3/uL (ref 0.0–0.1)
Immature Granulocytes: 0 %
Lymphocytes Absolute: 3 x10E3/uL (ref 0.7–3.1)
Lymphs: 39 %
MCH: 30.6 pg (ref 26.6–33.0)
MCHC: 33.8 g/dL (ref 31.5–35.7)
MCV: 91 fL (ref 79–97)
Monocytes Absolute: 0.5 x10E3/uL (ref 0.1–0.9)
Monocytes: 7 %
Neutrophils Absolute: 4 x10E3/uL (ref 1.4–7.0)
Neutrophils: 51 %
Platelets: 233 x10E3/uL (ref 150–450)
RBC: 5.42 x10E6/uL (ref 4.14–5.80)
RDW: 12.9 % (ref 11.6–15.4)
WBC: 7.7 x10E3/uL (ref 3.4–10.8)

## 2024-02-20 LAB — HEPATIC FUNCTION PANEL
ALT: 29 IU/L (ref 0–44)
AST: 29 IU/L (ref 0–40)
Albumin: 4.7 g/dL (ref 3.9–4.9)
Alkaline Phosphatase: 92 IU/L (ref 47–123)
Bilirubin Total: 0.7 mg/dL (ref 0.0–1.2)
Bilirubin, Direct: 0.22 mg/dL (ref 0.00–0.40)
Total Protein: 7.5 g/dL (ref 6.0–8.5)

## 2024-02-20 LAB — ENHANCED LIVER FIBROSIS (ELF): ELF(TM) Score: 9.97 — ABNORMAL HIGH

## 2024-03-03 ENCOUNTER — Other Ambulatory Visit: Payer: Self-pay | Admitting: Allergy & Immunology

## 2024-04-10 ENCOUNTER — Ambulatory Visit: Admitting: Family Medicine
